# Patient Record
Sex: Male | Born: 1955 | Race: White | Hispanic: No | Marital: Married | Smoking: Former smoker
Health system: Southern US, Community
[De-identification: ages and names within clinical notes are randomized; demographics above are authoritative.]

## PROBLEM LIST (undated history)

## (undated) DIAGNOSIS — N201 Calculus of ureter: Secondary | ICD-10-CM

## (undated) DIAGNOSIS — I10 Essential (primary) hypertension: Secondary | ICD-10-CM

## (undated) DIAGNOSIS — D649 Anemia, unspecified: Secondary | ICD-10-CM

## (undated) DIAGNOSIS — E785 Hyperlipidemia, unspecified: Secondary | ICD-10-CM

## (undated) DIAGNOSIS — K429 Umbilical hernia without obstruction or gangrene: Secondary | ICD-10-CM

## (undated) DIAGNOSIS — K219 Gastro-esophageal reflux disease without esophagitis: Secondary | ICD-10-CM

## (undated) DIAGNOSIS — Z87442 Personal history of urinary calculi: Secondary | ICD-10-CM

## (undated) HISTORY — PX: COLONOSCOPY: SHX174

---

## 2007-09-22 ENCOUNTER — Emergency Department (HOSPITAL_COMMUNITY): Admission: EM | Admit: 2007-09-22 | Discharge: 2007-09-23 | Payer: Self-pay | Admitting: Emergency Medicine

## 2011-08-08 ENCOUNTER — Other Ambulatory Visit: Payer: Self-pay | Admitting: Urology

## 2011-08-10 ENCOUNTER — Encounter (HOSPITAL_COMMUNITY): Payer: Self-pay | Admitting: Pharmacy Technician

## 2011-08-15 ENCOUNTER — Encounter (HOSPITAL_COMMUNITY): Payer: Self-pay | Admitting: *Deleted

## 2011-08-15 NOTE — Progress Notes (Signed)
Spoke with patient's wife. Instructed no Aspirin products x 4 days prior and to take laxative 4/3/13pm

## 2011-08-15 NOTE — Progress Notes (Signed)
Spoke with patient's wife. She said he has been having diarrhea x 2 days. She will call Dr. Margarita Grizzle if he is still having diarrhea tomorrow re: should he take the laxative and use the enema 4/3/13pm

## 2011-08-18 ENCOUNTER — Ambulatory Visit (HOSPITAL_COMMUNITY): Payer: Managed Care, Other (non HMO)

## 2011-08-18 ENCOUNTER — Encounter (HOSPITAL_COMMUNITY): Payer: Self-pay | Admitting: *Deleted

## 2011-08-18 ENCOUNTER — Ambulatory Visit (HOSPITAL_COMMUNITY)
Admission: RE | Admit: 2011-08-18 | Discharge: 2011-08-18 | Disposition: A | Discharge status: Home / Self Care | Payer: Managed Care, Other (non HMO) | Source: Ambulatory Visit | Attending: Urology | Admitting: Urology

## 2011-08-18 ENCOUNTER — Encounter (HOSPITAL_COMMUNITY)
Admission: RE | Disposition: A | Discharge status: Home / Self Care | Payer: Self-pay | Source: Ambulatory Visit | Attending: Urology

## 2011-08-18 DIAGNOSIS — I10 Essential (primary) hypertension: Secondary | ICD-10-CM | POA: Insufficient documentation

## 2011-08-18 DIAGNOSIS — E119 Type 2 diabetes mellitus without complications: Secondary | ICD-10-CM | POA: Insufficient documentation

## 2011-08-18 DIAGNOSIS — N1330 Unspecified hydronephrosis: Secondary | ICD-10-CM | POA: Insufficient documentation

## 2011-08-18 DIAGNOSIS — N201 Calculus of ureter: Secondary | ICD-10-CM | POA: Insufficient documentation

## 2011-08-18 HISTORY — PX: EXTRACORPOREAL SHOCK WAVE LITHOTRIPSY: SHX1557

## 2011-08-18 HISTORY — DX: Essential (primary) hypertension: I10

## 2011-08-18 SURGERY — LITHOTRIPSY, ESWL
Anesthesia: LOCAL | Laterality: Left

## 2011-08-18 MED ORDER — OXYCODONE-ACETAMINOPHEN 5-325 MG PO TABS: 1.0000 | ORAL_TABLET | ORAL | Status: AC | PRN

## 2011-08-18 MED ORDER — DIAZEPAM 5 MG PO TABS
ORAL_TABLET | ORAL | Status: AC
  Administered 2011-08-18: 10 mg via ORAL
  Filled 2011-08-18: qty 2

## 2011-08-18 MED ORDER — DIPHENHYDRAMINE HCL 25 MG PO CAPS
25.0000 mg | ORAL_CAPSULE | ORAL | Status: AC
  Administered 2011-08-18: 25 mg via ORAL

## 2011-08-18 MED ORDER — CEFAZOLIN SODIUM-DEXTROSE 2-3 GM-% IV SOLR
INTRAVENOUS | Status: AC
  Administered 2011-08-18: 2 g via INTRAVENOUS
  Filled 2011-08-18: qty 50

## 2011-08-18 MED ORDER — DIPHENHYDRAMINE HCL 25 MG PO CAPS
ORAL_CAPSULE | ORAL | Status: AC
  Filled 2011-08-18: qty 1

## 2011-08-18 MED ORDER — DEXTROSE-NACL 5-0.45 % IV SOLN
INTRAVENOUS | Status: DC
  Administered 2011-08-18: 15:00:00 via INTRAVENOUS

## 2011-08-18 MED ORDER — CEFAZOLIN SODIUM-DEXTROSE 2-3 GM-% IV SOLR
2.0000 g | INTRAVENOUS | Status: AC
  Administered 2011-08-18: 2 g via INTRAVENOUS

## 2011-08-18 MED ORDER — DIAZEPAM 5 MG PO TABS
10.0000 mg | ORAL_TABLET | ORAL | Status: AC
  Administered 2011-08-18: 10 mg via ORAL

## 2011-08-18 NOTE — Discharge Instructions (Signed)
DISCHARGE INSTRUCTIONS FOR SHOCKWAVE LITHOTRIPSY ° °MEDICATIONS:  ° °1. DO NOT RESUME YOUR ASPIRIN, or any other medicines like ibuprofen, motrin, excedrin, advil, aleve, vitamin E, fish oil as these can all cause bleeding x 7 days. ° °2. Resume all your other meds from. ° °ACTIVITY °1. No strenuous activity x 1week °2. No driving while on narcotic pain medications °3. Drink plenty of water °4. Continue to walk at home - you can still get blood clots when you are at home, so keep active, but don't over do it. °5. May return to work in 1-3 days. ° °BATHING °You can shower or take a bath. ° ° °SIGNS/SYMPTOMS TO CALL: °1. Please call us if you have a fever greater than 101.5, uncontrolled  °nausea/vomiting, uncontrolled pain, dizziness, unable to urinate, chest pain, shortness of breath, leg swelling, leg pain, redness around wound, drainage from wound, or any other concerns or questions. ° °You can reach us at [phone]. ° ° ° °

## 2011-08-18 NOTE — H&P (Signed)
Urology History and Physical Exam  CC: Left proximal ureter stone.  HPI: 56 year old male found to have a left proximal ureter stone during a work up for microscopic hematuria. It measures 7.6 x 9.2 mm. The stone was also visible on a KUB.  He was not having flank pain, but he did have some dyuria and urinary frequency. We discussed management options and he presents today for left SWL of the proximal ureter stone. If this stone breaks up well, we may then target the left lower pole stone. We have discussed the risks, benefits, alternatives, and likelihood of achieving goals.  Urine culture from 08/05/11 was negative.    PMH: Past Medical History  Diagnosis Date  . Hypertension   . Diabetes mellitus   . Chronic kidney disease     left ureteral stone    PSH: History reviewed. No pertinent past surgical history.  Allergies: Allergies  Allergen Reactions  . Ciprofloxacin Hives    Medications: No prescriptions prior to admission     Social History: History   Social History  . Marital Status: Married    Spouse Name: N/A    Number of Children: N/A  . Years of Education: N/A   Occupational History  . Not on file.   Social History Main Topics  . Smoking status: Former Games developer  . Smokeless tobacco: Not on file  . Alcohol Use: Yes     rare  . Drug Use: No  . Sexually Active:    Other Topics Concern  . Not on file   Social History Narrative  . No narrative on file    Family History: History reviewed. No pertinent family history.  Review of Systems: Positive: Pain in left flank. Negative: Fever, SOB, chest pain.  A further 10 point review of systems was negative except what is listed in the HPI.  Physical Exam:  General: No acute distress.  Awake. Head:  Normocephalic.  Atraumatic. ENT:  EOMI.  Mucous membranes moist Neck:  Supple.  No lymphadenopathy. CV:  S1 present. S2 present. Regular rate. Pulmonary: Equal effort bilaterally.  Clear to auscultation  bilaterally. Abdomen: Soft.  Non- tender to palpation. Skin:  Normal turgor.  No visible rash. Extremity: No gross deformity of bilateral upper extremities.  No gross deformity of    bilateral lower extremities. Neurologic: Alert. Appropriate mood.    Studies:  No results found for this basename: HGB:2,WBC:2,PLT:2 in the last 72 hours  No results found for this basename: NA:2,K:2,CL:2,CO2:2,BUN:2,CREATININE:2,CALCIUM:2,MAGNESIUM:2,GFRNONAA:2,GFRAA:2 in the last 72 hours   No results found for this basename: PT:2,INR:2,APTT:2 in the last 72 hours   No components found with this basename: ABG:2    Assessment:  Left proximal ureter stone  Plan: Shockwave lithotripsy of the left proximal ureter stone.

## 2011-08-18 NOTE — Brief Op Note (Signed)
08/18/2011  5:15 PM  PATIENT:  Austin Gould  56 y.o. male  PRE-OPERATIVE DIAGNOSIS:  LEFT URETERAL STONE  POST-OPERATIVE DIAGNOSIS: Left ureter stone  PROCEDURE:  Procedure(s) (LRB): EXTRACORPOREAL SHOCK WAVE LITHOTRIPSY (ESWL) (Left)  SURGEON:  Surgeon(s) and Role:    * Milford Cage, MD - Primary  PHYSICIAN ASSISTANT:   ASSISTANTS: none   ANESTHESIA:   IV sedation  EBL:   None  BLOOD ADMINISTERED:none  DRAINS: none   LOCAL MEDICATIONS USED:  NONE  SPECIMEN:  No Specimen  DISPOSITION OF SPECIMEN:  N/A  COUNTS:  YES  TOURNIQUET:  * No tourniquets in log *  DICTATION: .Note written in paper chart  PLAN OF CARE: Discharge to home after PACU  PATIENT DISPOSITION:  PACU - hemodynamically stable.   Delay start of Pharmacological VTE agent (>24hrs) due to surgical blood loss or risk of bleeding: yes

## 2011-08-19 ENCOUNTER — Encounter (HOSPITAL_COMMUNITY): Payer: Self-pay

## 2011-09-09 ENCOUNTER — Other Ambulatory Visit: Payer: Self-pay | Admitting: Urology

## 2011-10-13 ENCOUNTER — Encounter (HOSPITAL_BASED_OUTPATIENT_CLINIC_OR_DEPARTMENT_OTHER): Payer: Self-pay | Admitting: *Deleted

## 2011-10-13 NOTE — Progress Notes (Signed)
NPO AFTER MN. ARRIVES AT 0715. NEEDS ISTAT AND EKG. WILL TAKE PEPCID AM OF SURG  W/ SIP OF WATER.

## 2011-10-19 ENCOUNTER — Encounter (HOSPITAL_BASED_OUTPATIENT_CLINIC_OR_DEPARTMENT_OTHER): Payer: Self-pay | Admitting: Anesthesiology

## 2011-10-19 ENCOUNTER — Encounter (HOSPITAL_BASED_OUTPATIENT_CLINIC_OR_DEPARTMENT_OTHER)
Admission: RE | Disposition: A | Discharge status: Home / Self Care | Payer: Self-pay | Source: Ambulatory Visit | Attending: Urology

## 2011-10-19 ENCOUNTER — Ambulatory Visit (HOSPITAL_BASED_OUTPATIENT_CLINIC_OR_DEPARTMENT_OTHER)
Admission: RE | Admit: 2011-10-19 | Discharge: 2011-10-19 | Disposition: A | Discharge status: Home / Self Care | Payer: Managed Care, Other (non HMO) | Source: Ambulatory Visit | Attending: Urology | Admitting: Urology

## 2011-10-19 ENCOUNTER — Ambulatory Visit (HOSPITAL_BASED_OUTPATIENT_CLINIC_OR_DEPARTMENT_OTHER): Payer: Managed Care, Other (non HMO) | Admitting: Anesthesiology

## 2011-10-19 ENCOUNTER — Encounter (HOSPITAL_BASED_OUTPATIENT_CLINIC_OR_DEPARTMENT_OTHER): Payer: Self-pay | Admitting: *Deleted

## 2011-10-19 DIAGNOSIS — N200 Calculus of kidney: Secondary | ICD-10-CM | POA: Insufficient documentation

## 2011-10-19 DIAGNOSIS — Z87891 Personal history of nicotine dependence: Secondary | ICD-10-CM | POA: Insufficient documentation

## 2011-10-19 DIAGNOSIS — N201 Calculus of ureter: Secondary | ICD-10-CM | POA: Insufficient documentation

## 2011-10-19 DIAGNOSIS — K219 Gastro-esophageal reflux disease without esophagitis: Secondary | ICD-10-CM | POA: Insufficient documentation

## 2011-10-19 DIAGNOSIS — E785 Hyperlipidemia, unspecified: Secondary | ICD-10-CM | POA: Insufficient documentation

## 2011-10-19 DIAGNOSIS — I10 Essential (primary) hypertension: Secondary | ICD-10-CM | POA: Insufficient documentation

## 2011-10-19 DIAGNOSIS — E119 Type 2 diabetes mellitus without complications: Secondary | ICD-10-CM | POA: Insufficient documentation

## 2011-10-19 HISTORY — PX: CYSTOSCOPY WITH URETEROSCOPY: SHX5123

## 2011-10-19 HISTORY — DX: Gastro-esophageal reflux disease without esophagitis: K21.9

## 2011-10-19 HISTORY — DX: Hyperlipidemia, unspecified: E78.5

## 2011-10-19 HISTORY — DX: Umbilical hernia without obstruction or gangrene: K42.9

## 2011-10-19 LAB — POCT I-STAT 4, (NA,K, GLUC, HGB,HCT): Sodium: 142 mEq/L (ref 135–145)

## 2011-10-19 SURGERY — CYSTOSCOPY WITH URETEROSCOPY
Anesthesia: General | Site: Ureter | Laterality: Left | Wound class: Clean Contaminated

## 2011-10-19 MED ORDER — LIDOCAINE HCL 2 % EX GEL
CUTANEOUS | Status: DC | PRN
  Administered 2011-10-19: 1

## 2011-10-19 MED ORDER — IOHEXOL 350 MG/ML SOLN
INTRAVENOUS | Status: DC | PRN
  Administered 2011-10-19: 1 mL

## 2011-10-19 MED ORDER — FENTANYL CITRATE 0.05 MG/ML IJ SOLN
25.0000 ug | INTRAMUSCULAR | Status: DC | PRN
  Administered 2011-10-19 (×2): 25 ug via INTRAVENOUS

## 2011-10-19 MED ORDER — KETOROLAC TROMETHAMINE 30 MG/ML IJ SOLN
15.0000 mg | Freq: Once | INTRAMUSCULAR | Status: AC | PRN
  Administered 2011-10-19: 30 mg via INTRAVENOUS

## 2011-10-19 MED ORDER — SENNOSIDES-DOCUSATE SODIUM 8.6-50 MG PO TABS: 1.0000 | ORAL_TABLET | Freq: Two times a day (BID) | ORAL | Status: AC

## 2011-10-19 MED ORDER — CEPHALEXIN 500 MG PO CAPS: 500.0000 mg | ORAL_CAPSULE | Freq: Three times a day (TID) | ORAL | Status: AC

## 2011-10-19 MED ORDER — PROMETHAZINE HCL 25 MG/ML IJ SOLN: 6.2500 mg | INTRAMUSCULAR | Status: DC | PRN

## 2011-10-19 MED ORDER — CEFAZOLIN SODIUM-DEXTROSE 2-3 GM-% IV SOLR
2.0000 g | INTRAVENOUS | Status: AC
  Administered 2011-10-19: 2 g via INTRAVENOUS

## 2011-10-19 MED ORDER — PHENAZOPYRIDINE HCL 100 MG PO TABS: 100.0000 mg | ORAL_TABLET | Freq: Three times a day (TID) | ORAL | Status: AC | PRN

## 2011-10-19 MED ORDER — OXYBUTYNIN CHLORIDE 5 MG PO TABS: 5.0000 mg | ORAL_TABLET | Freq: Four times a day (QID) | ORAL | Status: DC | PRN

## 2011-10-19 MED ORDER — LIDOCAINE HCL (CARDIAC) 20 MG/ML IV SOLN
INTRAVENOUS | Status: DC | PRN
  Administered 2011-10-19: 100 mg via INTRAVENOUS

## 2011-10-19 MED ORDER — SODIUM CHLORIDE 0.9 % IR SOLN
Status: DC | PRN
  Administered 2011-10-19: 6000 mL

## 2011-10-19 MED ORDER — DEXAMETHASONE SODIUM PHOSPHATE 4 MG/ML IJ SOLN
INTRAMUSCULAR | Status: DC | PRN
  Administered 2011-10-19: 10 mg via INTRAVENOUS

## 2011-10-19 MED ORDER — FENTANYL CITRATE 0.05 MG/ML IJ SOLN
INTRAMUSCULAR | Status: DC | PRN
  Administered 2011-10-19 (×2): 50 ug via INTRAVENOUS
  Administered 2011-10-19: 25 ug via INTRAVENOUS
  Administered 2011-10-19: 50 ug via INTRAVENOUS
  Administered 2011-10-19: 25 ug via INTRAVENOUS
  Administered 2011-10-19 (×2): 50 ug via INTRAVENOUS

## 2011-10-19 MED ORDER — OXYCODONE-ACETAMINOPHEN 5-325 MG PO TABS
1.0000 | ORAL_TABLET | Freq: Once | ORAL | Status: AC
  Administered 2011-10-19: 1 via ORAL

## 2011-10-19 MED ORDER — LACTATED RINGERS IV SOLN
INTRAVENOUS | Status: DC
  Administered 2011-10-19 (×2): via INTRAVENOUS

## 2011-10-19 MED ORDER — PROPOFOL 10 MG/ML IV EMUL
INTRAVENOUS | Status: DC | PRN
  Administered 2011-10-19: 200 mg via INTRAVENOUS

## 2011-10-19 MED ORDER — MIDAZOLAM HCL 5 MG/5ML IJ SOLN
INTRAMUSCULAR | Status: DC | PRN
  Administered 2011-10-19: 2 mg via INTRAVENOUS

## 2011-10-19 MED ORDER — ONDANSETRON HCL 4 MG/2ML IJ SOLN
INTRAMUSCULAR | Status: DC | PRN
  Administered 2011-10-19: 4 mg via INTRAVENOUS

## 2011-10-19 MED ORDER — OXYCODONE-ACETAMINOPHEN 5-325 MG PO TABS: 1.0000 | ORAL_TABLET | ORAL | Status: AC | PRN

## 2011-10-19 MED ORDER — CEFAZOLIN SODIUM 1-5 GM-% IV SOLN: 1.0000 g | INTRAVENOUS | Status: DC

## 2011-10-19 SURGICAL SUPPLY — 35 items
ADAPTER CATH URET PLST 4-6FR (CATHETERS) IMPLANT
BAG DRAIN URO-CYSTO SKYTR STRL (DRAIN) ×2 IMPLANT
BASKET LASER NITINOL 1.9FR (BASKET) IMPLANT
BASKET STNLS GEMINI 4WIRE 3FR (BASKET) IMPLANT
BASKET ZERO TIP NITINOL 2.4FR (BASKET) ×2 IMPLANT
BRUSH URET BIOPSY 3F (UROLOGICAL SUPPLIES) IMPLANT
CANISTER SUCT LVC 12 LTR MEDI- (MISCELLANEOUS) ×2 IMPLANT
CATH CLEAR GEL 3F BACKSTOP (CATHETERS) ×2 IMPLANT
CATH INTERMIT  6FR 70CM (CATHETERS) IMPLANT
CATH URET 5FR 28IN CONE TIP (BALLOONS)
CATH URET 5FR 28IN OPEN ENDED (CATHETERS) IMPLANT
CATH URET 5FR 70CM CONE TIP (BALLOONS) IMPLANT
CLOTH BEACON ORANGE TIMEOUT ST (SAFETY) ×2 IMPLANT
DRAPE CAMERA CLOSED 9X96 (DRAPES) ×2 IMPLANT
ELECT REM PT RETURN 9FT ADLT (ELECTROSURGICAL)
ELECTRODE REM PT RTRN 9FT ADLT (ELECTROSURGICAL) IMPLANT
GLOVE ECLIPSE 7.0 STRL STRAW (GLOVE) ×2 IMPLANT
GLOVE INDICATOR 7.0 STRL GRN (GLOVE) ×4 IMPLANT
GLOVE INDICATOR 7.5 STRL GRN (GLOVE) ×2 IMPLANT
GOWN PREVENTION PLUS LG XLONG (DISPOSABLE) ×2 IMPLANT
GUIDEWIRE 0.038 PTFE COATED (WIRE) IMPLANT
GUIDEWIRE ANG ZIPWIRE 038X150 (WIRE) IMPLANT
GUIDEWIRE STR DUAL SENSOR (WIRE) ×2 IMPLANT
IV NS IRRIG 3000ML ARTHROMATIC (IV SOLUTION) ×4 IMPLANT
KIT BALLIN UROMAX 15FX10 (LABEL) IMPLANT
KIT BALLN UROMAX 15FX4 (MISCELLANEOUS) IMPLANT
KIT BALLN UROMAX 26 75X4 (MISCELLANEOUS)
LASER FIBER DISP (UROLOGICAL SUPPLIES) ×2 IMPLANT
PACK CYSTOSCOPY (CUSTOM PROCEDURE TRAY) ×2 IMPLANT
SET HIGH PRES BAL DIL (LABEL)
SHEATH ACCESS URETERAL 38CM (SHEATH) ×2 IMPLANT
SHEATH URET ACCESS 12FR/35CM (UROLOGICAL SUPPLIES) IMPLANT
SHEATH URET ACCESS 12FR/55CM (UROLOGICAL SUPPLIES) IMPLANT
STENT URET 6FRX26 CONTOUR (STENTS) ×2 IMPLANT
SYRINGE IRR TOOMEY STRL 70CC (SYRINGE) IMPLANT

## 2011-10-19 NOTE — Discharge Instructions (Signed)
DISCHARGE INSTRUCTIONS FOR KIDNEY STONES OR URETERAL STENT  MIEDICATION:  You will have an antibiotic. Start this the day of surgery and continue it for 3 days with the last day being Friday, 10-21-11.  Restart the antibiotic on Wednesday, 10-26-11.  ACTIVITY 1. No strenuous activity x 1week 2. No driving while on narcotic pain medications 3. Drink plenty of water 4. Continue to walk at home - you can still get blood clots when you are at home, so keep active, but don't over do it. 5. May return to work in 3 days.  BATHING 1. You can shower.   SIGNS/SYMPTOMS TO CALL: 1. Please call us if you have a fever greater than 101.5, uncontrolled  nausea/vomiting, uncontrolled pain, dizziness, unable to urinate, chest pain, shortness of breath, leg swelling, leg pain, redness around wound, drainage from wound, or any other concerns or questions.  You can reach [place] at [phone].   Post Anesthesia Home Care Instructions  Activity: Get plenty of rest for the remainder of the day. A responsible adult should stay with you for 24 hours following the procedure.  For the next 24 hours, DO NOT: -Drive a car -Advertising copywriter -Drink alcoholic beverages -Take any medication unless instructed by your physician -Make any legal decisions or sign important papers.  Meals: Start with liquid foods such as gelatin or soup. Progress to regular foods as tolerated. Avoid greasy, spicy, heavy foods. If nausea and/or vomiting occur, drink only clear liquids until the nausea and/or vomiting subsides. Call your physician if vomiting continues.  Special Instructions/Symptoms: Your throat may feel dry or sore from the anesthesia or the breathing tube placed in your throat during surgery. If this causes discomfort, gargle with warm salt water. The discomfort should disappear within 24 hours.

## 2011-10-19 NOTE — Anesthesia Procedure Notes (Signed)
Procedure Name: LMA Insertion Date/Time: 10/19/2011 8:40 AM Performed by: Norva Pavlov Pre-anesthesia Checklist: Patient identified, Emergency Drugs available, Suction available and Patient being monitored Patient Re-evaluated:Patient Re-evaluated prior to inductionOxygen Delivery Method: Circle System Utilized Preoxygenation: Pre-oxygenation with 100% oxygen Intubation Type: IV induction Ventilation: Mask ventilation without difficulty LMA: LMA inserted LMA Size: 4.0 Number of attempts: 1 Airway Equipment and Method: bite block Placement Confirmation: positive ETCO2 Tube secured with: Tape Dental Injury: Teeth and Oropharynx as per pre-operative assessment

## 2011-10-19 NOTE — Anesthesia Postprocedure Evaluation (Signed)
  Anesthesia Post-op Note  Patient: Austin Gould  Procedure(s) Performed: Procedure(s) (LRB): CYSTOSCOPY WITH URETEROSCOPY (Left) HOLMIUM LASER APPLICATION (Left)  Patient Location: PACU  Anesthesia Type: General  Level of Consciousness: awake and alert   Airway and Oxygen Therapy: Patient Spontanous Breathing  Post-op Pain: mild  Post-op Assessment: Post-op Vital signs reviewed, Patient's Cardiovascular Status Stable, Respiratory Function Stable, Patent Airway and No signs of Nausea or vomiting  Post-op Vital Signs: stable  Complications: No apparent anesthesia complications

## 2011-10-19 NOTE — Transfer of Care (Signed)
Immediate Anesthesia Transfer of Care Note  Patient: Austin Gould  Procedure(s) Performed: Procedure(s) (LRB): CYSTOSCOPY WITH URETEROSCOPY (Left) HOLMIUM LASER APPLICATION (Left)  Patient Location: PACU  Anesthesia Type: General  Level of Consciousness: drowsy, arouses to name, follows commands  Airway & Oxygen Therapy: Patient Spontanous Breathing and Patient connected to face mask oxygen  Post-op Assessment: Report given to PACU RN and Post -op Vital signs reviewed and stable  Post vital signs: Reviewed and stable  Complications: No apparent anesthesia complications

## 2011-10-19 NOTE — Brief Op Note (Signed)
10/19/2011  10:02 AM  PATIENT:  Austin Gould  56 y.o. male  PRE-OPERATIVE DIAGNOSIS:  Left Ureter Stone  POST-OPERATIVE DIAGNOSIS:  Left Ureter Stone  PROCEDURE:  Procedure(s) (LRB): CYSTOSCOPY  Left URETEROSCOPY HOLMIUM LASER Lithotripsy Left ureter stent placement Fluoroscopy  SURGEON:  Surgeon(s) and Role:    * Milford Cage, MD - Primary  PHYSICIAN ASSISTANT:   ASSISTANTS: none   ANESTHESIA:   general  EBL: None Total I/O In: 1000 [I.V.:1000] Out: -   BLOOD ADMINISTERED:none  DRAINS: none   LOCAL MEDICATIONS USED:  Amount: 10 ml lidocaine urethral jelly.  SPECIMEN:  Source of Specimen:  left ureter and left renal stone.  DISPOSITION OF SPECIMEN:  Given to family.  COUNTS:  YES  TOURNIQUET:  * No tourniquets in log *  DICTATION: .Other Dictation: Dictation Number [id number]  PLAN OF CARE: Discharge to home after PACU  PATIENT DISPOSITION:  PACU - hemodynamically stable.   Delay start of Pharmacological VTE agent (>24hrs) due to surgical blood loss or risk of bleeding: no

## 2011-10-19 NOTE — H&P (Signed)
Urology History and Physical Exam  CC: Left proximal ureter stone.  HPI:    56 year old male presents for left ureteroscopy for left ureter stone. It was 1.5 cm in size and was located in the proximal ureter. He had SWL 08/18/11, which decreased the size of the stone, but it remained intact.  We discussed the risks, benefits, alternatives, and likelihood of achieving those with ureteroscopy. UA at last visit was negative for signs of infection.   PMH: Past Medical History  Diagnosis Date  . Hypertension   . Diabetes mellitus   . Left ureteral calculus   . Hyperlipidemia   . GERD (gastroesophageal reflux disease)   . Umbilical hernia   . Microhematuria     PSH: Past Surgical History  Procedure Date  . Extracorporeal shock wave lithotripsy 08-18-2011    LEFT    Allergies: Allergies  Allergen Reactions  . Ciprofloxacin Hives    Medications: No prescriptions prior to admission     Social History: History   Social History  . Marital Status: Married    Spouse Name: N/A    Number of Children: N/A  . Years of Education: N/A   Occupational History  . Not on file.   Social History Main Topics  . Smoking status: Former Smoker -- 25 years  . Smokeless tobacco: Never Used   Comment: occasional smoker as teen  . Alcohol Use: Yes     rare  . Drug Use: No  . Sexually Active:    Other Topics Concern  . Not on file   Social History Narrative  . No narrative on file    Family History: History reviewed. No pertinent family history.  Review of Systems: Positive: Diarrhea, nausea. Negative: Chest pain, SOB, fever.  A further 10 point review of systems was negative except what is listed in the HPI.  Physical Exam:  General: No acute distress.  Awake. Head:  Normocephalic.  Atraumatic. ENT:  EOMI.  Mucous membranes moist Neck:  Supple.  No lymphadenopathy. CV:  S1 present. S2 present. Regular rate. Pulmonary: Equal effort bilaterally.  Clear to auscultation  bilaterally. Abdomen: Soft.  Non- tender to palpation. Skin:  Normal turgor.  No visible rash. Extremity: No gross deformity of bilateral upper extremities.  No gross deformity of    bilateral lower extremities. Neurologic: Alert. Appropriate mood.  Studies:  No results found for this basename: HGB:2,WBC:2,PLT:2 in the last 72 hours  No results found for this basename: NA:2,K:2,CL:2,CO2:2,BUN:2,CREATININE:2,CALCIUM:2,MAGNESIUM:2,GFRNONAA:2,GFRAA:2 in the last 72 hours   No results found for this basename: PT:2,INR:2,APTT:2 in the last 72 hours   No components found with this basename: ABG:2    Assessment:  Left proximal ureter stone.  Plan: -To OR for cystoscopy, left ureteroscopy, laser lithotripsy, possible left ureter stent placement.

## 2011-10-19 NOTE — Anesthesia Preprocedure Evaluation (Addendum)
Anesthesia Evaluation  Patient identified by MRN, date of birth, ID band Patient awake    Reviewed: Allergy & Precautions, H&P , NPO status , Patient's Chart, lab work & pertinent test results  Airway Mallampati: II TM Distance: <3 FB Neck ROM: Full    Dental No notable dental hx.    Pulmonary neg pulmonary ROS,  breath sounds clear to auscultation  Pulmonary exam normal       Cardiovascular hypertension, Pt. on medications Rhythm:Regular Rate:Normal     Neuro/Psych negative neurological ROS  negative psych ROS   GI/Hepatic negative GI ROS, Neg liver ROS,   Endo/Other  Diabetes mellitus-, Type 2, Oral Hypoglycemic Agents  Renal/GU negative Renal ROS  negative genitourinary   Musculoskeletal negative musculoskeletal ROS (+)   Abdominal   Peds negative pediatric ROS (+)  Hematology negative hematology ROS (+)   Anesthesia Other Findings   Reproductive/Obstetrics negative OB ROS                          Anesthesia Physical Anesthesia Plan  ASA: II  Anesthesia Plan: General   Post-op Pain Management:    Induction: Intravenous  Airway Management Planned: LMA  Additional Equipment:   Intra-op Plan:   Post-operative Plan:   Informed Consent: I have reviewed the patients History and Physical, chart, labs and discussed the procedure including the risks, benefits and alternatives for the proposed anesthesia with the patient or authorized representative who has indicated his/her understanding and acceptance.   Dental advisory given  Plan Discussed with: CRNA  Anesthesia Plan Comments:         Anesthesia Quick Evaluation

## 2011-10-20 NOTE — Op Note (Signed)
NAME:  Austin Gould, Austin Gould NO.:  [id number]  MEDICAL RECORD NO.:  [id number]  LOCATION:                               FACILITY:  [facility]  PHYSICIAN:  Natalia Leatherwood, MD    DATE OF BIRTH:  [birth year]  DATE OF PROCEDURE:10/19/2011 DATE OF DISCHARGE:                              OPERATIVE REPORT   SURGEON:  Natalia Leatherwood, MD.  ASSISTANT:  None.  PREOPERATIVE DIAGNOSES:  Left ureteral stone and left nephrolithiasis.  POSTOPERATIVE DIAGNOSES:  Left ureteral stone and left nephrolithiasis.  PROCEDURE PERFORMED: 1. Cystoscopy. 2. Left ureteroscopy. 3. Laser lithotripsy. 4. Basket stone retrieval. 5. Left ureteral stent placement. 6. Fluoroscopy with interpretation less than 1 hour.  COMPLICATIONS:  None.  ESTIMATED BLOOD LOSS:  None.  FINDINGS:  Impacted left proximal ureteral stone and left lower pole renal stone.  HISTORY OF PRESENT ILLNESS:  This is a 56 year old gentleman who was found to have a left proximal ureteral stone.  He underwent shockwave lithotripsy; however, the stone remained in place and we discussed further treatment options.  He elected to have ureteroscopy and presents today for that elective procedure.  PROCEDURE:  Informed consent was obtained.  The patient was taken to the operating room.  He was placed in a supine position.  SCDs were placed on his legs and turned on.  IV antibiotics were infused and general anesthesia was induced.  He was then placed in a dorsal lithotomy position making sure to pad all pertinent neurovascular pressure points appropriately.  Following this, his genitals were prepped and draped in usual sterile fashion.  A time-out was performed, which the correct patient, surgical site, and procedure were all identified and agreed upon by the team.  Rigid cystoscope was then advanced through the urethra into the bladder.  The left ureteral orifice was immediately visible and it was cannulated with a sensor tip wire.   The wire was able to be placed past the stone, which was visible on fluoroscopy and with a good curl in the renal pelvis.  A second wire was more difficult to place past the stone, but eventually was able to be passed beyond the stone with a curl in the renal pelvis.  After this was done, the bladder was drained.  One wire was secured as a safety wire, the other was used as a working wire.  A 12-14-French ureteral access sheath was then placed under fluoroscopic guidance with ease up to the stone.  The obturator and wire were removed and then the digital ureteroscope was placed up the access sheath.  I had wanted to use BackStop Gel to place beyond the stone to prevent fragments from going into the kidney; however, the BackStop Gel tube would not fit around the stone because it was so tight.  Therefore, 200 micron holmium laser filament was placed through the ureteroscope and lithotripsy was carried out at 0.5 joules and 20 Hz.  As the stone broke up, I removed fragments with a ZeroTip Nitinol basket.  Further lithotripsy was performed in order to break up the entirety of the stone.  It was noted that the stone was impacted in the ureter with large amount of edema  around the impaction site.  As lithotripsy was carried out, the stone fragments were freed and I cleared the obstruction.  It was noted that the safety wire was submucosal in the ureter for a very short distance, approximately 1 cm. The wire was in the true lumen of the ureter beyond this 1 cm and into the kidney.  Next, I took the ureteroscope into the kidney and evaluated all of the calyces in a systematic fashion.  In the lower pole of the kidney, I found the remaining 7 mm stone and lithotripsy was carried out upon the stone.  The ZeroTip Nitinol basket was used to remove all fragments that were larger than the size of the sensor tip wire.  It remained a large amount of stone debris that were all smaller than the wire that  were unable to be grasped with the ZeroTip Nitinol basket. After this was done, a sensor tip wire was placed through the ureteroscope into the kidney and then the ureteroscope and ureteral access sheath were removed slowly visualizing the entirety of the ureter.  There was no injury to the mucosa of the ureter noted upon Removal other than the short submucosal tunnel from the wire in the proximal ureter.  After this, the original safety wires were removed leaving the wire in place that was placed through the ureteroscope.  This wire was backloaded onto the cystoscope and a 6 x 26 double-J ureteral stent without the strings in place was placed up over the wire under fluoroscopy and direct visualization with good curl noted in the left renal pelvis and a good curl deployed into the bladder.  The bladder was then drained and 10 cc of lidocaine jelly were placed into the urethra. A B&O suppository was placed into his rectum.  This completed the procedure.  He was placed back in supine position.  The stone fragments were removed and provided to his wife.  The patient will be taken to PACU in stable condition.  He will be discharged to home.          ______________________________ Natalia Leatherwood, MD     DW/MEDQ  D:  10/19/2011  T:  10/20/2011  Job:  [id number]

## 2011-10-24 ENCOUNTER — Encounter (HOSPITAL_BASED_OUTPATIENT_CLINIC_OR_DEPARTMENT_OTHER): Payer: Self-pay | Admitting: Urology

## 2011-10-25 ENCOUNTER — Encounter (HOSPITAL_BASED_OUTPATIENT_CLINIC_OR_DEPARTMENT_OTHER): Payer: Self-pay

## 2016-10-04 ENCOUNTER — Other Ambulatory Visit: Payer: Self-pay | Admitting: Urology

## 2016-10-04 ENCOUNTER — Encounter (HOSPITAL_COMMUNITY): Payer: Self-pay

## 2016-10-04 ENCOUNTER — Inpatient Hospital Stay (HOSPITAL_COMMUNITY): Payer: Managed Care, Other (non HMO) | Admitting: Anesthesiology

## 2016-10-04 ENCOUNTER — Encounter (HOSPITAL_COMMUNITY)
Admission: EM | Disposition: A | Discharge status: Home / Self Care | Payer: Self-pay | Source: Home / Self Care | Attending: Emergency Medicine

## 2016-10-04 ENCOUNTER — Inpatient Hospital Stay (HOSPITAL_COMMUNITY): Payer: Managed Care, Other (non HMO)

## 2016-10-04 ENCOUNTER — Observation Stay (HOSPITAL_COMMUNITY)
Admission: EM | Admit: 2016-10-04 | Discharge: 2016-10-05 | Disposition: A | Discharge status: Home / Self Care | Payer: Managed Care, Other (non HMO) | Attending: Internal Medicine | Admitting: Internal Medicine

## 2016-10-04 ENCOUNTER — Emergency Department (HOSPITAL_COMMUNITY): Payer: Managed Care, Other (non HMO)

## 2016-10-04 DIAGNOSIS — Z881 Allergy status to other antibiotic agents status: Secondary | ICD-10-CM | POA: Insufficient documentation

## 2016-10-04 DIAGNOSIS — N132 Hydronephrosis with renal and ureteral calculous obstruction: Principal | ICD-10-CM | POA: Insufficient documentation

## 2016-10-04 DIAGNOSIS — Z87442 Personal history of urinary calculi: Secondary | ICD-10-CM | POA: Insufficient documentation

## 2016-10-04 DIAGNOSIS — I10 Essential (primary) hypertension: Secondary | ICD-10-CM | POA: Diagnosis not present

## 2016-10-04 DIAGNOSIS — K219 Gastro-esophageal reflux disease without esophagitis: Secondary | ICD-10-CM | POA: Diagnosis not present

## 2016-10-04 DIAGNOSIS — E785 Hyperlipidemia, unspecified: Secondary | ICD-10-CM | POA: Diagnosis not present

## 2016-10-04 DIAGNOSIS — Z9889 Other specified postprocedural states: Secondary | ICD-10-CM | POA: Diagnosis not present

## 2016-10-04 DIAGNOSIS — K429 Umbilical hernia without obstruction or gangrene: Secondary | ICD-10-CM | POA: Diagnosis not present

## 2016-10-04 DIAGNOSIS — N201 Calculus of ureter: Secondary | ICD-10-CM

## 2016-10-04 DIAGNOSIS — Z87891 Personal history of nicotine dependence: Secondary | ICD-10-CM | POA: Insufficient documentation

## 2016-10-04 DIAGNOSIS — N179 Acute kidney failure, unspecified: Secondary | ICD-10-CM | POA: Diagnosis not present

## 2016-10-04 DIAGNOSIS — Z7984 Long term (current) use of oral hypoglycemic drugs: Secondary | ICD-10-CM | POA: Diagnosis not present

## 2016-10-04 DIAGNOSIS — Z8041 Family history of malignant neoplasm of ovary: Secondary | ICD-10-CM | POA: Diagnosis not present

## 2016-10-04 DIAGNOSIS — Z7982 Long term (current) use of aspirin: Secondary | ICD-10-CM | POA: Diagnosis not present

## 2016-10-04 DIAGNOSIS — K760 Fatty (change of) liver, not elsewhere classified: Secondary | ICD-10-CM | POA: Insufficient documentation

## 2016-10-04 DIAGNOSIS — E119 Type 2 diabetes mellitus without complications: Secondary | ICD-10-CM | POA: Insufficient documentation

## 2016-10-04 DIAGNOSIS — N400 Enlarged prostate without lower urinary tract symptoms: Secondary | ICD-10-CM | POA: Insufficient documentation

## 2016-10-04 DIAGNOSIS — Z882 Allergy status to sulfonamides status: Secondary | ICD-10-CM | POA: Insufficient documentation

## 2016-10-04 HISTORY — PX: CYSTOSCOPY W/ URETERAL STENT PLACEMENT: SHX1429

## 2016-10-04 HISTORY — DX: Calculus of ureter: N20.1

## 2016-10-04 HISTORY — DX: Personal history of urinary calculi: Z87.442

## 2016-10-04 LAB — I-STAT CHEM 8, ED
BUN: 22 mg/dL — ABNORMAL HIGH (ref 6–20)
Calcium, Ion: 1.23 mmol/L (ref 1.15–1.40)
Chloride: 101 mmol/L (ref 101–111)
Creatinine, Ser: 1.6 mg/dL — ABNORMAL HIGH (ref 0.61–1.24)
Glucose, Bld: 249 mg/dL — ABNORMAL HIGH (ref 65–99)
HCT: 38 % — ABNORMAL LOW (ref 39.0–52.0)
Hemoglobin: 12.9 g/dL — ABNORMAL LOW (ref 13.0–17.0)
Potassium: 4.6 mmol/L (ref 3.5–5.1)
Sodium: 137 mmol/L (ref 135–145)
TCO2: 23 mmol/L (ref 0–100)

## 2016-10-04 LAB — CBC
HCT: 40.3 % (ref 39.0–52.0)
Hemoglobin: 12.8 g/dL — ABNORMAL LOW (ref 13.0–17.0)
MCH: 26.6 pg (ref 26.0–34.0)
MCHC: 31.8 g/dL (ref 30.0–36.0)
MCV: 83.6 fL (ref 78.0–100.0)
PLATELETS: 287 10*3/uL (ref 150–400)
RBC: 4.82 MIL/uL (ref 4.22–5.81)
RDW: 13.7 % (ref 11.5–15.5)
WBC: 13.3 10*3/uL — AB (ref 4.0–10.5)

## 2016-10-04 LAB — URINALYSIS, ROUTINE W REFLEX MICROSCOPIC
BACTERIA UA: NONE SEEN
BILIRUBIN URINE: NEGATIVE
Glucose, UA: 50 mg/dL — AB
Ketones, ur: NEGATIVE mg/dL
NITRITE: NEGATIVE
Protein, ur: NEGATIVE mg/dL
Specific Gravity, Urine: 1.011 (ref 1.005–1.030)
pH: 5 (ref 5.0–8.0)

## 2016-10-04 LAB — GLUCOSE, CAPILLARY
GLUCOSE-CAPILLARY: 185 mg/dL — AB (ref 65–99)
GLUCOSE-CAPILLARY: 196 mg/dL — AB (ref 65–99)
GLUCOSE-CAPILLARY: 198 mg/dL — AB (ref 65–99)
Glucose-Capillary: 224 mg/dL — ABNORMAL HIGH (ref 65–99)

## 2016-10-04 LAB — COMPREHENSIVE METABOLIC PANEL
ALT: 18 U/L (ref 17–63)
AST: 19 U/L (ref 15–41)
Albumin: 3.9 g/dL (ref 3.5–5.0)
Alkaline Phosphatase: 35 U/L — ABNORMAL LOW (ref 38–126)
Anion gap: 11 (ref 5–15)
BUN: 21 mg/dL — ABNORMAL HIGH (ref 6–20)
CALCIUM: 9.5 mg/dL (ref 8.9–10.3)
CO2: 24 mmol/L (ref 22–32)
CREATININE: 1.89 mg/dL — AB (ref 0.61–1.24)
Chloride: 100 mmol/L — ABNORMAL LOW (ref 101–111)
GFR, EST AFRICAN AMERICAN: 43 mL/min — AB (ref >60)
GFR, EST NON AFRICAN AMERICAN: 37 mL/min — AB (ref >60)
Glucose, Bld: 253 mg/dL — ABNORMAL HIGH (ref 65–99)
Potassium: 4.2 mmol/L (ref 3.5–5.1)
Sodium: 135 mmol/L (ref 135–145)
Total Bilirubin: 0.7 mg/dL (ref 0.3–1.2)
Total Protein: 7 g/dL (ref 6.5–8.1)

## 2016-10-04 LAB — LIPASE, BLOOD: LIPASE: 27 U/L (ref 11–51)

## 2016-10-04 SURGERY — CYSTOSCOPY, WITH RETROGRADE PYELOGRAM AND URETERAL STENT INSERTION
Anesthesia: General | Laterality: Bilateral

## 2016-10-04 MED ORDER — FENTANYL CITRATE (PF) 100 MCG/2ML IJ SOLN
50.0000 ug | INTRAMUSCULAR | Status: DC | PRN
  Administered 2016-10-04: 50 ug via INTRAVENOUS
  Filled 2016-10-04: qty 2

## 2016-10-04 MED ORDER — PROPOFOL 10 MG/ML IV BOLUS
INTRAVENOUS | Status: AC
  Filled 2016-10-04: qty 20

## 2016-10-04 MED ORDER — HYDRALAZINE HCL 20 MG/ML IJ SOLN: 10.0000 mg | Freq: Four times a day (QID) | INTRAMUSCULAR | Status: DC | PRN

## 2016-10-04 MED ORDER — INSULIN ASPART 100 UNIT/ML ~~LOC~~ SOLN
0.0000 [IU] | Freq: Three times a day (TID) | SUBCUTANEOUS | Status: DC
  Administered 2016-10-04 – 2016-10-05 (×2): 2 [IU] via SUBCUTANEOUS

## 2016-10-04 MED ORDER — PANTOPRAZOLE SODIUM 40 MG IV SOLR
40.0000 mg | INTRAVENOUS | Status: DC
Start: 2016-10-04 — End: 2016-10-05
  Administered 2016-10-04: 40 mg via INTRAVENOUS
  Filled 2016-10-04: qty 40

## 2016-10-04 MED ORDER — 0.9 % SODIUM CHLORIDE (POUR BTL) OPTIME
TOPICAL | Status: DC | PRN
  Administered 2016-10-04: 1000 mL

## 2016-10-04 MED ORDER — FENTANYL CITRATE (PF) 100 MCG/2ML IJ SOLN
INTRAMUSCULAR | Status: DC | PRN
  Administered 2016-10-04 (×2): 50 ug via INTRAVENOUS

## 2016-10-04 MED ORDER — ACETAMINOPHEN 325 MG PO TABS: 650.0000 mg | ORAL_TABLET | Freq: Four times a day (QID) | ORAL | Status: DC | PRN

## 2016-10-04 MED ORDER — HYDROMORPHONE HCL 1 MG/ML IJ SOLN
0.5000 mg | Freq: Once | INTRAMUSCULAR | Status: AC
  Administered 2016-10-04: 0.5 mg via INTRAVENOUS
  Filled 2016-10-04: qty 0.5

## 2016-10-04 MED ORDER — SUGAMMADEX SODIUM 500 MG/5ML IV SOLN
INTRAVENOUS | Status: AC
  Filled 2016-10-04: qty 5

## 2016-10-04 MED ORDER — MIDAZOLAM HCL 5 MG/5ML IJ SOLN
INTRAMUSCULAR | Status: DC | PRN
  Administered 2016-10-04: 2 mg via INTRAVENOUS

## 2016-10-04 MED ORDER — HYDROMORPHONE HCL 1 MG/ML IJ SOLN: 0.5000 mg | INTRAMUSCULAR | Status: DC | PRN

## 2016-10-04 MED ORDER — LACTATED RINGERS IV SOLN: INTRAVENOUS | Status: DC | PRN

## 2016-10-04 MED ORDER — SENNOSIDES-DOCUSATE SODIUM 8.6-50 MG PO TABS: 1.0000 | ORAL_TABLET | Freq: Every evening | ORAL | Status: DC | PRN

## 2016-10-04 MED ORDER — SODIUM CHLORIDE 0.9 % IV SOLN
INTRAVENOUS | Status: DC | PRN
  Administered 2016-10-04: 10:00:00 via INTRAVENOUS

## 2016-10-04 MED ORDER — ONDANSETRON HCL 4 MG/2ML IJ SOLN: 4.0000 mg | Freq: Four times a day (QID) | INTRAMUSCULAR | Status: DC | PRN

## 2016-10-04 MED ORDER — PROPOFOL 10 MG/ML IV BOLUS
INTRAVENOUS | Status: DC | PRN
  Administered 2016-10-04: 200 mg via INTRAVENOUS

## 2016-10-04 MED ORDER — SODIUM CHLORIDE 0.9 % IV SOLN
INTRAVENOUS | Status: DC
  Administered 2016-10-04 – 2016-10-05 (×2): via INTRAVENOUS

## 2016-10-04 MED ORDER — OXYCODONE HCL 5 MG PO TABS
5.0000 mg | ORAL_TABLET | ORAL | Status: DC | PRN
  Administered 2016-10-04 – 2016-10-05 (×3): 5 mg via ORAL
  Filled 2016-10-04 (×3): qty 1

## 2016-10-04 MED ORDER — LIDOCAINE 2% (20 MG/ML) 5 ML SYRINGE
INTRAMUSCULAR | Status: DC | PRN
  Administered 2016-10-04: 75 mg via INTRAVENOUS

## 2016-10-04 MED ORDER — ONDANSETRON HCL 4 MG PO TABS: 4.0000 mg | ORAL_TABLET | Freq: Four times a day (QID) | ORAL | Status: DC | PRN

## 2016-10-04 MED ORDER — KETOROLAC TROMETHAMINE 30 MG/ML IJ SOLN: 30.0000 mg | Freq: Once | INTRAMUSCULAR | Status: DC | PRN

## 2016-10-04 MED ORDER — HYDROMORPHONE HCL 1 MG/ML IJ SOLN: 0.5000 mg | Freq: Once | INTRAMUSCULAR | Status: DC

## 2016-10-04 MED ORDER — HYDROMORPHONE HCL 1 MG/ML IJ SOLN
1.0000 mg | Freq: Once | INTRAMUSCULAR | Status: AC
  Administered 2016-10-04: 1 mg via INTRAVENOUS
  Filled 2016-10-04: qty 1

## 2016-10-04 MED ORDER — DEXTROSE 5 % IV SOLN
1.0000 g | INTRAVENOUS | Status: DC
  Administered 2016-10-05: 1 g via INTRAVENOUS
  Filled 2016-10-04: qty 10

## 2016-10-04 MED ORDER — FENTANYL CITRATE (PF) 100 MCG/2ML IJ SOLN: 25.0000 ug | INTRAMUSCULAR | Status: DC | PRN

## 2016-10-04 MED ORDER — PROMETHAZINE HCL 25 MG/ML IJ SOLN: 6.2500 mg | INTRAMUSCULAR | Status: DC | PRN

## 2016-10-04 MED ORDER — ONDANSETRON HCL 4 MG/2ML IJ SOLN
INTRAMUSCULAR | Status: DC | PRN
  Administered 2016-10-04: 4 mg via INTRAVENOUS

## 2016-10-04 MED ORDER — SODIUM CHLORIDE 0.9 % IV BOLUS (SEPSIS)
1000.0000 mL | Freq: Once | INTRAVENOUS | Status: AC
  Administered 2016-10-04: 1000 mL via INTRAVENOUS

## 2016-10-04 MED ORDER — DEXTROSE 5 % IV SOLN
1.0000 g | Freq: Once | INTRAVENOUS | Status: AC
  Administered 2016-10-04: 1 g via INTRAVENOUS
  Filled 2016-10-04: qty 10

## 2016-10-04 MED ORDER — ACETAMINOPHEN 650 MG RE SUPP: 650.0000 mg | Freq: Four times a day (QID) | RECTAL | Status: DC | PRN

## 2016-10-04 MED ORDER — ALBUTEROL SULFATE (2.5 MG/3ML) 0.083% IN NEBU: 2.5000 mg | INHALATION_SOLUTION | RESPIRATORY_TRACT | Status: DC | PRN

## 2016-10-04 MED ORDER — ONDANSETRON HCL 4 MG/2ML IJ SOLN
4.0000 mg | Freq: Once | INTRAMUSCULAR | Status: AC
  Administered 2016-10-04: 4 mg via INTRAVENOUS
  Filled 2016-10-04: qty 2

## 2016-10-04 MED ORDER — SODIUM CHLORIDE 0.9 % IV SOLN: INTRAVENOUS | Status: DC

## 2016-10-04 MED ORDER — FENTANYL CITRATE (PF) 100 MCG/2ML IJ SOLN
INTRAMUSCULAR | Status: AC
  Filled 2016-10-04: qty 2

## 2016-10-04 MED ORDER — MIDAZOLAM HCL 2 MG/2ML IJ SOLN
INTRAMUSCULAR | Status: AC
  Filled 2016-10-04: qty 2

## 2016-10-04 MED ORDER — IOHEXOL 300 MG/ML  SOLN
INTRAMUSCULAR | Status: DC | PRN
  Administered 2016-10-04: 50 mL via INTRAVENOUS

## 2016-10-04 MED ORDER — ONDANSETRON 4 MG PO TBDP: 4.0000 mg | ORAL_TABLET | Freq: Once | ORAL | Status: DC | PRN

## 2016-10-04 MED ORDER — SODIUM CHLORIDE 0.9 % IR SOLN
Status: DC | PRN
  Administered 2016-10-04: 4000 mL

## 2016-10-04 SURGICAL SUPPLY — 12 items
BAG URO CATCHER STRL LF (MISCELLANEOUS) ×2 IMPLANT
CATH INTERMIT  6FR 70CM (CATHETERS) ×2 IMPLANT
CLOTH BEACON ORANGE TIMEOUT ST (SAFETY) ×2 IMPLANT
COVER SURGICAL LIGHT HANDLE (MISCELLANEOUS) ×2 IMPLANT
GLOVE BIOGEL M 8.0 STRL (GLOVE) ×2 IMPLANT
GOWN STRL REUS W/ TWL XL LVL3 (GOWN DISPOSABLE) ×1 IMPLANT
GOWN STRL REUS W/TWL XL LVL3 (GOWN DISPOSABLE) ×1
GUIDEWIRE STR DUAL SENSOR (WIRE) ×2 IMPLANT
MANIFOLD NEPTUNE II (INSTRUMENTS) ×2 IMPLANT
PACK CYSTO (CUSTOM PROCEDURE TRAY) ×2 IMPLANT
STENT URET 6FRX24 CONTOUR (STENTS) ×4 IMPLANT
TUBING CONNECTING 10 (TUBING) ×2 IMPLANT

## 2016-10-04 NOTE — ED Provider Notes (Signed)
[facility]-EMERGENCY DEPT Provider Note   CSN: [id number] Arrival date & time: 10/04/16  0421     History   Chief Complaint Chief Complaint  Patient presents with  . Flank Pain    HPI Austin Gould is a 61 y.o. male with past medical history DM, GERD, HLD, HTN, with L ureteral calculus with chief complaint  acute onset left lower quadrant and right flank pain which began acutely 2.5 hours ago. Patient states that pain is severe, constant and sharp. He initially thought this pain was due to indigestion and took Nexium 40 mg which was not helpful. Nothing worsens or improves his pain. He also endorses nausea which has improved and 2 episodes of vomiting while en route to the hospital. His history of kidney stones for which she was hospitalized in 2013  And was followed by a urologist whom he most recent I saw one year ago.   Denies fevers, chills, CP, SOB, diahrrea, dysuria, hematuria, melena.  The history is provided by the patient and the spouse.    Past Medical History:  Diagnosis Date  . Diabetes mellitus   . GERD (gastroesophageal reflux disease)   . Hyperlipidemia   . Hypertension   . Left ureteral calculus   . Microhematuria   . Umbilical hernia     Patient Active Problem List   Diagnosis Date Noted  . Ureteral stone with hydronephrosis 10/04/2016  . HTN (hypertension) 10/04/2016  . DM (diabetes mellitus), type 2 (HCC) 10/04/2016  . GERD (gastroesophageal reflux disease) 10/04/2016  . Dyslipidemia 10/04/2016    Past Surgical History:  Procedure Laterality Date  . CYSTOSCOPY WITH URETEROSCOPY  10/19/2011   Procedure: CYSTOSCOPY WITH URETEROSCOPY;  Surgeon: Milford Cage, MD;  Location: [facility];  Service: Urology;  Laterality: Left;  cystoscopy, left ureteroscopy, holmium laser, lithotripsey left ureter stent placement   . EXTRACORPOREAL SHOCK WAVE LITHOTRIPSY  08-18-2011   LEFT       Home Medications    Prior to Admission medications     Medication Sig Start Date End Date Taking? Authorizing Provider  aspirin EC 81 MG tablet Take 81 mg by mouth daily.   Yes [provider]  atorvastatin (LIPITOR) 40 MG tablet Take 40 mg by mouth every morning.    Yes [provider]  esomeprazole (NEXIUM) 40 MG capsule Take 40 mg by mouth daily as needed (reflux).   Yes [provider]  famotidine (PEPCID) 20 MG tablet Take 20 mg by mouth 2 (two) times daily as needed for heartburn. Heart burn    Yes [provider]  GARLIC PO Take 1 capsule by mouth 2 (two) times daily.   Yes [provider]  metFORMIN (GLUCOPHAGE) 1000 MG tablet Take 1,000 mg by mouth 2 (two) times daily with a meal.   Yes [provider]  nystatin cream (MYCOSTATIN) Apply 1 application topically daily as needed for dry skin.    Yes [provider]  olmesartan (BENICAR) 20 MG tablet Take 20 mg by mouth daily.   Yes [provider]  Omega-3 Fatty Acids (FISH OIL) 1000 MG CPDR Take 1,000 mg by mouth 2 (two) times daily.   Yes [provider]    Family History History reviewed. No pertinent family history.  Social History Social History  Substance Use Topics  . Smoking status: Former Smoker    Years: 25.00  . Smokeless tobacco: Never Used     Comment: occasional smoker as teen  . Alcohol use  Yes     Comment: rare     Allergies   Ciprofloxacin and Sulfamethoxazole-trimethoprim   Review of Systems Review of Systems  Constitutional: Negative for chills and fever.  Respiratory: Negative for shortness of breath.   Cardiovascular: Negative for chest pain.  Gastrointestinal: Positive for abdominal pain, nausea and vomiting. Negative for blood in stool and diarrhea.  Genitourinary: Positive for flank pain. Negative for dysuria and hematuria.  Neurological: Negative for syncope and headaches.  All other systems reviewed and are negative.    Physical Exam Updated Vital Signs BP (!)  144/80 (BP Location: Right Arm)   Pulse 82   Temp 98.6 F (37 C) (Oral)   Resp 16   Ht 5\' 5"  (1.651 m)   Wt 86.2 kg (190 lb)   SpO2 98%   BMI 31.62 kg/m   Physical Exam  Constitutional: He appears well-developed and well-nourished. No distress.  HENT:  Head: Normocephalic and atraumatic.  Eyes: Right eye exhibits no discharge. Left eye exhibits no discharge.  Neck: No JVD present. No tracheal deviation present.  Cardiovascular: Normal rate, regular rhythm, normal heart sounds and intact distal pulses.   2+ radial pulses bl  Pulmonary/Chest: Effort normal and breath sounds normal. He exhibits no tenderness.  Abdominal: Soft. Bowel sounds are normal. He exhibits no distension. There is tenderness. A hernia is present.  Right sided abdominal pain, Murphy's absent, Rovsing's absent. Right CVA tenderness. 3cm umbilical hernia, no tenderness or signs of strangulation  Musculoskeletal: He exhibits no edema.  Neurological: He is alert.  Skin: Skin is warm and dry. He is not diaphoretic.  Psychiatric: He has a normal mood and affect. His behavior is normal.     ED Treatments / Results  Labs (all labs ordered are listed, but only abnormal results are displayed) Labs Reviewed  URINALYSIS, ROUTINE W REFLEX MICROSCOPIC - Abnormal; Notable for the following:       Result Value   APPearance HAZY (*)    Glucose, UA 50 (*)    Hgb urine dipstick LARGE (*)    Leukocytes, UA SMALL (*)    Squamous Epithelial / LPF 0-5 (*)    All other components within normal limits  COMPREHENSIVE METABOLIC PANEL - Abnormal; Notable for the following:    Chloride 100 (*)    Glucose, Bld 253 (*)    BUN 21 (*)    Creatinine, Ser 1.89 (*)    Alkaline Phosphatase 35 (*)    GFR calc non Af Amer 37 (*)    GFR calc Af Amer 43 (*)    All other components within normal limits  CBC - Abnormal; Notable for the following:    WBC 13.3 (*)    Hemoglobin 12.8 (*)    All other components within normal limits  I-STAT  CHEM 8, ED - Abnormal; Notable for the following:    BUN 22 (*)    Creatinine, Ser 1.60 (*)    Glucose, Bld 249 (*)    Hemoglobin 12.9 (*)    HCT 38.0 (*)    All other components within normal limits  URINE CULTURE  LIPASE, BLOOD    EKG  EKG Interpretation None       Radiology Ct Renal Stone Study  Result Date: 10/04/2016 CLINICAL DATA:  61 year old diabetic hypertensive male with right lower quadrant pain radiating to right flank. History of periumbilical hernia and kidney stones (post lithotripsy). Initial encounter. EXAM: CT ABDOMEN AND PELVIS WITHOUT CONTRAST TECHNIQUE: Multidetector CT imaging of the abdomen  and pelvis was performed following the standard protocol without IV contrast. COMPARISON:  08/05/2011. FINDINGS: Lower chest: Basilar scarring/ atelectasis. Heart size within normal limits. Hepatobiliary: Top-normal size fatty liver. Taking into account limitation by non contrast imaging, there is liver lesion. No calcified gallstones. Pancreas: Taking into account limitation by non contrast imaging, no mass or inflammation. Spleen: Taking into account limitation by non contrast imaging, no worrisome mass or enlargement. Adrenals/Urinary Tract: Proximal right ureteral 4 x 3 x 4 mm obstructing stone with moderate right hydronephrosis. Parapelvic cysts slightly limited evaluation of the degree of hydronephrosis. Obstructing stone is located 16.9 cm proximal to the right ureteral vesicle junction. 9 mm and 8 mm central nonobstructing stones mid aspect right collecting system. 8 x 7 mm proximal left ureteral obstructing stone spanning over 1.4 cm causes hydronephrosis. Degree of hydronephrosis is difficult to adequately assess secondary to presence of parapelvic cysts and lack of contrast. Obstructing stone is located 14 cm proximal to the ureteral vesical junction. Left lower pole 6 and 7 mm nonobstructing calculi. Taking into account limitation by non contrast imaging, no obvious renal  mass. No adrenal lesion. Under distended noncontrast filled views of the urinary bladder with minimal impression upon the bladder base by slightly lobulated prostate gland. Stomach/Bowel: Evaluation of portions of bowel limited by lack of distension and presence of stool. No extraluminal bowel inflammatory process, free fluid or free air. Specifically, no inflammation surrounds the appendix. Vascular/Lymphatic: Scattered calcified plaque without abdominal aortic aneurysm. No adenopathy. Reproductive: Minimally lobulated prostate gland with minimal impression upon the bladder base. Other: Peri umbilical fat and vessel containing hernia with defect measuring up to 2.3 cm and protruding fat/ vessels measuring up to 5.8 cm. Previously this measured up to 3 cm. Musculoskeletal: Tiny sclerotic foci L3, L4 and S1 without change. Tiny sclerotic focus right acetabulum. Findings may represent bone islands assuming patient does not have prostate cancer. Sclerotic appearance of the ilium adjacent to sacroiliac joint unchanged and may be related to degenerative changes. Degenerative changes lumbar spine IMPRESSION: Proximal right ureteral 4 x 3 x 4 mm obstructing stone with moderate right hydronephrosis. Parapelvic cysts slightly limited evaluation of the degree of hydronephrosis. Obstructing stone is located 16.9 cm proximal to the right ureteral vesicle junction. 9 mm and 8 mm central nonobstructing stones mid aspect right collecting system. 8 x 7 mm proximal left ureteral obstructing stone spanning over 1.4 cm causes hydronephrosis. Degree of hydronephrosis is difficult to adequately assess secondary to presence of parapelvic cysts and lack of contrast. Obstructing stone is located 14 cm proximal to the ureteral vesical junction. Left lower pole 6 and 7 mm nonobstructing calculi. No extraluminal bowel inflammatory process, free fluid or free air. Specifically, no inflammation surrounds the appendix. Top-normal size fatty  liver. Minimally lobulated prostate gland with minimal impression upon the bladder base. Tiny sclerotic foci L3, L4 and S1 without change. Tiny sclerotic focus right acetabulum. Findings may represent bone islands assuming patient does not have prostate cancer. Sclerotic appearance of the ilium adjacent to sacroiliac joint unchanged and may be related to degenerative changes. Electronically Signed   By: Lacy Duverney M.D.   On: 10/04/2016 07:24    Procedures Procedures (including critical care time)  Medications Ordered in ED Medications  ondansetron (ZOFRAN-ODT) disintegrating tablet 4 mg (not administered)  fentaNYL (SUBLIMAZE) injection 50 mcg (50 mcg Intravenous Given 10/04/16 0510)  ondansetron (ZOFRAN) injection 4 mg (4 mg Intravenous Given 10/04/16 0510)  HYDROmorphone (DILAUDID) injection 1 mg (1 mg Intravenous  Given 10/04/16 0622)  sodium chloride 0.9 % bolus 1,000 mL (0 mLs Intravenous Stopped 10/04/16 0858)  sodium chloride 0.9 % bolus 1,000 mL (0 mLs Intravenous Stopped 10/04/16 0818)  cefTRIAXone (ROCEPHIN) 1 g in dextrose 5 % 50 mL IVPB (0 g Intravenous Stopped 10/04/16 0855)  HYDROmorphone (DILAUDID) injection 0.5 mg (0.5 mg Intravenous Given 10/04/16 0834)     Initial Impression / Assessment and Plan / ED Course  I have reviewed the triage vital signs and the nursing notes.  Pertinent labs & imaging results that were available during my care of the patient were reviewed by me and considered in my medical decision making (see chart for details).     Patient with hx DM and HTN with bilateral obstructing ureteral stones with hydronephrosis. Afebrile, vital signs stable. Acute renal insufficiency with Cr 1.89 with improvement to 1.60 after 2L NS. Pain managed while in ED. Consulted urology who will take him to the OR for b/l stent placement. Consulted hospitalist service, who will assume care and bring patient into the hospital for further management.   Final Clinical Impressions(s)  / ED Diagnoses   Final diagnoses:  Ureteral stone with hydronephrosis    New Prescriptions New Prescriptions   No medications on file     Bennye AlmFawze, Chae Oommen A, PA-C 10/04/16 [id number]    Palumbo, April, MD 10/06/16 2304

## 2016-10-04 NOTE — Progress Notes (Signed)
Night of surgery note  Subjective: The patient is doing well.  No complaints. In fact he tells me he feels much better he would like to go home tonight.  Objective: Vital signs in last 24 hours: Temp:  [97.9 F (36.6 C)-98.7 F (37.1 C)] 98.5 F (36.9 C) (05/22 1212) Pulse Rate:  [73-93] 87 (05/22 1212) Resp:  [13-18] 16 (05/22 1212) BP: (121-145)/(66-80) 140/66 (05/22 1212) SpO2:  [96 %-100 %] 98 % (05/22 1212) Weight:  [190 lb (86.2 kg)] 190 lb (86.2 kg) (05/22 1019)  Intake/Output this shift: Total I/O In: 400 [I.V.:400] Out: 0   Physical Exam:  General: Alert and oriented. Abdomen: Soft, Nondistended. Flank: No CVAT.  Lab Results:  Recent Labs  10/04/16 0451 10/04/16 0746  HGB 12.8* 12.9*  HCT 40.3 38.0*    Assessment: 1. Bilateral obstructing ureteral stones - He has double-J stents in place and is tolerating these. Definitive therapy of his ureteral stones was discussed briefly including lithotripsy versus ureteroscopy. He has had each of these procedures previously. He is going to follow-up with me as an outpatient to discuss further treatment.  2. Possible UTI - He had no fever or other signs of sepsis. His urine did not appear to be particularly concerning however as a diabetic adhesion was cultured however if he remains afebrile overnight my feeling is that he could be discharged on a broad-spectrum oral antibiotic. I will then follow-up with his culture and make any adjustments if his culture proves positive.  Plan:  1. Continue to monitor. 2. Would continue Rocephin overnight. 3. Could potentially discharged tomorrow if afebrile.   Garl Speigner C. Vernie Ammonsttelin, MD  Garnett FarmTTELIN,Deanne Bedgood C 10/04/2016, 4:16 PM

## 2016-10-04 NOTE — Op Note (Signed)
PATIENT:  Austin Gould  Preoperative diagnosis:  1. Bilateral ureteral obstruction secondary to bilateral ureteral calculi  Postoperative diagnosis:  1. Same  Procedure:  1. Cystoscopy 2. Bilateral retrograde pyelograms with interpretation. 3. Bilateral double-J stent placement 4. Fluoroscopy time less than 1 hour  Surgeon: Loraine Leriche C. Vernie Ammons, M.D.  Anesthesia: General  Complications: None  EBL: Minimal  Specimens: None  Drains: 6 French, 24 cm double-J stents in right and left ureters (no string)  Indication: Austin Gould is a 61 year old diabetic patient with bilateral ureteral calculi. He has been having severe right flank pain and has had hematuria. His urine did have some red cells and a few white cells but no bacteria. He is brought to the operating room today on an urgent basis for bilateral double-J stent placement. I have discussed the procedure with him in detail including its risks and, occasion to and the alternatives and he has elected to proceed. Informed consent has been obtained.  Description of procedure:  The patient was taken to the operating room and general anesthesia was induced.  The patient was placed in the dorsal lithotomy position, prepped and draped in the usual sterile fashion, and preoperative antibiotics were administered. A preoperative time-out was performed.   Cystourethroscopy was performed with a 20 [place] rigid cystoscope and 30 lens.  The patient's urethra was examined and was normal/ demonstrated bilobar prostatic hypertrophy/ bilobar prostatic hypertrophy that was mild. The bladder was then systematically examined in its entirety. There was no evidence for any bladder tumors, stones, or other mucosal pathology.    I passed a 6 [place] open-ended ureteral catheter through the cystoscope and into the right ureteral orifice. I then injected full-strength Omnipaque contrast up the right ureter under direct fluoroscopy. This revealed no  definite filling defects within the ureter although there did appear to be at least 2 filling defects in the renal pelvis that were felt likely to be clots since no lesions or stones were noted in this location on his CT scan.  A 0.038 sensor guidewire was then advanced up the right ureter into the renal pelvis under fluoroscopic guidance.  The wire was then backloaded through the cystoscope and a ureteral stent was advance over the wire using Seldinger technique.  The stent was positioned appropriately under fluoroscopic and cystoscopic guidance.  The wire was then removed with an adequate stent curl noted in the renal pelvis as well as in the bladder.  I then turned my attention to the left ureteral orifice and proceeded with a left retrograde pyelogram in an identical fashion. This revealed some slight dilation of the ureter but there were no filling defects. The contrast passed up to the stone that was seen on his preoperative CT scan and with some pressure I was able to get the contrast bypassed the stone and could be seen in the renal pelvis. I did not want to place undue pressure and therefore left the open-ended catheter in place and proceeded to pass the 0.038 inch sensor guidewire through the open-ended catheter and up to the stone. I was able to get the guidewire past the stone although there was some tortuosity of the ureter. I found it difficult to get the guidewire all the way up into the upper pole or even into the renal pelvic region although it appeared to be close to the renal pelvis. I therefore removed the open-ended catheter and passed the double-J stent over the guidewire and was able to negotiate this past the  stone but it appeared, as I began to remove the guidewire, that the curl on the stent was located well past the stone but appeared to possibly be at the ureteropelvic junction as opposed to all the way up into the renal pelvis. This was felt to be adequate for drainage of the kidney.  I therefore completely remove the guidewire with good curl noted in the bladder as well.  The bladder was then emptied and the procedure ended.  The patient appeared to tolerate the procedure well and without complications.  The patient was able to be awakened and transferred to the recovery unit in satisfactory condition.   While I believe the stents are in good position based on my intraoperative findings it would appear that his left ureteral stone, which was associated with a moderate amount of hydronephrosis, may have been present for some time and have caused some degree of silent obstruction as it seemed to be impacted.

## 2016-10-04 NOTE — Anesthesia Preprocedure Evaluation (Addendum)
Anesthesia Evaluation  Patient identified by MRN, date of birth, ID band Patient awake    Reviewed: Allergy & Precautions, H&P , NPO status , Patient's Chart, lab work & pertinent test results  Airway Mallampati: II  TM Distance: <3 FB Neck ROM: Full    Dental no notable dental hx.    Pulmonary neg pulmonary ROS, former smoker,    Pulmonary exam normal breath sounds clear to auscultation       Cardiovascular hypertension, Pt. on medications Normal cardiovascular exam Rhythm:Regular Rate:Normal     Neuro/Psych negative neurological ROS  negative psych ROS   GI/Hepatic negative GI ROS, Neg liver ROS,   Endo/Other  diabetes, Type 2, Oral Hypoglycemic Agents  Renal/GU negative Renal ROS  negative genitourinary   Musculoskeletal negative musculoskeletal ROS (+)   Abdominal   Peds negative pediatric ROS (+)  Hematology negative hematology ROS (+)   Anesthesia Other Findings   Reproductive/Obstetrics negative OB ROS                             Anesthesia Physical  Anesthesia Plan  ASA: III  Anesthesia Plan: General   Post-op Pain Management:    Induction: Intravenous  Airway Management Planned: LMA  Additional Equipment:   Intra-op Plan:   Post-operative Plan: Extubation in OR  Informed Consent: I have reviewed the patients History and Physical, chart, labs and discussed the procedure including the risks, benefits and alternatives for the proposed anesthesia with the patient or authorized representative who has indicated his/her understanding and acceptance.   Dental advisory given  Plan Discussed with: CRNA  Anesthesia Plan Comments:        Anesthesia Quick Evaluation

## 2016-10-04 NOTE — ED Triage Notes (Signed)
Pt c/o 9/10 RLQ pain that radiates to his left flank pain and n/v. Pt denies dysuria.

## 2016-10-04 NOTE — Transfer of Care (Signed)
Immediate Anesthesia Transfer of Care Note  Patient: Austin Gould  Procedure(s) Performed: Procedure(s): CYSTOSCOPY WITH RETROGRADE PYELOGRAM/BILATERAL URETERAL STENT PLACEMENT (Bilateral)  Patient Location: PACU  Anesthesia Type:General  Level of Consciousness: awake, sedated and responds to stimulation  Airway & Oxygen Therapy: Patient Spontanous Breathing and Patient connected to face mask oxygen  Post-op Assessment: Report given to RN and Post -op Vital signs reviewed and stable  Post vital signs: Reviewed and stable  Last Vitals:  Vitals:   10/04/16 0830 10/04/16 0949  BP: (!) 144/80 138/69  Pulse: 82 92  Resp:  18  Temp: 37 C 37.1 C    Last Pain:  Vitals:   10/04/16 1019  TempSrc:   PainSc: 8       Patients Stated Pain Goal: 4 (10/04/16 1019)  Complications: No apparent anesthesia complications

## 2016-10-04 NOTE — Consult Note (Signed)
Urology Consult  CC: Referring physician: Maretta Bees, MD Reason for referral:  Bilateral ureteral obstruction secondary to bilateral ureteral stones.  Assessment: Bilateral ureteral calculi - He has bilateral ureteral stones.  The right ureteral stone is proximal just below the UPJ and measures 3 mm and the left ureteral stone is in the proximal ureter and measures 8 mm in width with Hounsfield units of approximately 800.  There is hydronephrosis present on the left with minimal dilatation on the right.  In addition he has had some increase in his creatinine which was initially 1.89 and decreased to 1.6 with hydration.  He has a history of calculus disease in the past.  His urine did not appear to be definitely infected to me however he did receive antibiotics in the emergency room. I have discussed with the patient the fact that with bilateral ureteral stones and an elevation of his creatinine bilateral ureteral stent placement was indicated.  We discussed this procedure as well as alternatives however initially it is felt that he needs to have his kidneys unobstructed and in a diabetic with any possibility of possible urinary tract infection on obstruction of the kidneys is necessary but removal of the stones should not be undertaken until infection is been ruled out and if present treated. I went over the procedure with him in detail including the risks and complications, the need for secondary procedures to address his ureteral calculi including either ureteroscopy or lithotripsy and the probability of success as well as the anticipated postoperative course.  He understands and is elected to proceed.  2.  Bilateral renal calculi - He has nonobstructing bilateral renal calculi.  These are asymptomatic at this time.  3.  Possible UTI - His urine had no nitrite but was leukocyte esterase positive with 6-30 WBCs.  No bacteria were seen.  His urine has been cultured and he has received  Rocephin  Plan:  1.  He will be taken urgently to the OR for cystoscopy, bilateral retrograde pyelography and bilateral double-J stent placement. 2.  Urine culture - this has been done and is currently pending. 3.  Broad-spectrum antibiotics - he has received Rocephin. 4.  He will need definitive treatment of his bilateral ureteral calculi at a later date.    Subjective: Austin Gould is a 61 y.o. male with medical history significant of nephrolithiasis, type 2 diabetes, hypertension and dyslipidemia who presented to the ED for evaluation of the above-noted complaints. He has seen Dr. Mena Goes in the past for calculus disease.  Per patient, he woke up this morning with bilateral flank pain. He rates the pain as 10/10, dull in nature, starting in his bilateral lower abdominal area sending to the bilateral flank area. He said the pain in his right flank radiating to the right groin region. There is no particular aggravating factor, pain is somewhat relieved by IV narcotics. He denies any fever. He does have some hematuria, but denies any dysuria or frequency of urination. He claims that the right flank area hurts more than the left. He has undergone ureteroscopy with laser lithotripsy and ESWL in the past for stones and has also passed stone spontaneously. His stones have been analyzed hand has been noted to be calcium oxalate. He has not undergone a formal stone risk evaluation.  In the emergency room-patient was found to have bilateral hydronephrosis due to ureteral stones.  Past Medical History:  Diagnosis Date  . Diabetes mellitus   . GERD (gastroesophageal reflux disease)   .  Hyperlipidemia   . Hypertension   . Left ureteral calculus   . Microhematuria   . Umbilical hernia    Past Surgical History:  Procedure Laterality Date  . CYSTOSCOPY WITH URETEROSCOPY  10/19/2011   Procedure: CYSTOSCOPY WITH URETEROSCOPY;  Surgeon: Milford Cage, MD;  Location: [facility];   Service: Urology;  Laterality: Left;  cystoscopy, left ureteroscopy, holmium laser, lithotripsey left ureter stent placement   . EXTRACORPOREAL SHOCK WAVE LITHOTRIPSY  08-18-2011   LEFT    Medications:  Prior to Admission:  Prescriptions Prior to Admission  Medication Sig Dispense Refill Last Dose  . aspirin EC 81 MG tablet Take 81 mg by mouth daily.   10/03/2016 at 2130  . atorvastatin (LIPITOR) 40 MG tablet Take 40 mg by mouth every morning.    10/03/2016 at Unknown time  . esomeprazole (NEXIUM) 40 MG capsule Take 40 mg by mouth daily as needed (reflux).   10/03/2016 at Unknown time  . famotidine (PEPCID) 20 MG tablet Take 20 mg by mouth 2 (two) times daily as needed for heartburn. Heart burn    unk  . GARLIC PO Take 1 capsule by mouth 2 (two) times daily.   10/03/2016 at Unknown time  . metFORMIN (GLUCOPHAGE) 1000 MG tablet Take 1,000 mg by mouth 2 (two) times daily with a meal.   10/03/2016 at Unknown time  . nystatin cream (MYCOSTATIN) Apply 1 application topically daily as needed for dry skin.    unk  . olmesartan (BENICAR) 20 MG tablet Take 20 mg by mouth daily.   10/03/2016 at Unknown time  . Omega-3 Fatty Acids (FISH OIL) 1000 MG CPDR Take 1,000 mg by mouth 2 (two) times daily.   10/03/2016 at Unknown time    Allergies:  Allergies  Allergen Reactions  . Ciprofloxacin Hives  . Sulfamethoxazole-Trimethoprim Hives    History reviewed. No pertinent family history.  Social History:  reports that he has quit smoking. He quit after 25.00 years of use. He has never used smokeless tobacco. He reports that he drinks alcohol. He reports that he does not use drugs.  Review of Systems (10 point): Pertinent items are noted in HPI. A comprehensive review of systems was negative except as noted above.  Physical Exam:  Vital signs in last 24 hours: Temp:  [97.9 F (36.6 C)-98.7 F (37.1 C)] 98.7 F (37.1 C) (05/22 0949) Pulse Rate:  [73-92] 92 (05/22 0949) Resp:  [16-18] 18 (05/22  0949) BP: (138-145)/(69-80) 138/69 (05/22 0949) SpO2:  [97 %-98 %] 98 % (05/22 0949) Weight:  [190 lb (86.2 kg)] 190 lb (86.2 kg) (05/22 0428) General appearance: alert and appears stated age Head: Normocephalic, without obvious abnormality, atraumatic Eyes: conjunctivae/corneas clear. EOM's intact.  Oropharynx: moist mucous membranes Neck: supple, symmetrical, trachea midline Resp: normal respiratory effort Cardio: regular rate and rhythm Back: symmetric, no curvature. ROM normal. No CVA tenderness. GI: soft, non-tender; bowel sounds normal; no masses,  no organomegaly Male genitalia: penis: normal male phallus with no lesions or discharge.Testes: bilaterally descended with no masses or tenderness. no hernias Extremities: extremities normal, atraumatic, no cyanosis or edema Skin: Skin color normal. No visible rashes or lesions Neurologic: Grossly normal  Laboratory Data:   Recent Labs  10/04/16 0451 10/04/16 0746  WBC 13.3*  --   HGB 12.8* 12.9*  HCT 40.3 38.0*   BMET  Recent Labs  10/04/16 0451 10/04/16 0746  NA 135 137  K 4.2 4.6  CL 100* 101  CO2 24  --  GLUCOSE 253* 249*  BUN 21* 22*  CREATININE 1.89* 1.60*  CALCIUM 9.5  --    No results for input(s): LABPT, INR in the last 72 hours. No results for input(s): LABURIN in the last 72 hours. No results found for this or any previous visit. Creatinine:  Recent Labs  10/04/16 0451 10/04/16 0746  CREATININE 1.89* 1.60*    Imaging: Ct Renal Stone Study  Result Date: 10/04/2016 CLINICAL DATA:  61 year old diabetic hypertensive male with right lower quadrant pain radiating to right flank. History of periumbilical hernia and kidney stones (post lithotripsy). Initial encounter. EXAM: CT ABDOMEN AND PELVIS WITHOUT CONTRAST TECHNIQUE: Multidetector CT imaging of the abdomen and pelvis was performed following the standard protocol without IV contrast. COMPARISON:  08/05/2011. FINDINGS: Lower chest: Basilar scarring/  atelectasis. Heart size within normal limits. Hepatobiliary: Top-normal size fatty liver. Taking into account limitation by non contrast imaging, there is liver lesion. No calcified gallstones. Pancreas: Taking into account limitation by non contrast imaging, no mass or inflammation. Spleen: Taking into account limitation by non contrast imaging, no worrisome mass or enlargement. Adrenals/Urinary Tract: Proximal right ureteral 4 x 3 x 4 mm obstructing stone with moderate right hydronephrosis. Parapelvic cysts slightly limited evaluation of the degree of hydronephrosis. Obstructing stone is located 16.9 cm proximal to the right ureteral vesicle junction. 9 mm and 8 mm central nonobstructing stones mid aspect right collecting system. 8 x 7 mm proximal left ureteral obstructing stone spanning over 1.4 cm causes hydronephrosis. Degree of hydronephrosis is difficult to adequately assess secondary to presence of parapelvic cysts and lack of contrast. Obstructing stone is located 14 cm proximal to the ureteral vesical junction. Left lower pole 6 and 7 mm nonobstructing calculi. Taking into account limitation by non contrast imaging, no obvious renal mass. No adrenal lesion. Under distended noncontrast filled views of the urinary bladder with minimal impression upon the bladder base by slightly lobulated prostate gland. Stomach/Bowel: Evaluation of portions of bowel limited by lack of distension and presence of stool. No extraluminal bowel inflammatory process, free fluid or free air. Specifically, no inflammation surrounds the appendix. Vascular/Lymphatic: Scattered calcified plaque without abdominal aortic aneurysm. No adenopathy. Reproductive: Minimally lobulated prostate gland with minimal impression upon the bladder base. Other: Peri umbilical fat and vessel containing hernia with defect measuring up to 2.3 cm and protruding fat/ vessels measuring up to 5.8 cm. Previously this measured up to 3 cm. Musculoskeletal: Tiny  sclerotic foci L3, L4 and S1 without change. Tiny sclerotic focus right acetabulum. Findings may represent bone islands assuming patient does not have prostate cancer. Sclerotic appearance of the ilium adjacent to sacroiliac joint unchanged and may be related to degenerative changes. Degenerative changes lumbar spine IMPRESSION: Proximal right ureteral 4 x 3 x 4 mm obstructing stone with moderate right hydronephrosis. Parapelvic cysts slightly limited evaluation of the degree of hydronephrosis. Obstructing stone is located 16.9 cm proximal to the right ureteral vesicle junction. 9 mm and 8 mm central nonobstructing stones mid aspect right collecting system. 8 x 7 mm proximal left ureteral obstructing stone spanning over 1.4 cm causes hydronephrosis. Degree of hydronephrosis is difficult to adequately assess secondary to presence of parapelvic cysts and lack of contrast. Obstructing stone is located 14 cm proximal to the ureteral vesical junction. Left lower pole 6 and 7 mm nonobstructing calculi. No extraluminal bowel inflammatory process, free fluid or free air. Specifically, no inflammation surrounds the appendix. Top-normal size fatty liver. Minimally lobulated prostate gland with minimal impression upon the bladder  base. Tiny sclerotic foci L3, L4 and S1 without change. Tiny sclerotic focus right acetabulum. Findings may represent bone islands assuming patient does not have prostate cancer. Sclerotic appearance of the ilium adjacent to sacroiliac joint unchanged and may be related to degenerative changes. Electronically Signed   By: Lacy Duverney M.D.   On: 10/04/2016 07:24       Austin Gould 10/04/2016, 9:58 AM

## 2016-10-04 NOTE — Care Management Note (Signed)
Case Management Note  Patient Details  Name: Austin Gould L Jaber MRN: [id number] Date of Birth: 11/10/1955  Subjective/Objective: 61 y/o m admitted w/Bilateral ureteral calculi, s/p cystoscopy,& double J stent. From home.                  Action/Plan:d/c plan home.   Expected Discharge Date:   (unknown)               Expected Discharge Plan:  Home/Self Care  In-House Referral:     Discharge planning Services  CM Consult  Post Acute Care Choice:    Choice offered to:     DME Arranged:    DME Agency:     HH Arranged:    HH Agency:     Status of Service:  In process, will continue to follow  If discussed at Long Length of Stay Meetings, dates discussed:    Additional Comments:  Lanier ClamMahabir, Marquasia Schmieder, RN 10/04/2016, 12:52 PM

## 2016-10-04 NOTE — Anesthesia Procedure Notes (Signed)
Procedure Name: LMA Insertion Date/Time: 10/04/2016 10:43 AM Performed by: Elyn PeersALLEN, Raizy Auzenne J Pre-anesthesia Checklist: Patient identified, Emergency Drugs available, Suction available, Patient being monitored and Timeout performed Patient Re-evaluated:Patient Re-evaluated prior to inductionOxygen Delivery Method: Circle system utilized Preoxygenation: Pre-oxygenation with 100% oxygen Intubation Type: IV induction Ventilation: Mask ventilation without difficulty LMA: LMA inserted LMA Size: 4.0 Number of attempts: 1 Placement Confirmation: positive ETCO2 and breath sounds checked- equal and bilateral Tube secured with: Tape Dental Injury: Teeth and Oropharynx as per pre-operative assessment

## 2016-10-04 NOTE — H&P (Signed)
HISTORY AND PHYSICAL       PATIENT DETAILS Name: Austin Gould Age: 61 y.o. Sex: male Date of Birth: Jul 15, 1955 Admit Date: 10/04/2016 ZOX:WRUEAV, Gloriajean Dell, MD   Patient coming from: Home   CHIEF COMPLAINT:  Bilateral flank pain-right>> left-since this morning  HPI: Austin Gould is a 61 y.o. male with medical history significant of nephrolithiasis, type 2 diabetes, hypertension and dyslipidemia who presented to the ED for evaluation of the above-noted complaints. Per patient, he woke up this morning with bilateral flank pain. He rates the pain as 10/10, dull in nature, starting in his bilateral lower abdominal area sending to the bilateral flank area. There is no radiation of the pain. There is no particular aggravating factor, pain is somewhat relieved by IV narcotics. He denies any fever. He does have some hematuria, but denies any dysuria or frequency of urination. He claims that the right flank area hurts more than the left.  There is no history of chest pain, shortness of breath, fever, nausea, vomiting or diarrhea.  ED Course:  In the emergency room-patient was found to have bilateral hydronephrosis due to ureteral stones-urology was consulted, because of patient's uncontrolled hypertension and elevated blood sugars-the hospitalist service was asked to admit this patient for further evaluation and treatment.  Note: Lives at: Home Mobility:  Independent Chronic Indwelling Foley:no   REVIEW OF SYSTEMS:  Constitutional:   No  weight loss, night sweats,  Fevers, chills, fatigue.  HEENT:    No headaches, Dysphagia,Tooth/dental problems,Sore throat  Cardio-vascular: No chest pain,Orthopnea, PND,lower extremity edema, anasarca, palpitations  GI:  No heartburn, indigestion,  vomiting, diarrhea, melena or hematochezia  Resp: No shortness of breath, cough, hemoptysis,plueritic chest pain.   Skin:  No rash or lesions.  GU:  No  dysuria  Musculoskeletal: No joint pain or swelling.  No decreased range of motion.  No back pain.  Endocrine: No heat intolerance, no cold intolerance, no polyuria, no polydipsia  Psych: No change in mood or affect. No depression or anxiety.  No memory loss.   ALLERGIES:   Allergies  Allergen Reactions  . Ciprofloxacin Hives  . Sulfamethoxazole-Trimethoprim Hives    PAST MEDICAL HISTORY: Past Medical History:  Diagnosis Date  . Diabetes mellitus   . GERD (gastroesophageal reflux disease)   . Hyperlipidemia   . Hypertension   . Left ureteral calculus   . Microhematuria   . Umbilical hernia     PAST SURGICAL HISTORY: Past Surgical History:  Procedure Laterality Date  . CYSTOSCOPY WITH URETEROSCOPY  10/19/2011   Procedure: CYSTOSCOPY WITH URETEROSCOPY;  Surgeon: Milford Cage, MD;  Location: [facility];  Service: Urology;  Laterality: Left;  cystoscopy, left ureteroscopy, holmium laser, lithotripsey left ureter stent placement   . EXTRACORPOREAL SHOCK WAVE LITHOTRIPSY  08-18-2011   LEFT    MEDICATIONS AT HOME: Prior to Admission medications   Medication Sig Start Date End Date Taking? Authorizing Provider  aspirin EC 81 MG tablet Take 81 mg by mouth daily.   Yes [provider]  atorvastatin (LIPITOR) 40 MG tablet Take 40 mg by mouth every morning.    Yes [provider]  esomeprazole (NEXIUM) 40 MG capsule Take 40 mg by mouth daily as needed (reflux).   Yes [provider]  famotidine (PEPCID) 20 MG tablet Take 20 mg by mouth 2 (two) times daily as needed for heartburn. Heart burn    Yes [provider]  GARLIC PO  Take 1 capsule by mouth 2 (two) times daily.   Yes [provider]  metFORMIN (GLUCOPHAGE) 1000 MG tablet Take 1,000 mg by mouth 2 (two) times daily with a meal.   Yes [provider]  nystatin cream (MYCOSTATIN) Apply 1 application topically daily as needed for dry skin.    Yes  [provider]  olmesartan (BENICAR) 20 MG tablet Take 20 mg by mouth daily.   Yes [provider]  Omega-3 Fatty Acids (FISH OIL) 1000 MG CPDR Take 1,000 mg by mouth 2 (two) times daily.   Yes [provider]    FAMILY HISTORY: Mother-ovarian cancer  SOCIAL HISTORY:  reports that he has quit smoking. He quit after 25.00 years of use. He has never used smokeless tobacco. He reports that he drinks alcohol. He reports that he does not use drugs.  PHYSICAL EXAM: Blood pressure (!) 144/80, pulse 82, temperature 98.6 F (37 C), temperature source Oral, resp. rate 16, height 5\' 5"  (1.651 m), weight 86.2 kg (190 lb), SpO2 98 %.  General appearance :Awake, alert, not in any distress. Speech Clear. Not toxic Looking Eyes:, pupils equally reactive to light and accomodation,no scleral icterus.Pink conjunctiva HEENT: Atraumatic and Normocephalic Neck: supple, no JVD. No cervical lymphadenopathy. No thyromegaly Resp:Good air entry bilaterally, no added sounds  CVS: S1 S2 regular, no murmurs.  GI: Bowel sounds present, Non tender and not distended with no gaurding, rigidity or rebound.No organomegaly. Right CVA angle is markedly tender Extremities: B/L Lower Ext shows no edema, both legs are warm to touch Neurology:  speech clear,Non focal, sensation is grossly intact. Psychiatric: Normal judgment and insight. Alert and oriented x 3. Normal mood. Musculoskeletal:No digital cyanosis Skin:No Rash, warm and dry Wounds:N/A  LABS ON ADMISSION:  I have personally reviewed following labs and imaging studies  CBC:  Recent Labs Lab 10/04/16 0451 10/04/16 0746  WBC 13.3*  --   HGB 12.8* 12.9*  HCT 40.3 38.0*  MCV 83.6  --   PLT 287  --     Basic Metabolic Panel:  Recent Labs Lab 10/04/16 0451 10/04/16 0746  NA 135 137  K 4.2 4.6  CL 100* 101  CO2 24  --   GLUCOSE 253* 249*  BUN 21* 22*  CREATININE 1.89* 1.60*  CALCIUM 9.5  --     GFR: Estimated  Creatinine Clearance: 50.2 mL/min (A) (by C-G formula based on SCr of 1.6 mg/dL (H)).  Liver Function Tests:  Recent Labs Lab 10/04/16 0451  AST 19  ALT 18  ALKPHOS 35*  BILITOT 0.7  PROT 7.0  ALBUMIN 3.9    Recent Labs Lab 10/04/16 0451  LIPASE 27   No results for input(s): AMMONIA in the last 168 hours.  Coagulation Profile: No results for input(s): INR, PROTIME in the last 168 hours.  Cardiac Enzymes: No results for input(s): CKTOTAL, CKMB, CKMBINDEX, TROPONINI in the last 168 hours.  BNP (last 3 results) No results for input(s): PROBNP in the last 8760 hours.  HbA1C: No results for input(s): HGBA1C in the last 72 hours.  CBG: No results for input(s): GLUCAP in the last 168 hours.  Lipid Profile: No results for input(s): CHOL, HDL, LDLCALC, TRIG, CHOLHDL, LDLDIRECT in the last 72 hours.  Thyroid Function Tests: No results for input(s): TSH, T4TOTAL, FREET4, T3FREE, THYROIDAB in the last 72 hours.  Anemia Panel: No results for input(s): VITAMINB12, FOLATE, FERRITIN, TIBC, IRON, RETICCTPCT in the last 72 hours.  Urine analysis:    Component  Value Date/Time   COLORURINE YELLOW 10/04/2016 0550   APPEARANCEUR HAZY (A) 10/04/2016 0550   LABSPEC 1.011 10/04/2016 0550   PHURINE 5.0 10/04/2016 0550   GLUCOSEU 50 (A) 10/04/2016 0550   HGBUR LARGE (A) 10/04/2016 0550   BILIRUBINUR NEGATIVE 10/04/2016 0550   KETONESUR NEGATIVE 10/04/2016 0550   PROTEINUR NEGATIVE 10/04/2016 0550   NITRITE NEGATIVE 10/04/2016 0550   LEUKOCYTESUR SMALL (A) 10/04/2016 0550    Sepsis Labs: Lactic Acid, Venous No results found for: LATICACIDVEN   Microbiology: No results found for this or any previous visit (from the past 240 hour(s)).    RADIOLOGIC STUDIES ON ADMISSION: Ct Renal Stone Study  Result Date: 10/04/2016 CLINICAL DATA:  61 year old diabetic hypertensive male with right lower quadrant pain radiating to right flank. History of periumbilical hernia and kidney  stones (post lithotripsy). Initial encounter. EXAM: CT ABDOMEN AND PELVIS WITHOUT CONTRAST TECHNIQUE: Multidetector CT imaging of the abdomen and pelvis was performed following the standard protocol without IV contrast. COMPARISON:  08/05/2011. FINDINGS: Lower chest: Basilar scarring/ atelectasis. Heart size within normal limits. Hepatobiliary: Top-normal size fatty liver. Taking into account limitation by non contrast imaging, there is liver lesion. No calcified gallstones. Pancreas: Taking into account limitation by non contrast imaging, no mass or inflammation. Spleen: Taking into account limitation by non contrast imaging, no worrisome mass or enlargement. Adrenals/Urinary Tract: Proximal right ureteral 4 x 3 x 4 mm obstructing stone with moderate right hydronephrosis. Parapelvic cysts slightly limited evaluation of the degree of hydronephrosis. Obstructing stone is located 16.9 cm proximal to the right ureteral vesicle junction. 9 mm and 8 mm central nonobstructing stones mid aspect right collecting system. 8 x 7 mm proximal left ureteral obstructing stone spanning over 1.4 cm causes hydronephrosis. Degree of hydronephrosis is difficult to adequately assess secondary to presence of parapelvic cysts and lack of contrast. Obstructing stone is located 14 cm proximal to the ureteral vesical junction. Left lower pole 6 and 7 mm nonobstructing calculi. Taking into account limitation by non contrast imaging, no obvious renal mass. No adrenal lesion. Under distended noncontrast filled views of the urinary bladder with minimal impression upon the bladder base by slightly lobulated prostate gland. Stomach/Bowel: Evaluation of portions of bowel limited by lack of distension and presence of stool. No extraluminal bowel inflammatory process, free fluid or free air. Specifically, no inflammation surrounds the appendix. Vascular/Lymphatic: Scattered calcified plaque without abdominal aortic aneurysm. No adenopathy.  Reproductive: Minimally lobulated prostate gland with minimal impression upon the bladder base. Other: Peri umbilical fat and vessel containing hernia with defect measuring up to 2.3 cm and protruding fat/ vessels measuring up to 5.8 cm. Previously this measured up to 3 cm. Musculoskeletal: Tiny sclerotic foci L3, L4 and S1 without change. Tiny sclerotic focus right acetabulum. Findings may represent bone islands assuming patient does not have prostate cancer. Sclerotic appearance of the ilium adjacent to sacroiliac joint unchanged and may be related to degenerative changes. Degenerative changes lumbar spine IMPRESSION: Proximal right ureteral 4 x 3 x 4 mm obstructing stone with moderate right hydronephrosis. Parapelvic cysts slightly limited evaluation of the degree of hydronephrosis. Obstructing stone is located 16.9 cm proximal to the right ureteral vesicle junction. 9 mm and 8 mm central nonobstructing stones mid aspect right collecting system. 8 x 7 mm proximal left ureteral obstructing stone spanning over 1.4 cm causes hydronephrosis. Degree of hydronephrosis is difficult to adequately assess secondary to presence of parapelvic cysts and lack of contrast. Obstructing stone is located 14 cm proximal to  the ureteral vesical junction. Left lower pole 6 and 7 mm nonobstructing calculi. No extraluminal bowel inflammatory process, free fluid or free air. Specifically, no inflammation surrounds the appendix. Top-normal size fatty liver. Minimally lobulated prostate gland with minimal impression upon the bladder base. Tiny sclerotic foci L3, L4 and S1 without change. Tiny sclerotic focus right acetabulum. Findings may represent bone islands assuming patient does not have prostate cancer. Sclerotic appearance of the ilium adjacent to sacroiliac joint unchanged and may be related to degenerative changes. Electronically Signed   By: Lacy DuverneySteven  Olson M.D.   On: 10/04/2016 07:24    I have personally reviewed images of CT  of the abdomen   ASSESSMENT AND PLAN: Bilateral hydronephrosis due to ureteral stone: Keep nothing by mouth-supportive care with IV fluids and as needed narcotics. Will empiric recovery with intravenous Rocephin for now. Await urology evaluation-per ED M.D.-plan is to take the patient to the operating room later this morning.  Acute kidney injury: Probably obstructive uropathy, should improve once obstruction has been removed-repeat electrolytes tomorrow. Hydrate for now.  Hypertension: Hold antihypertensives as patient is nothing by mouth, utilize IV hydralazine as needed-once diet is resumed, suspect we could resume usual antihypertensives  DM-2: Hold oral hypoglycemic agents-once diet is resumed postoperatively, suspect we could resume oral hypoglycemic agents-cover with SSI for now.  Dyslipidemia: Hold her Lipitor as nothing by mouth-resume postoperatively when able.  GERD: Change Protonix to IV-follow  Further plan will depend as patient's clinical course evolves and further radiologic and laboratory data become available. Patient will be monitored closely.  Above noted plan was discussed with patient/family face to face at bedside, they were in agreement.   CONSULTS: None  DVT Prophylaxis: SCD's  Code Status: Full Code  Disposition Plan:  Discharge back home  possibly in 2-3 days, depending on clinical course  Admission status: Inpatient going to medical floor  Total time spent  55 minutes.Greater than 50% of this time was spent in counseling, explanation of diagnosis, planning of further management, and coordination of care.  Jeoffrey MassedShanker Ghimire [facility] Pager [phone]  If 7PM-7AM, please contact night-coverage [email or web address] Password [email or web address] 10/04/2016, 8:44 AM

## 2016-10-04 NOTE — ED Notes (Signed)
Pt gave 3 rings and necklace to wife. Clothes and wallet given to wife. Wife at bedside.

## 2016-10-05 ENCOUNTER — Encounter (HOSPITAL_COMMUNITY): Payer: Self-pay | Admitting: Urology

## 2016-10-05 DIAGNOSIS — N132 Hydronephrosis with renal and ureteral calculous obstruction: Secondary | ICD-10-CM

## 2016-10-05 DIAGNOSIS — I10 Essential (primary) hypertension: Secondary | ICD-10-CM | POA: Diagnosis not present

## 2016-10-05 DIAGNOSIS — K219 Gastro-esophageal reflux disease without esophagitis: Secondary | ICD-10-CM | POA: Diagnosis not present

## 2016-10-05 DIAGNOSIS — E785 Hyperlipidemia, unspecified: Secondary | ICD-10-CM | POA: Diagnosis not present

## 2016-10-05 DIAGNOSIS — E119 Type 2 diabetes mellitus without complications: Secondary | ICD-10-CM

## 2016-10-05 LAB — BASIC METABOLIC PANEL
ANION GAP: 8 (ref 5–15)
BUN: 15 mg/dL (ref 6–20)
CALCIUM: 8.6 mg/dL — AB (ref 8.9–10.3)
CHLORIDE: 106 mmol/L (ref 101–111)
CO2: 23 mmol/L (ref 22–32)
Creatinine, Ser: 1.34 mg/dL — ABNORMAL HIGH (ref 0.61–1.24)
GFR calc non Af Amer: 56 mL/min — ABNORMAL LOW (ref >60)
Glucose, Bld: 178 mg/dL — ABNORMAL HIGH (ref 65–99)
Potassium: 3.9 mmol/L (ref 3.5–5.1)
SODIUM: 137 mmol/L (ref 135–145)

## 2016-10-05 LAB — CBC
HCT: 36.7 % — ABNORMAL LOW (ref 39.0–52.0)
HEMOGLOBIN: 11.8 g/dL — AB (ref 13.0–17.0)
MCH: 26.8 pg (ref 26.0–34.0)
MCHC: 32.2 g/dL (ref 30.0–36.0)
MCV: 83.4 fL (ref 78.0–100.0)
Platelets: 254 10*3/uL (ref 150–400)
RBC: 4.4 MIL/uL (ref 4.22–5.81)
RDW: 13.9 % (ref 11.5–15.5)
WBC: 10.1 10*3/uL (ref 4.0–10.5)

## 2016-10-05 LAB — GLUCOSE, CAPILLARY
GLUCOSE-CAPILLARY: 186 mg/dL — AB (ref 65–99)
Glucose-Capillary: 254 mg/dL — ABNORMAL HIGH (ref 65–99)
Glucose-Capillary: 210 mg/dL — ABNORMAL HIGH (ref 65–99)

## 2016-10-05 LAB — URINE CULTURE: Culture: NO GROWTH

## 2016-10-05 LAB — HIV ANTIBODY (ROUTINE TESTING W REFLEX): HIV Screen 4th Generation wRfx: NONREACTIVE

## 2016-10-05 MED ORDER — SENNOSIDES-DOCUSATE SODIUM 8.6-50 MG PO TABS: 1.0000 | ORAL_TABLET | Freq: Every evening | ORAL | 0 refills | Status: DC | PRN

## 2016-10-05 MED ORDER — CEFUROXIME AXETIL 250 MG PO TABS: 250.0000 mg | ORAL_TABLET | Freq: Two times a day (BID) | ORAL | 0 refills | Status: AC

## 2016-10-05 MED ORDER — OXYCODONE HCL 5 MG PO TABS: 5.0000 mg | ORAL_TABLET | ORAL | 0 refills | Status: DC | PRN

## 2016-10-05 NOTE — Anesthesia Postprocedure Evaluation (Signed)
Anesthesia Post Note  Patient: Austin Gould L Register  Procedure(s) Performed: Procedure(s) (LRB): CYSTOSCOPY WITH RETROGRADE PYELOGRAM/BILATERAL URETERAL STENT PLACEMENT (Bilateral)  Patient location during evaluation: PACU Anesthesia Type: General Level of consciousness: sedated and patient cooperative Pain management: pain level controlled Vital Signs Assessment: post-procedure vital signs reviewed and stable Respiratory status: spontaneous breathing Cardiovascular status: stable Anesthetic complications: no       Last Vitals:  Vitals:   10/04/16 2045 10/05/16 0600  BP: 130/70 133/76  Pulse: 84 76  Resp: 18 18  Temp: 37.1 C 37.1 C    Last Pain:  Vitals:   10/05/16 0600  TempSrc: Oral  PainSc:                  Lewie LoronJohn Elanore Talcott

## 2016-10-05 NOTE — Progress Notes (Signed)
Pt and family given discharge teaching and medication teaching. Understanding verbalized by Pt and family. Discharge packet with prescriptions and discharge instructions with pt at time of discharge. VSS No acute changes noted with Pt's assessment at time of discharge

## 2016-10-05 NOTE — Progress Notes (Signed)
Assessment: Bilateral ureteral calculi: He has bilateral double-J stents in. His white blood cell count is normal, he has not experienced any fever throughout his hospitalization and his creatinine is currently pending. He is tolerating his stents fairly well but is having some bladder irritation. I have not recommended the use of an anticholinergic for the symptoms yet but if they persist I told him he could contact me and I would send in a prescription for this medication. Otherwise he would appear to be ready for discharge today. The use of a broad-spectrum antibiotic such as Bactrim upon discharge will be recommended until his culture results return. I will follow-up on his culture results myself and contact him regarding these.  Plan: 1. Ready for discharge from a urologic standpoint. 2. I will follow up on culture results as an outpatient. 3. I have placed follow-up information on the chart and will see him as an outpatient to schedule definitive treatment of his bilateral ureteral calculi.   Subjective: Patient reports experiencing a sensation of needing to urinate more frequently. Otherwise he is without complaint. He is not having any pain.  Objective: Vital signs in last 24 hours: Temp:  [98.2 F (36.8 C)-98.8 F (37.1 C)] 98.8 F (37.1 C) (05/22 2045) Pulse Rate:  [82-93] 84 (05/22 2045) Resp:  [13-18] 18 (05/22 2045) BP: (121-144)/(66-80) 130/70 (05/22 2045) SpO2:  [94 %-100 %] 94 % (05/22 2045) Weight:  [190 lb (86.2 kg)] 190 lb (86.2 kg) (05/22 1019)A  Intake/Output from previous day: 05/22 0701 - 05/23 0700 In: 2445 [P.O.:590; I.V.:1855] Out: 500 [Urine:500] Intake/Output this shift: Total I/O In: 1560 [P.O.:360; I.V.:1200] Out: 500 [Urine:500]  Past Medical History:  Diagnosis Date  . Diabetes mellitus   . GERD (gastroesophageal reflux disease)   . Hyperlipidemia   . Hypertension   . Left ureteral calculus   . Microhematuria   . Umbilical hernia      Physical Exam:  Lungs - Normal respiratory effort, chest expands symmetrically.  Abdomen - Soft, non-tender & non-distended.  Lab Results:  Recent Labs  10/04/16 0451 10/04/16 0746 10/05/16 0612  WBC 13.3*  --  10.1  HGB 12.8* 12.9* 11.8*  HCT 40.3 38.0* 36.7*   BMET  Recent Labs  10/04/16 0451 10/04/16 0746  NA 135 137  K 4.2 4.6  CL 100* 101  CO2 24  --   GLUCOSE 253* 249*  BUN 21* 22*  CREATININE 1.89* 1.60*  CALCIUM 9.5  --    No results for input(s): LABURIN in the last 72 hours. No results found for this or any previous visit.  Studies/Results: Dg C-arm 1-60 Min-no Report  Result Date: 10/04/2016 Fluoroscopy was utilized by the requesting physician.  No radiographic interpretation.   Ct Renal Stone Study  Result Date: 10/04/2016 CLINICAL DATA:  61 year old diabetic hypertensive male with right lower quadrant pain radiating to right flank. History of periumbilical hernia and kidney stones (post lithotripsy). Initial encounter. EXAM: CT ABDOMEN AND PELVIS WITHOUT CONTRAST TECHNIQUE: Multidetector CT imaging of the abdomen and pelvis was performed following the standard protocol without IV contrast. COMPARISON:  08/05/2011. FINDINGS: Lower chest: Basilar scarring/ atelectasis. Heart size within normal limits. Hepatobiliary: Top-normal size fatty liver. Taking into account limitation by non contrast imaging, there is liver lesion. No calcified gallstones. Pancreas: Taking into account limitation by non contrast imaging, no mass or inflammation. Spleen: Taking into account limitation by non contrast imaging, no worrisome mass or enlargement. Adrenals/Urinary Tract: Proximal right ureteral 4 x 3 x 4  mm obstructing stone with moderate right hydronephrosis. Parapelvic cysts slightly limited evaluation of the degree of hydronephrosis. Obstructing stone is located 16.9 cm proximal to the right ureteral vesicle junction. 9 mm and 8 mm central nonobstructing stones mid aspect  right collecting system. 8 x 7 mm proximal left ureteral obstructing stone spanning over 1.4 cm causes hydronephrosis. Degree of hydronephrosis is difficult to adequately assess secondary to presence of parapelvic cysts and lack of contrast. Obstructing stone is located 14 cm proximal to the ureteral vesical junction. Left lower pole 6 and 7 mm nonobstructing calculi. Taking into account limitation by non contrast imaging, no obvious renal mass. No adrenal lesion. Under distended noncontrast filled views of the urinary bladder with minimal impression upon the bladder base by slightly lobulated prostate gland. Stomach/Bowel: Evaluation of portions of bowel limited by lack of distension and presence of stool. No extraluminal bowel inflammatory process, free fluid or free air. Specifically, no inflammation surrounds the appendix. Vascular/Lymphatic: Scattered calcified plaque without abdominal aortic aneurysm. No adenopathy. Reproductive: Minimally lobulated prostate gland with minimal impression upon the bladder base. Other: Peri umbilical fat and vessel containing hernia with defect measuring up to 2.3 cm and protruding fat/ vessels measuring up to 5.8 cm. Previously this measured up to 3 cm. Musculoskeletal: Tiny sclerotic foci L3, L4 and S1 without change. Tiny sclerotic focus right acetabulum. Findings may represent bone islands assuming patient does not have prostate cancer. Sclerotic appearance of the ilium adjacent to sacroiliac joint unchanged and may be related to degenerative changes. Degenerative changes lumbar spine IMPRESSION: Proximal right ureteral 4 x 3 x 4 mm obstructing stone with moderate right hydronephrosis. Parapelvic cysts slightly limited evaluation of the degree of hydronephrosis. Obstructing stone is located 16.9 cm proximal to the right ureteral vesicle junction. 9 mm and 8 mm central nonobstructing stones mid aspect right collecting system. 8 x 7 mm proximal left ureteral obstructing stone  spanning over 1.4 cm causes hydronephrosis. Degree of hydronephrosis is difficult to adequately assess secondary to presence of parapelvic cysts and lack of contrast. Obstructing stone is located 14 cm proximal to the ureteral vesical junction. Left lower pole 6 and 7 mm nonobstructing calculi. No extraluminal bowel inflammatory process, free fluid or free air. Specifically, no inflammation surrounds the appendix. Top-normal size fatty liver. Minimally lobulated prostate gland with minimal impression upon the bladder base. Tiny sclerotic foci L3, L4 and S1 without change. Tiny sclerotic focus right acetabulum. Findings may represent bone islands assuming patient does not have prostate cancer. Sclerotic appearance of the ilium adjacent to sacroiliac joint unchanged and may be related to degenerative changes. Electronically Signed   By: Lacy DuverneySteven  Olson M.D.   On: 10/04/2016 07:24      Garnett FarmOTTELIN,Halaina Vanduzer C 10/05/2016, 6:35 AM

## 2016-10-05 NOTE — Discharge Summary (Signed)
Physician Discharge Summary  Austin Gould ZOX:[id number] DOB: 09/05/1955 DOA: 10/04/2016  PCP: Barbie BannerWilson, Fred H, MD  Admit date: 10/04/2016 Discharge date: 10/05/2016  Admitted From: Home Disposition: Home  Recommendations for Outpatient Follow-up:  1. Follow up with PCP in 1-2 weeks 2. Follow up with Urology Dr. Vernie Ammonsttelin within 1 week 3. Please obtain CMP/CBC within one week 4. Hold Metformin and Benazapril until re-evaluated by PCP and had labwork done 5. Please follow up on the following pending results: Urine Cx  Home Health: No Equipment/Devices: None  Discharge Condition: Stable CODE STATUS: FULL CODE Diet recommendation: Heart Healthy / Carb Modified   Brief/Interim Summary: Austin Gould is a 61 y.o. male with medical history significant of nephrolithiasis, type 2 diabetes, hypertension and dyslipidemia who presented to the ED for evaluation with a chief complaint of Bilateral flank pain with right worse than left that started yesterday morning. Per patient, he woke up morning with bilateral flank pain. He rates the pain as 10/10, dull in nature, starting in his bilateral lower abdominal area sending to the bilateral flank area. There is no radiation of the pain. There is no particular aggravating factor, pain is somewhat relieved by IV narcotics. He denies any fever. He did have some hematuria, but denies any dysuria or frequency of urination. He claims that the right flank area hurts more than the left. He was found to have Bilateral Hydronephrosis due to ureteral stones and Urology was consulted. Patient underwent cystoscopy and bilateral retrograde pyelograms wand bilateral Double-J Stent placement. He significantly improved and AKI was improving. Urology cleared the patient and will follow up with him as an outpatient to schedule definitive treatment of his bilateral ureteral calculi. Patient deemed medically stable and will follow up with PCP at D/C.  Discharge Diagnoses:   Active Problems:   Ureteral stone with hydronephrosis   HTN (hypertension)   DM (diabetes mellitus), type 2 (HCC)   GERD (gastroesophageal reflux disease)   Dyslipidemia  Bilateral hydronephrosis due to ureteral stone s/p Bilateral Double J Stenting:  -Was Kept nothing by mouth-until procedure and supportive care with IV fluids and as needed narcotics.  -Changed IV Abx to Po Ceftin as patient as Bactrim Allergy -Urology Did stenting on 5/22 and patient did well -Cleared from Urology Standpoint and will need to follow up within 1 week.   Acute kidney injury:  -Probably obstructive uropathy and improving -Hold Benazapril and Metformin until re-evaluated by PCP  Hypertension: -Resume Home Antihypertensives execept Benazpril -Follow up with PCP  DM-2:  -Held oral Metformin -Had SSI while Hospitalized -Follow up with PCP   Dyslipidemia:  -Resume Home Statin  GERD:  -C/w Home Nexium  Discharge Instructions  Discharge Instructions    Call MD for:  difficulty breathing, headache or visual disturbances    Complete by:  As directed    Call MD for:  extreme fatigue    Complete by:  As directed    Call MD for:  persistant dizziness or light-headedness    Complete by:  As directed    Call MD for:  persistant nausea and vomiting    Complete by:  As directed    Call MD for:  severe uncontrolled pain    Complete by:  As directed    Call MD for:  temperature >100.4    Complete by:  As directed    Diet - low sodium heart healthy    Complete by:  As directed    Discharge instructions  Complete by:  As directed    Follow up with PCP and Urology as an outpatient. Take all medications as prescribed. If symptoms change or worsen please return to the ED for evaluation.   Increase activity slowly    Complete by:  As directed      Allergies as of 10/05/2016      Reactions   Ciprofloxacin Hives   Sulfamethoxazole-trimethoprim Hives      Medication List    STOP taking  these medications   metFORMIN 1000 MG tablet Commonly known as:  GLUCOPHAGE   olmesartan 20 MG tablet Commonly known as:  BENICAR     TAKE these medications   aspirin EC 81 MG tablet Take 81 mg by mouth daily.   atorvastatin 40 MG tablet Commonly known as:  LIPITOR Take 40 mg by mouth every morning.   cefUROXime 250 MG tablet Commonly known as:  CEFTIN Take 1 tablet (250 mg total) by mouth 2 (two) times daily with a meal.   esomeprazole 40 MG capsule Commonly known as:  NEXIUM Take 40 mg by mouth daily as needed (reflux).   famotidine 20 MG tablet Commonly known as:  PEPCID Take 20 mg by mouth 2 (two) times daily as needed for heartburn. Heart burn   Fish Oil 1000 MG Cpdr Take 1,000 mg by mouth 2 (two) times daily.   GARLIC PO Take 1 capsule by mouth 2 (two) times daily.   nystatin cream Commonly known as:  MYCOSTATIN Apply 1 application topically daily as needed for dry skin.   oxyCODONE 5 MG immediate release tablet Commonly known as:  Oxy IR/ROXICODONE Take 1 tablet (5 mg total) by mouth every 4 (four) hours as needed for moderate pain.   senna-docusate 8.6-50 MG tablet Commonly known as:  Senokot-S Take 1 tablet by mouth at bedtime as needed for mild constipation.      Follow-up Information    Call Ihor Gully, MD.   Specialty:  Urology Why:  For an appointment in ~1 week when you get home. Contact information: [address] [place] [address] [phone]        Barbie Banner, MD. Call in 1 week(s).   Specialty:  Family Medicine Contact information: [address] [place] [address] [phone]          Allergies  Allergen Reactions  . Ciprofloxacin Hives  . Sulfamethoxazole-Trimethoprim Hives    Consultations:  Urology  Procedures/Studies: Dg C-arm 1-60 Min-no Report  Result Date: 10/04/2016 Fluoroscopy was utilized by the requesting physician.  No radiographic interpretation.   Ct Renal Stone Study  Result  Date: 10/04/2016 CLINICAL DATA:  61 year old diabetic hypertensive male with right lower quadrant pain radiating to right flank. History of periumbilical hernia and kidney stones (post lithotripsy). Initial encounter. EXAM: CT ABDOMEN AND PELVIS WITHOUT CONTRAST TECHNIQUE: Multidetector CT imaging of the abdomen and pelvis was performed following the standard protocol without IV contrast. COMPARISON:  08/05/2011. FINDINGS: Lower chest: Basilar scarring/ atelectasis. Heart size within normal limits. Hepatobiliary: Top-normal size fatty liver. Taking into account limitation by non contrast imaging, there is liver lesion. No calcified gallstones. Pancreas: Taking into account limitation by non contrast imaging, no mass or inflammation. Spleen: Taking into account limitation by non contrast imaging, no worrisome mass or enlargement. Adrenals/Urinary Tract: Proximal right ureteral 4 x 3 x 4 mm obstructing stone with moderate right hydronephrosis. Parapelvic cysts slightly limited evaluation of the degree of hydronephrosis. Obstructing stone is located 16.9 cm proximal to  the right ureteral vesicle junction. 9 mm and 8 mm central nonobstructing stones mid aspect right collecting system. 8 x 7 mm proximal left ureteral obstructing stone spanning over 1.4 cm causes hydronephrosis. Degree of hydronephrosis is difficult to adequately assess secondary to presence of parapelvic cysts and lack of contrast. Obstructing stone is located 14 cm proximal to the ureteral vesical junction. Left lower pole 6 and 7 mm nonobstructing calculi. Taking into account limitation by non contrast imaging, no obvious renal mass. No adrenal lesion. Under distended noncontrast filled views of the urinary bladder with minimal impression upon the bladder base by slightly lobulated prostate gland. Stomach/Bowel: Evaluation of portions of bowel limited by lack of distension and presence of stool. No extraluminal bowel inflammatory process, free fluid or  free air. Specifically, no inflammation surrounds the appendix. Vascular/Lymphatic: Scattered calcified plaque without abdominal aortic aneurysm. No adenopathy. Reproductive: Minimally lobulated prostate gland with minimal impression upon the bladder base. Other: Peri umbilical fat and vessel containing hernia with defect measuring up to 2.3 cm and protruding fat/ vessels measuring up to 5.8 cm. Previously this measured up to 3 cm. Musculoskeletal: Tiny sclerotic foci L3, L4 and S1 without change. Tiny sclerotic focus right acetabulum. Findings may represent bone islands assuming patient does not have prostate cancer. Sclerotic appearance of the ilium adjacent to sacroiliac joint unchanged and may be related to degenerative changes. Degenerative changes lumbar spine IMPRESSION: Proximal right ureteral 4 x 3 x 4 mm obstructing stone with moderate right hydronephrosis. Parapelvic cysts slightly limited evaluation of the degree of hydronephrosis. Obstructing stone is located 16.9 cm proximal to the right ureteral vesicle junction. 9 mm and 8 mm central nonobstructing stones mid aspect right collecting system. 8 x 7 mm proximal left ureteral obstructing stone spanning over 1.4 cm causes hydronephrosis. Degree of hydronephrosis is difficult to adequately assess secondary to presence of parapelvic cysts and lack of contrast. Obstructing stone is located 14 cm proximal to the ureteral vesical junction. Left lower pole 6 and 7 mm nonobstructing calculi. No extraluminal bowel inflammatory process, free fluid or free air. Specifically, no inflammation surrounds the appendix. Top-normal size fatty liver. Minimally lobulated prostate gland with minimal impression upon the bladder base. Tiny sclerotic foci L3, L4 and S1 without change. Tiny sclerotic focus right acetabulum. Findings may represent bone islands assuming patient does not have prostate cancer. Sclerotic appearance of the ilium adjacent to sacroiliac joint unchanged  and may be related to degenerative changes. Electronically Signed   By: Lacy Duverney M.D.   On: 10/04/2016 07:24    Subjective: Seen and examined and was doing well. Had no complaints and had improved. Ready to go home.   Discharge Exam: Vitals:   10/04/16 2045 10/05/16 0600  BP: 130/70 133/76  Pulse: 84 76  Resp: 18 18  Temp: 98.8 F (37.1 C) 98.7 F (37.1 C)   Vitals:   10/04/16 1200 10/04/16 1212 10/04/16 2045 10/05/16 0600  BP: 130/72 140/66 130/70 133/76  Pulse: 89 87 84 76  Resp: 15 16 18 18   Temp: 98.2 F (36.8 C) 98.5 F (36.9 C) 98.8 F (37.1 C) 98.7 F (37.1 C)  TempSrc:   Oral Oral  SpO2: 97% 98% 94% 97%  Weight:      Height:       General: Pt is alert, awake, not in acute distress Cardiovascular: RRR, S1/S2 +, no rubs, no gallops Respiratory: CTA bilaterally, no wheezing, no rhonchi Abdominal: Soft, NT, ND, bowel sounds + Extremities: no edema, no  cyanosis  The results of significant diagnostics from this hospitalization (including imaging, microbiology, ancillary and laboratory) are listed below for reference.    Microbiology: Recent Results (from the past 240 hour(s))  Urine culture     Status: None   Collection Time: 10/04/16  5:50 AM  Result Value Ref Range Status   Specimen Description URINE, CLEAN CATCH  Final   Special Requests NONE  Final   Culture   Final    NO GROWTH Performed at [facility], [address] [place], [place] [address]    Report Status 10/05/2016 FINAL  Final    Labs: BNP (last 3 results) No results for input(s): BNP in the last 8760 hours. Basic Metabolic Panel:  Recent Labs Lab 10/04/16 0451 10/04/16 0746 10/05/16 0612  NA 135 137 137  K 4.2 4.6 3.9  CL 100* 101 106  CO2 24  --  23  GLUCOSE 253* 249* 178*  BUN 21* 22* 15  CREATININE 1.89* 1.60* 1.34*  CALCIUM 9.5  --  8.6*   Liver Function Tests:  Recent Labs Lab 10/04/16 0451  AST 19  ALT 18  ALKPHOS 35*  BILITOT 0.7  PROT 7.0  ALBUMIN 3.9     Recent Labs Lab 10/04/16 0451  LIPASE 27   No results for input(s): AMMONIA in the last 168 hours. CBC:  Recent Labs Lab 10/04/16 0451 10/04/16 0746 10/05/16 0612  WBC 13.3*  --  10.1  HGB 12.8* 12.9* 11.8*  HCT 40.3 38.0* 36.7*  MCV 83.6  --  83.4  PLT 287  --  254   Cardiac Enzymes: No results for input(s): CKTOTAL, CKMB, CKMBINDEX, TROPONINI in the last 168 hours. BNP: Invalid input(s): POCBNP CBG:  Recent Labs Lab 10/04/16 1325 10/04/16 1853 10/04/16 2125 10/05/16 0727 10/05/16 1155  GLUCAP 185* 196* 198* 186* 210*   D-Dimer No results for input(s): DDIMER in the last 72 hours. Hgb A1c No results for input(s): HGBA1C in the last 72 hours. Lipid Profile No results for input(s): CHOL, HDL, LDLCALC, TRIG, CHOLHDL, LDLDIRECT in the last 72 hours. Thyroid function studies No results for input(s): TSH, T4TOTAL, T3FREE, THYROIDAB in the last 72 hours.  Invalid input(s): FREET3 Anemia work up No results for input(s): VITAMINB12, FOLATE, FERRITIN, TIBC, IRON, RETICCTPCT in the last 72 hours. Urinalysis    Component Value Date/Time   COLORURINE YELLOW 10/04/2016 0550   APPEARANCEUR HAZY (A) 10/04/2016 0550   LABSPEC 1.011 10/04/2016 0550   PHURINE 5.0 10/04/2016 0550   GLUCOSEU 50 (A) 10/04/2016 0550   HGBUR LARGE (A) 10/04/2016 0550   BILIRUBINUR NEGATIVE 10/04/2016 0550   KETONESUR NEGATIVE 10/04/2016 0550   PROTEINUR NEGATIVE 10/04/2016 0550   NITRITE NEGATIVE 10/04/2016 0550   LEUKOCYTESUR SMALL (A) 10/04/2016 0550   Sepsis Labs Invalid input(s): PROCALCITONIN,  WBC,  LACTICIDVEN Microbiology Recent Results (from the past 240 hour(s))  Urine culture     Status: None   Collection Time: 10/04/16  5:50 AM  Result Value Ref Range Status   Specimen Description URINE, CLEAN CATCH  Final   Special Requests NONE  Final   Culture   Final    NO GROWTH Performed at [facility], [address], [place], [place] [address]    Report Status  10/05/2016 FINAL  Final   Time coordinating discharge: Over 35 minutes  SIGNED:  Merlene Laughter, DO [facility] 10/05/2016, 8:43 PM Pager [phone]  If 7PM-7AM, please contact night-coverage [email or web address] Password [email or web address]

## 2016-10-11 ENCOUNTER — Other Ambulatory Visit: Payer: Self-pay | Admitting: Urology

## 2016-10-12 ENCOUNTER — Encounter (HOSPITAL_COMMUNITY): Payer: Self-pay | Admitting: *Deleted

## 2016-10-12 ENCOUNTER — Other Ambulatory Visit: Payer: Self-pay | Admitting: Urology

## 2016-10-17 ENCOUNTER — Ambulatory Visit (HOSPITAL_COMMUNITY): Payer: Managed Care, Other (non HMO)

## 2016-10-17 ENCOUNTER — Ambulatory Visit (HOSPITAL_COMMUNITY)
Admission: RE | Admit: 2016-10-17 | Discharge: 2016-10-17 | Disposition: A | Discharge status: Home / Self Care | Payer: Managed Care, Other (non HMO) | Source: Ambulatory Visit | Attending: Urology | Admitting: Urology

## 2016-10-17 ENCOUNTER — Encounter (HOSPITAL_COMMUNITY)
Admission: RE | Disposition: A | Discharge status: Home / Self Care | Payer: Self-pay | Source: Ambulatory Visit | Attending: Urology

## 2016-10-17 ENCOUNTER — Encounter (HOSPITAL_COMMUNITY): Payer: Self-pay | Admitting: *Deleted

## 2016-10-17 DIAGNOSIS — Z87442 Personal history of urinary calculi: Secondary | ICD-10-CM | POA: Diagnosis not present

## 2016-10-17 DIAGNOSIS — Z87891 Personal history of nicotine dependence: Secondary | ICD-10-CM | POA: Diagnosis not present

## 2016-10-17 DIAGNOSIS — Z79899 Other long term (current) drug therapy: Secondary | ICD-10-CM | POA: Diagnosis not present

## 2016-10-17 DIAGNOSIS — N202 Calculus of kidney with calculus of ureter: Secondary | ICD-10-CM | POA: Diagnosis present

## 2016-10-17 DIAGNOSIS — E119 Type 2 diabetes mellitus without complications: Secondary | ICD-10-CM | POA: Insufficient documentation

## 2016-10-17 DIAGNOSIS — Z881 Allergy status to other antibiotic agents status: Secondary | ICD-10-CM | POA: Diagnosis not present

## 2016-10-17 DIAGNOSIS — Z7982 Long term (current) use of aspirin: Secondary | ICD-10-CM | POA: Insufficient documentation

## 2016-10-17 DIAGNOSIS — K219 Gastro-esophageal reflux disease without esophagitis: Secondary | ICD-10-CM | POA: Insufficient documentation

## 2016-10-17 DIAGNOSIS — Z882 Allergy status to sulfonamides status: Secondary | ICD-10-CM | POA: Diagnosis not present

## 2016-10-17 DIAGNOSIS — I10 Essential (primary) hypertension: Secondary | ICD-10-CM | POA: Diagnosis not present

## 2016-10-17 DIAGNOSIS — E785 Hyperlipidemia, unspecified: Secondary | ICD-10-CM | POA: Insufficient documentation

## 2016-10-17 DIAGNOSIS — N201 Calculus of ureter: Secondary | ICD-10-CM | POA: Insufficient documentation

## 2016-10-17 HISTORY — PX: EXTRACORPOREAL SHOCK WAVE LITHOTRIPSY: SHX1557

## 2016-10-17 HISTORY — DX: Personal history of urinary calculi: Z87.442

## 2016-10-17 LAB — GLUCOSE, CAPILLARY: Glucose-Capillary: 178 mg/dL — ABNORMAL HIGH (ref 65–99)

## 2016-10-17 SURGERY — LITHOTRIPSY, ESWL
Anesthesia: LOCAL | Laterality: Left

## 2016-10-17 MED ORDER — CEFAZOLIN SODIUM-DEXTROSE 2-4 GM/100ML-% IV SOLN
2.0000 g | Freq: Once | INTRAVENOUS | Status: AC
  Administered 2016-10-17: 2 g via INTRAVENOUS
  Filled 2016-10-17: qty 100

## 2016-10-17 MED ORDER — TAMSULOSIN HCL 0.4 MG PO CAPS: 0.4000 mg | ORAL_CAPSULE | Freq: Every day | ORAL | 1 refills | Status: DC

## 2016-10-17 MED ORDER — SODIUM CHLORIDE 0.9 % IV SOLN
INTRAVENOUS | Status: DC
  Administered 2016-10-17: 09:00:00 via INTRAVENOUS

## 2016-10-17 MED ORDER — DIPHENHYDRAMINE HCL 25 MG PO CAPS
25.0000 mg | ORAL_CAPSULE | ORAL | Status: AC
  Administered 2016-10-17: 25 mg via ORAL
  Filled 2016-10-17: qty 1

## 2016-10-17 MED ORDER — DIAZEPAM 5 MG PO TABS
10.0000 mg | ORAL_TABLET | ORAL | Status: AC
  Administered 2016-10-17: 10 mg via ORAL
  Filled 2016-10-17: qty 2

## 2016-10-17 MED ORDER — OXYCODONE-ACETAMINOPHEN 5-325 MG PO TABS: 1.0000 | ORAL_TABLET | Freq: Four times a day (QID) | ORAL | 0 refills | Status: DC | PRN

## 2016-10-17 MED ORDER — PROMETHAZINE HCL 25 MG PO TABS: 25.0000 mg | ORAL_TABLET | Freq: Four times a day (QID) | ORAL | 0 refills | Status: DC | PRN

## 2016-10-17 NOTE — H&P (Signed)
Austin Gould is an 61 y.o. male.    Chief Complaint: Pre-op LEFT shockwave Lithotripsy  HPI:   Bilateral Ureteral Stones - s/p bilateral JJ stenting 09/2016 for small right and large left ureteral stones. He has elected to undergo left SWL in discussion with Dr. Vernie Ammonsttelin for maangment of left stone. Stents remian in place. NO interval fevers.   Past Medical History:  Diagnosis Date  . Diabetes mellitus   . GERD (gastroesophageal reflux disease)   . History of kidney stones   . Hyperlipidemia   . Hypertension   . Left ureteral calculus   . Microhematuria   . Umbilical hernia     Past Surgical History:  Procedure Laterality Date  . CYSTOSCOPY W/ URETERAL STENT PLACEMENT Bilateral 10/04/2016   Procedure: CYSTOSCOPY WITH RETROGRADE PYELOGRAM/BILATERAL URETERAL STENT PLACEMENT;  Surgeon: Ihor Gullyttelin, Mark, MD;  Location: [facility];  Service: Urology;  Laterality: Bilateral;  . CYSTOSCOPY WITH URETEROSCOPY  10/19/2011   Procedure: CYSTOSCOPY WITH URETEROSCOPY;  Surgeon: Milford Cageaniel Young Woodruff, MD;  [facility] [place];  Service: Urology;  Laterality: Left;  cystoscopy, left ureteroscopy, holmium laser, lithotripsey left ureter stent placement   . EXTRACORPOREAL SHOCK WAVE LITHOTRIPSY  08-18-2011   LEFT    No family history on file. Social History:  reports that he has quit smoking. He quit after 25.00 years of use. He has never used smokeless tobacco. He reports that he drinks alcohol. He reports that he does not use drugs.  Allergies:  Allergies  Allergen Reactions  . Ciprofloxacin Hives  . Sulfamethoxazole-Trimethoprim Hives    No prescriptions prior to admission.    No results found for this or any previous visit (from the past 48 hour(s)). No results found.  Review of Systems  Constitutional: Negative.  Negative for chills and fever.  HENT: Negative.   Eyes: Negative.   Respiratory: Negative.   Cardiovascular: Negative.   Gastrointestinal: Negative.    Genitourinary: Negative.   Musculoskeletal: Negative.   Skin: Negative.   Neurological: Negative.   Endo/Heme/Allergies: Negative.   Psychiatric/Behavioral: Negative.     There were no vitals taken for this visit. Physical Exam  Constitutional: He appears well-developed.  HENT:  Head: Normocephalic.  Eyes: Pupils are equal, round, and reactive to light.  Neck: Normal range of motion.  Cardiovascular: Normal rate.   Respiratory: Effort normal.  GI: Soft.  Genitourinary: Penis normal.  Musculoskeletal: Normal range of motion.  Neurological: He is alert.  Skin: Skin is warm.  Psychiatric: He has a normal mood and affect.     Assessment/Plan  Proceed as planned with LEFT SWL today. He understands may require staged approach. Risks, benefits, alternatives, expected peri-op course discussed previously and reiterated today.   Sebastian AcheMANNY, Annisa Mazzarella, MD 10/17/2016, 6:34 AM

## 2016-10-17 NOTE — Brief Op Note (Signed)
10/17/2016  11:31 AM  PATIENT:  Austin Gould  61 y.o. male  PRE-OPERATIVE DIAGNOSIS:  LEFT URETERAL STONE  POST-OPERATIVE DIAGNOSIS:  * No post-op diagnosis entered *  PROCEDURE:  Procedure(s): LEFT EXTRACORPOREAL SHOCK WAVE LITHOTRIPSY ([facility]) (Left)  SURGEON:  Surgeon(s) and Role:    Alexis Frock, MD - Primary  PHYSICIAN ASSISTANT:   ASSISTANTS: none   ANESTHESIA:   MAC  EBL:  No intake/output data recorded.  BLOOD ADMINISTERED:none  DRAINS: none   LOCAL MEDICATIONS USED:  NONE  SPECIMEN:  No Specimen  DISPOSITION OF SPECIMEN:  N/A  COUNTS:  YES  TOURNIQUET:  * No tourniquets in log *  DICTATION: .Note written in paper chart  PLAN OF CARE: Discharge to home after PACU  PATIENT DISPOSITION:  PACU - hemodynamically stable.   Delay start of Pharmacological VTE agent (>24hrs) due to surgical blood loss or risk of bleeding: not applicable

## 2016-10-17 NOTE — Discharge Instructions (Addendum)
Lithotripsy, Care After °This sheet gives you information about how to care for yourself after your procedure. Your health care provider may also give you more specific instructions. If you have problems or questions, contact your health care provider. °What can I expect after the procedure? °After the procedure, it is common to have: °· Some blood in your urine. This should only last for a few days. °· Soreness in your back, sides, or upper abdomen for a few days. °· Blotches or bruises on your back where the pressure wave entered the skin. °· Pain, discomfort, or nausea when pieces (fragments) of the kidney stone move through the tube that carries urine from the kidney to the bladder (ureter). Stone fragments may pass soon after the procedure, but they may continue to pass for up to 4-8 weeks. °? If you have severe pain or nausea, contact your health care provider. This may be caused by a large stone that was not broken up, and this may mean that you need more treatment. °· Some pain or discomfort during urination. °· Some pain or discomfort in the lower abdomen or (in men) at the base of the penis. ° °Follow these instructions at home: °Medicines °· Take over-the-counter and prescription medicines only as told by your health care provider. °· If you were prescribed an antibiotic medicine, take it as told by your health care provider. Do not stop taking the antibiotic even if you start to feel better. °· Do not drive for 24 hours if you were given a medicine to help you relax (sedative). °· Do not drive or use heavy machinery while taking prescription pain medicine. °Eating and drinking °· Drink enough water and fluids to keep your urine clear or pale yellow. This helps any remaining pieces of the stone to pass. It can also help prevent new stones from forming. °· Eat plenty of fresh fruits and vegetables. °· Follow instructions from your health care provider about eating and drinking restrictions. You may be  instructed: °? To reduce how much salt (sodium) you eat or drink. Check ingredients and nutrition facts on packaged foods and beverages. °? To reduce how much meat you eat. °· Eat the recommended amount of calcium for your age and gender. Ask your health care provider how much calcium you should have. °General instructions °· Get plenty of rest. °· Most people can resume normal activities 1-2 days after the procedure. Ask your health care provider what activities are safe for you. °· If directed, strain all urine through the strainer that was provided by your health care provider. °? Keep all fragments for your health care provider to see. Any stones that are found may be sent to a medical lab for examination. The stone may be as small as a grain of salt. °· Keep all follow-up visits as told by your health care provider. This is important. °Contact a health care provider if: °· You have pain that is severe or does not get better with medicine. °· You have nausea that is severe or does not go away. °· You have blood in your urine longer than your health care provider told you to expect. °· You have more blood in your urine. °· You have pain during urination that does not go away. °· You urinate more frequently than usual and this does not go away. °· You develop a rash or any other possible signs of an allergic reaction. °Get help right away if: °· You have severe pain in   your back, sides, or upper abdomen.  You have severe pain while urinating.  Your urine is very dark red.  You have blood in your stool (feces).  You cannot pass any urine at all.  You feel a strong urge to urinate after emptying your bladder.  You have a fever or chills.  You develop shortness of breath, difficulty breathing, or chest pain.  You have severe nausea that leads to persistent vomiting.  You faint. Summary  After this procedure, it is common to have some pain, discomfort, or nausea when pieces (fragments) of the  kidney stone move through the tube that carries urine from the kidney to the bladder (ureter). If this pain or nausea is severe, however, you should contact your health care provider.  Most people can resume normal activities 1-2 days after the procedure. Ask your health care provider what activities are safe for you.  Drink enough water and fluids to keep your urine clear or pale yellow. This helps any remaining pieces of the stone to pass, and it can help prevent new stones from forming.  If directed, strain your urine and keep all fragments for your health care provider to see. Fragments or stones may be as small as a grain of salt.  Get help right away if you have severe pain in your back, sides, or upper abdomen or have severe pain while urinating. This information is not intended to replace advice given to you by your health care provider. Make sure you discuss any questions you have with your health care provider. Document Released: 05/22/2007 Document Revised: 03/23/2016 Document Reviewed: 03/23/2016 Elsevier Interactive Patient Education  2017 [employer]. Moderate Conscious Sedation, Adult, Care After These instructions provide you with information about caring for yourself after your procedure. Your health care provider may also give you more specific instructions. Your treatment has been planned according to current medical practices, but problems sometimes occur. Call your health care provider if you have any problems or questions after your procedure. What can I expect after the procedure? After your procedure, it is common:  To feel sleepy for several hours.  To feel clumsy and have poor balance for several hours.  To have poor judgment for several hours.  To vomit if you eat too soon.  Follow these instructions at home: For at least 24 hours after the procedure:   Do not: ? Participate in activities where you could fall or become injured. ? Drive. ? Use heavy  machinery. ? Drink alcohol. ? Take sleeping pills or medicines that cause drowsiness. ? Make important decisions or sign legal documents. ? Take care of children on your own.  Rest. Eating and drinking  Follow the diet recommended by your health care provider.  If you vomit: ? Drink water, juice, or soup when you can drink without vomiting. ? Make sure you have little or no nausea before eating solid foods. General instructions  Have a responsible adult stay with you until you are awake and alert.  Take over-the-counter and prescription medicines only as told by your health care provider.  If you smoke, do not smoke without supervision.  Keep all follow-up visits as told by your health care provider. This is important. Contact a health care provider if:  You keep feeling nauseous or you keep vomiting.  You feel light-headed.  You develop a rash.  You have a fever. Get help right away if:  You have trouble breathing. This information is not intended to replace advice  given to you by your health care provider. Make sure you discuss any questions you have with your health care provider. Document Released: 02/20/2013 Document Revised: 10/05/2015 Document Reviewed: 08/22/2015 Elsevier Interactive Patient Education  2018 [employer]   1 - You may have urinary urgency (bladder spasms), pass small stone fragments,  and bloody urine on / off x few days. This is normal.  2 - Call MD or go to ER for fever >102, severe pain / nausea / vomiting not relieved by medications, or acute change in medical status.

## 2016-10-18 ENCOUNTER — Encounter (HOSPITAL_COMMUNITY): Payer: Self-pay | Admitting: Urology

## 2016-11-17 ENCOUNTER — Encounter (HOSPITAL_BASED_OUTPATIENT_CLINIC_OR_DEPARTMENT_OTHER): Payer: Self-pay | Admitting: *Deleted

## 2016-11-17 ENCOUNTER — Other Ambulatory Visit: Payer: Self-pay | Admitting: Urology

## 2016-11-18 ENCOUNTER — Encounter (HOSPITAL_BASED_OUTPATIENT_CLINIC_OR_DEPARTMENT_OTHER): Payer: Self-pay | Admitting: *Deleted

## 2016-11-18 NOTE — Progress Notes (Addendum)
[facility],istat,Ekg on arrival-Npo after Mn-may take lipitor,pyridium with small water in am.

## 2016-11-25 ENCOUNTER — Ambulatory Visit (HOSPITAL_COMMUNITY): Payer: Managed Care, Other (non HMO)

## 2016-11-25 ENCOUNTER — Ambulatory Visit (HOSPITAL_BASED_OUTPATIENT_CLINIC_OR_DEPARTMENT_OTHER)
Admission: RE | Admit: 2016-11-25 | Discharge: 2016-11-25 | Disposition: A | Discharge status: Home / Self Care | Payer: Managed Care, Other (non HMO) | Source: Ambulatory Visit | Attending: Urology | Admitting: Urology

## 2016-11-25 ENCOUNTER — Other Ambulatory Visit: Payer: Self-pay

## 2016-11-25 ENCOUNTER — Encounter (HOSPITAL_BASED_OUTPATIENT_CLINIC_OR_DEPARTMENT_OTHER): Payer: Self-pay | Admitting: *Deleted

## 2016-11-25 ENCOUNTER — Ambulatory Visit (HOSPITAL_BASED_OUTPATIENT_CLINIC_OR_DEPARTMENT_OTHER): Payer: Managed Care, Other (non HMO) | Admitting: Anesthesiology

## 2016-11-25 ENCOUNTER — Encounter (HOSPITAL_BASED_OUTPATIENT_CLINIC_OR_DEPARTMENT_OTHER)
Admission: RE | Disposition: A | Discharge status: Home / Self Care | Payer: Self-pay | Source: Ambulatory Visit | Attending: Urology

## 2016-11-25 DIAGNOSIS — Z881 Allergy status to other antibiotic agents status: Secondary | ICD-10-CM | POA: Insufficient documentation

## 2016-11-25 DIAGNOSIS — R972 Elevated prostate specific antigen [PSA]: Secondary | ICD-10-CM | POA: Insufficient documentation

## 2016-11-25 DIAGNOSIS — Z7982 Long term (current) use of aspirin: Secondary | ICD-10-CM | POA: Diagnosis not present

## 2016-11-25 DIAGNOSIS — N132 Hydronephrosis with renal and ureteral calculous obstruction: Secondary | ICD-10-CM

## 2016-11-25 DIAGNOSIS — Z7984 Long term (current) use of oral hypoglycemic drugs: Secondary | ICD-10-CM | POA: Diagnosis not present

## 2016-11-25 DIAGNOSIS — N201 Calculus of ureter: Secondary | ICD-10-CM | POA: Diagnosis present

## 2016-11-25 DIAGNOSIS — Z882 Allergy status to sulfonamides status: Secondary | ICD-10-CM | POA: Diagnosis not present

## 2016-11-25 DIAGNOSIS — Z79899 Other long term (current) drug therapy: Secondary | ICD-10-CM | POA: Diagnosis not present

## 2016-11-25 DIAGNOSIS — N401 Enlarged prostate with lower urinary tract symptoms: Secondary | ICD-10-CM | POA: Insufficient documentation

## 2016-11-25 DIAGNOSIS — N23 Unspecified renal colic: Secondary | ICD-10-CM

## 2016-11-25 DIAGNOSIS — I10 Essential (primary) hypertension: Secondary | ICD-10-CM | POA: Diagnosis not present

## 2016-11-25 DIAGNOSIS — Z87442 Personal history of urinary calculi: Secondary | ICD-10-CM | POA: Diagnosis not present

## 2016-11-25 HISTORY — DX: Calculus of ureter: N20.1

## 2016-11-25 HISTORY — PX: CYSTOSCOPY WITH RETROGRADE PYELOGRAM, URETEROSCOPY AND STENT PLACEMENT: SHX5789

## 2016-11-25 HISTORY — PX: HOLMIUM LASER APPLICATION: SHX5852

## 2016-11-25 LAB — POCT I-STAT 4, (NA,K, GLUC, HGB,HCT)
Glucose, Bld: 168 mg/dL — ABNORMAL HIGH (ref 65–99)
HCT: 36 % — ABNORMAL LOW (ref 39.0–52.0)
Hemoglobin: 12.2 g/dL — ABNORMAL LOW (ref 13.0–17.0)
Potassium: 4.1 mmol/L (ref 3.5–5.1)
Sodium: 140 mmol/L (ref 135–145)

## 2016-11-25 SURGERY — CYSTOURETEROSCOPY, WITH RETROGRADE PYELOGRAM AND STENT INSERTION
Anesthesia: General | Laterality: Bilateral

## 2016-11-25 MED ORDER — CEFAZOLIN SODIUM-DEXTROSE 2-4 GM/100ML-% IV SOLN
2.0000 g | INTRAVENOUS | Status: AC
  Administered 2016-11-25: 2 g via INTRAVENOUS
  Filled 2016-11-25: qty 100

## 2016-11-25 MED ORDER — DEXAMETHASONE SODIUM PHOSPHATE 10 MG/ML IJ SOLN
INTRAMUSCULAR | Status: AC
  Filled 2016-11-25: qty 1

## 2016-11-25 MED ORDER — LIDOCAINE 2% (20 MG/ML) 5 ML SYRINGE
INTRAMUSCULAR | Status: AC
  Filled 2016-11-25: qty 5

## 2016-11-25 MED ORDER — LIDOCAINE HCL 2 % EX GEL
CUTANEOUS | Status: AC
  Filled 2016-11-25: qty 5

## 2016-11-25 MED ORDER — HYDROCODONE-ACETAMINOPHEN 7.5-325 MG PO TABS
1.0000 | ORAL_TABLET | Freq: Once | ORAL | Status: DC | PRN
  Filled 2016-11-25: qty 1

## 2016-11-25 MED ORDER — ONDANSETRON HCL 4 MG/2ML IJ SOLN
INTRAMUSCULAR | Status: AC
  Filled 2016-11-25: qty 2

## 2016-11-25 MED ORDER — PROPOFOL 10 MG/ML IV BOLUS
INTRAVENOUS | Status: AC
  Filled 2016-11-25: qty 20

## 2016-11-25 MED ORDER — KETOROLAC TROMETHAMINE 30 MG/ML IJ SOLN
INTRAMUSCULAR | Status: DC | PRN
  Administered 2016-11-25: 30 mg via INTRAVENOUS

## 2016-11-25 MED ORDER — MEPERIDINE HCL 25 MG/ML IJ SOLN
6.2500 mg | INTRAMUSCULAR | Status: DC | PRN
  Filled 2016-11-25: qty 1

## 2016-11-25 MED ORDER — MIDAZOLAM HCL 2 MG/2ML IJ SOLN
INTRAMUSCULAR | Status: AC
  Filled 2016-11-25: qty 2

## 2016-11-25 MED ORDER — OXYCODONE HCL 10 MG PO TABS: 10.0000 mg | ORAL_TABLET | ORAL | 0 refills | Status: DC | PRN

## 2016-11-25 MED ORDER — PHENAZOPYRIDINE HCL 200 MG PO TABS
200.0000 mg | ORAL_TABLET | Freq: Once | ORAL | Status: AC
  Administered 2016-11-25: 200 mg via ORAL
  Filled 2016-11-25: qty 1

## 2016-11-25 MED ORDER — KETOROLAC TROMETHAMINE 30 MG/ML IJ SOLN
INTRAMUSCULAR | Status: AC
  Filled 2016-11-25: qty 1

## 2016-11-25 MED ORDER — PROPOFOL 10 MG/ML IV BOLUS
INTRAVENOUS | Status: DC | PRN
  Administered 2016-11-25: 50 mg via INTRAVENOUS
  Administered 2016-11-25: 150 mg via INTRAVENOUS

## 2016-11-25 MED ORDER — PHENAZOPYRIDINE HCL 200 MG PO TABS: 200.0000 mg | ORAL_TABLET | Freq: Three times a day (TID) | ORAL | 0 refills | Status: DC | PRN

## 2016-11-25 MED ORDER — ONDANSETRON HCL 4 MG/2ML IJ SOLN
INTRAMUSCULAR | Status: DC | PRN
  Administered 2016-11-25: 4 mg via INTRAVENOUS

## 2016-11-25 MED ORDER — LIDOCAINE 2% (20 MG/ML) 5 ML SYRINGE
INTRAMUSCULAR | Status: DC | PRN
  Administered 2016-11-25: 60 mg via INTRAVENOUS

## 2016-11-25 MED ORDER — DEXAMETHASONE SODIUM PHOSPHATE 4 MG/ML IJ SOLN
INTRAMUSCULAR | Status: DC | PRN
  Administered 2016-11-25: 10 mg via INTRAVENOUS

## 2016-11-25 MED ORDER — PROMETHAZINE HCL 25 MG/ML IJ SOLN
6.2500 mg | INTRAMUSCULAR | Status: DC | PRN
  Filled 2016-11-25: qty 1

## 2016-11-25 MED ORDER — FENTANYL CITRATE (PF) 100 MCG/2ML IJ SOLN
INTRAMUSCULAR | Status: DC | PRN
  Administered 2016-11-25: 50 ug via INTRAVENOUS
  Administered 2016-11-25: 25 ug via INTRAVENOUS
  Administered 2016-11-25: 50 ug via INTRAVENOUS
  Administered 2016-11-25: 25 ug via INTRAVENOUS
  Administered 2016-11-25: 50 ug via INTRAVENOUS

## 2016-11-25 MED ORDER — CEFAZOLIN SODIUM-DEXTROSE 2-4 GM/100ML-% IV SOLN
INTRAVENOUS | Status: AC
  Filled 2016-11-25: qty 100

## 2016-11-25 MED ORDER — BELLADONNA ALKALOIDS-OPIUM 16.2-60 MG RE SUPP
RECTAL | Status: AC
  Filled 2016-11-25: qty 1

## 2016-11-25 MED ORDER — FENTANYL CITRATE (PF) 100 MCG/2ML IJ SOLN
INTRAMUSCULAR | Status: AC
  Filled 2016-11-25: qty 2

## 2016-11-25 MED ORDER — LACTATED RINGERS IV SOLN
INTRAVENOUS | Status: DC
  Administered 2016-11-25 (×2): via INTRAVENOUS
  Filled 2016-11-25: qty 1000

## 2016-11-25 MED ORDER — HYDROMORPHONE HCL 1 MG/ML IJ SOLN
0.2500 mg | INTRAMUSCULAR | Status: DC | PRN
  Filled 2016-11-25: qty 0.5

## 2016-11-25 MED ORDER — PHENAZOPYRIDINE HCL 100 MG PO TABS
ORAL_TABLET | ORAL | Status: AC
  Filled 2016-11-25: qty 2

## 2016-11-25 MED ORDER — MIDAZOLAM HCL 5 MG/5ML IJ SOLN
INTRAMUSCULAR | Status: DC | PRN
  Administered 2016-11-25: 2 mg via INTRAVENOUS

## 2016-11-25 SURGICAL SUPPLY — 35 items
BAG DRAIN URO-CYSTO SKYTR STRL (DRAIN) ×2 IMPLANT
BASKET DAKOTA 1.9FR 11X120 (BASKET) ×4 IMPLANT
BASKET LASER NITINOL 1.9FR (BASKET) IMPLANT
BASKET STNLS GEMINI 4WIRE 3FR (BASKET) IMPLANT
BASKET ZERO TIP NITINOL 2.4FR (BASKET) ×2 IMPLANT
CATH INTERMIT  6FR 70CM (CATHETERS) IMPLANT
CATH URET 5FR 28IN CONE TIP (BALLOONS)
CATH URET 5FR 70CM CONE TIP (BALLOONS) IMPLANT
CLOTH BEACON ORANGE TIMEOUT ST (SAFETY) ×2 IMPLANT
ELECT REM PT RETURN 9FT ADLT (ELECTROSURGICAL)
ELECTRODE REM PT RTRN 9FT ADLT (ELECTROSURGICAL) IMPLANT
FIBER LASER FLEXIVA 200 (UROLOGICAL SUPPLIES) ×2 IMPLANT
FIBER LASER FLEXIVA 365 (UROLOGICAL SUPPLIES) IMPLANT
FIBER LASER FLEXIVA 550 (UROLOGICAL SUPPLIES) IMPLANT
FIBER LASER TRAC TIP (UROLOGICAL SUPPLIES) IMPLANT
GLOVE BIO SURGEON STRL SZ8 (GLOVE) ×2 IMPLANT
GOWN STRL REUS W/ TWL LRG LVL3 (GOWN DISPOSABLE) ×1 IMPLANT
GOWN STRL REUS W/ TWL XL LVL3 (GOWN DISPOSABLE) ×1 IMPLANT
GOWN STRL REUS W/TWL LRG LVL3 (GOWN DISPOSABLE) ×1
GOWN STRL REUS W/TWL XL LVL3 (GOWN DISPOSABLE) ×1
GUIDEWIRE 0.038 PTFE COATED (WIRE) IMPLANT
GUIDEWIRE ANG ZIPWIRE 038X150 (WIRE) IMPLANT
GUIDEWIRE STR DUAL SENSOR (WIRE) ×4 IMPLANT
IV NS IRRIG 3000ML ARTHROMATIC (IV SOLUTION) ×4 IMPLANT
KIT BALLIN UROMAX 15FX10 (LABEL) IMPLANT
KIT BALLN UROMAX 15FX4 (MISCELLANEOUS) IMPLANT
KIT BALLN UROMAX 26 75X4 (MISCELLANEOUS)
KIT RM TURNOVER CYSTO AR (KITS) ×2 IMPLANT
MANIFOLD NEPTUNE II (INSTRUMENTS) IMPLANT
PACK CYSTO (CUSTOM PROCEDURE TRAY) ×2 IMPLANT
SET HIGH PRES BAL DIL (LABEL)
SHEATH ACCESS URETERAL 38CM (SHEATH) ×2 IMPLANT
STENT URET 6FRX24 CONTOUR (STENTS) ×2 IMPLANT
TUBE CONNECTING 12X1/4 (SUCTIONS) IMPLANT
WATER STERILE IRR 3000ML UROMA (IV SOLUTION) IMPLANT

## 2016-11-25 NOTE — Anesthesia Procedure Notes (Signed)
Procedure Name: LMA Insertion Date/Time: 11/25/2016 9:26 AM Performed by: Tyrone NineSAUVE, Birdella Sippel F Pre-anesthesia Checklist: Patient identified, Timeout performed, Emergency Drugs available, Suction available and Patient being monitored Patient Re-evaluated:Patient Re-evaluated prior to induction Oxygen Delivery Method: Circle system utilized Preoxygenation: Pre-oxygenation with 100% oxygen Induction Type: IV induction Ventilation: Mask ventilation without difficulty LMA: LMA inserted LMA Size: 4.0 Number of attempts: 1 Placement Confirmation: positive ETCO2 and breath sounds checked- equal and bilateral Tube secured with: Tape Dental Injury: Teeth and Oropharynx as per pre-operative assessment

## 2016-11-25 NOTE — Anesthesia Preprocedure Evaluation (Addendum)
Anesthesia Evaluation  Patient identified by MRN, date of birth, ID band Patient awake    Reviewed: Allergy & Precautions, NPO status , Patient's Chart, lab work & pertinent test results  Airway Mallampati: II  TM Distance: >3 FB Neck ROM: Full    Dental no notable dental hx. (+) Teeth Intact, Dental Advisory Given, Chipped,    Pulmonary former smoker,    Pulmonary exam normal breath sounds clear to auscultation       Cardiovascular hypertension, Pt. on medications Normal cardiovascular exam Rhythm:Regular Rate:Normal     Neuro/Psych negative neurological ROS  negative psych ROS   GI/Hepatic Neg liver ROS, GERD  Medicated and Controlled,  Endo/Other  diabetes, Well Controlled, Type 2, Oral Hypoglycemic AgentsObesity Hyperlipidemia  Renal/GU Renal diseaseBilateral ureteral calculi with hydronephrosis  negative genitourinary   Musculoskeletal negative musculoskeletal ROS (+)   Abdominal (+) + obese,   Peds  Hematology negative hematology ROS (+)   Anesthesia Other Findings   Reproductive/Obstetrics                            Anesthesia Physical Anesthesia Plan  ASA: II  Anesthesia Plan: General   Post-op Pain Management:    Induction: Intravenous  PONV Risk Score and Plan: 4 or greater and Ondansetron, Dexamethasone, Propofol, Midazolam, Promethazine and Treatment may vary due to age or medical condition  Airway Management Planned: LMA  Additional Equipment:   Intra-op Plan:   Post-operative Plan: Extubation in OR  Informed Consent: I have reviewed the patients History and Physical, chart, labs and discussed the procedure including the risks, benefits and alternatives for the proposed anesthesia with the patient or authorized representative who has indicated his/her understanding and acceptance.   Dental advisory given  Plan Discussed with: CRNA, Anesthesiologist and  Surgeon  Anesthesia Plan Comments:         Anesthesia Quick Evaluation

## 2016-11-25 NOTE — H&P (Signed)
HPI: Austin Gould is a 61 year-old male with bilateral ureteral calculi.    The problem is on both sides. He has had stent and eswlfor treatment of his ureteral calculi. Patient denies ureteroscopy and percutaneous lithotomy. This procedure was done 10/17/2016. He did pass stone fragments. This is not his first kidney stone. He does have a stent in place.    He is not currently having flank pain, back pain, groin pain, nausea, vomiting, fever or chills.   He does have dysuria. He does have urgency. He does have frequency.   10/11/16: He returns having had double-J stents placed on the right and left sides. The right sided stone was very small. At the time of his RPG I noted some filling defects in the renal pelvis that appeared to most likely be clot.  On the left-hand side he had an impacted stone and I was able to get the stent past the stone. It had Hounsfield units of 800.  He said the stents have caused some discomfort in the flanks when he urinates and also at the termination of urination. He has also noting some increased urinary frequency. No fever or chills.   10/17/16: He underwent shockwave lithotripsy for the large left proximal ureteral calculi on 6/4. He has not had significant renal colic or fever or even other signs and symptoms of systemic infection. His pain is quite manageable on current medical therapy he has on hand. His biggest complaint continues to be bladder spasms, urgency, dysuria and incontinence. He has passed some small stone fragments. Denies gross hematuria. Denies fever.   Interval history 11/15/16: He follows up after having lithotripsy of his left ureteral stone but also follows up for his right ureteral stone as well. He has bilateral double-J stents in. He has indicated to me in the past that the stents are causing quite a bit of discomfort and that he can't work while they are in place. 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     CC: Elevated PSA - Established Patient  HPI: His last PSA was performed 10/13/2016. The last PSA value was 4.03.   12/12/16: On 10/13/16 he had a PSA obtained that was elevated at 4.03. This will need to be followed up as he had undergone urethral manipulation with stent placement prior to this being obtained.    ALLERGIES: Cipro TABS Sulfa    MEDICATIONS: Myrbetriq 50 mg tablet, extended release 24 hr 1 tablet PO Daily  Phenazopyridine Hcl 200 mg tablet 1 tablet PO Q 6 H  Trospium Chloride Er 60 mg capsule, ext release 24 hr 1 capsule PO Daily  Advil TABS Oral  Advil TABS Oral  Aspirin Ec 81 mg tablet, delayed release Oral  Atorvastatin Calcium 40 MG Oral Tablet Oral  Benicar 20 MG Oral Tablet Oral  Fish Oil CAPS Oral  Garlic TABS Oral  MetFORMIN HCl - 1000 MG Oral Tablet Oral  Niacin Flush Free 500 MG Oral Capsule Oral  Oxycodone-Acetaminophen 5 mg-325 mg tablet Oral  Tamsulosin Hcl 0.4 mg capsule, ext release 24 hr 1 Oral Daily     GU PSH: Cysto Uretero Lithotripsy - 2013 Cystoscopy Insert Stent, Bilateral - 10/05/2016, 2013 ESWL - 10/17/2016, 2013      PSH Notes: Cystoscopy With Ureteroscopy With Lithotripsy, Cystoscopy With Insertion Of Ureteral Stent Left, Lithotripsy,  No Surgical Problems   NON-GU PSH: None   GU PMH: Ureteral calculus (Stable), Bilateral, We discussed the management of urinary stones. These options include observation, ureteroscopy, shockwave lithotripsy, and PCNL. We discussed which options are relevant to these particular stones. We discussed the natural history of stones as well as the complications of untreated stones and the impact on quality of life without treatment as well as with each of the above listed treatments. We also discussed the efficacy of each treatment in its ability to clear the stone burden. With any of these management options I discussed the  signs and symptoms of infection and the need for emergent treatment should these be experienced. For each option we discussed the ability of each procedure to clear the patient of their stone burden. For observation I described the risks which include but are not limited to silent renal damage, life-threatening infection, need for emergent surgery, failure to pass stone, and pain. For ureteroscopy I described the risks which include heart attack, stroke, pulmonary embolus, death, bleeding, infection, damage to contiguous structures, positioning injury, ureteral stricture, ureteral avulsion, ureteral injury, need for ureteral stent, inability to perform ureteroscopy, need for an interval procedure, inability to clear stone burden, stent discomfort and pain. For shockwave lithotripsy I described the risks which include arrhythmia, kidney contusion, kidney hemorrhage, need for transfusion, long-term risk of diabetes or hypertension, back discomfort, flank ecchymosis, flank abrasion, inability to break up stone, inability to pass stone fragments, Steinstrasse, infection associated with obstructing stones, need for different surgical procedure and possible need for repeat shockwave lithotripsy. - 10/11/2016, Calculus of ureter, - 2012-09-27 Ureteral obstruction secondary to calculous, Left, He has a moderate amount of hydronephrosis on the left and there may be some thinning of the parenchyma indicating that this stone has likely been present for some time. In addition I had some difficulty getting the stent past the stone suggesting it may be somewhat impacted. - 10/11/2016 Dorsalgia, Unspec, Back pain, acute - 09/28/2014 Renal calculus, Nephrolithiasis - 28-Sep-2014 Urinary Tract Inf, Unspec site, Pyuria - 09/28/2014, Pyuria, - 2012-09-27 BPH w/o LUTS, Benign localized prostatic hyperplasia without lower urinary tract symptoms (LUTS) - Sep 27, 2013 Encounter for Prostate Cancer screening, Prostate cancer screening - 09/27/2013 History of urolithiasis,  History of nephrolithiasis - 2013/09/27 Elevated PSA, Elevated prostate specific antigen (PSA) - Sep 27, 2012 Other microscopic hematuria, Microscopic hematuria - 2014    NON-GU PMH: Encounter for general adult medical examination without abnormal findings, Encounter for preventive health examination - 09/27/2013 Personal history of other diseases of the circulatory system, History of hypertension - 09-27-2012 Personal history of other endocrine, nutritional and metabolic disease, History of hypercholesterolemia - 09/27/2012, History of diabetes mellitus, - Sep 27, 2012    FAMILY HISTORY: Death In The Family Father - Runs In Family Diabetes - Mother Heart Disease - Mother Hypertension - Mother nephrolithiasis - Father ovarian cancer - Mother Thyroid Disorder - Mother   SOCIAL HISTORY: Marital Status: Married Current Smoking Status: Patient has never smoked.   Tobacco Use Assessment Completed: Used Tobacco in last 30 days? Does not drink anymore.  Does not drink caffeine.     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  Gastrointestinal (Upper):   Patient denies nausea, vomiting, and indigestion/ heartburn.  Gastrointestinal (Lower):   Patient denies diarrhea and constipation.  Constitutional:   Patient denies fever, night sweats, weight loss, and fatigue.  Skin:   Patient denies skin rash/ lesion and itching.  Eyes:   Patient denies blurred vision and double vision.  Ears/ Nose/ Throat:   Patient denies sore throat and sinus problems.  Hematologic/Lymphatic:   Patient denies swollen glands and easy bruising.  Cardiovascular:   Patient denies leg swelling and  chest pains.  Respiratory:   Patient denies cough and shortness of breath.  Endocrine:   Patient denies excessive thirst.  Musculoskeletal:   Patient denies back pain and joint pain.  Neurological:   Patient denies headaches and dizziness.  Psychologic:   Patient denies depression and anxiety.   VITAL SIGNS:     Weight 190 lb / 86.18 kg  Height 65 in / 165.1 cm  BP 129/78 mmHg  Pulse 65 /min  BMI 31.6 kg/m   Physical Exam  Constitutional: Well nourished and well developed.   ENT:. The ears and nose are normal in appearance. The oropharynx is normal.   Neck: The appearance of the neck is normal and no neck mass is present.   Pulmonary: No respiratory distress and normal respiratory rhythm and effort.   Cardiovascular: Heart rate and rhythm are normal . No peripheral edema.   Abdomen: The abdomen is soft and nontender. The abdomen is no rebound. No CVA tenderness.   Lymphatics: The posterior cervical and supraclavicular nodes are not enlarged or tender.   Skin: Normal skin turgor and no visible rash.   Neuro/Psych:. Mood and affect are appropriate. No focal sensory deficits.    PAST DATA REVIEWED:  Source Of History:  Patient  Records Review:   Previous Patient Records, POC Tool  X-Ray Review: KUB: Reviewed Films. Previous KUB images were reviewed and compared with today's study.    10/13/16 09/27/12 03/22/12  PSA  Total PSA 4.03 ng/dl 1.61  0.96   Free PSA  0.44  0.45   % Free PSA  16  17     PROCEDURES:         KUB - 74018  A single view of the abdomen is obtained.        Urinalysis w/Scope Dipstick Dipstick Cont'd Micro  Color: Orange Bilirubin: Neg WBC/hpf: 6 - 10/hpf  Appearance: Cloudy Ketones: Neg RBC/hpf: 20 - 40/hpf  Specific Gravity: 1.025 Blood: 3+ Bacteria: Few (10-25/hpf)  pH: 5.5 Protein: 2+ Cystals: NS (Not Seen)  Glucose: Neg Urobilinogen: 1.0 Casts: NS (Not Seen)    Nitrites: Positive Trichomonas: Not Present    Leukocyte Esterase: 2+ Mucous:  Not Present      Epithelial Cells: 0 - 5/hpf      Yeast: NS (Not Seen)      Sperm: Not Present    ASSESSMENT/PLAN     He has bilateral ureteral calculi. We therefore discussed proceeding with ureteroscopy in order to clear him of all stones. I went over the procedure of bilateral stent removal, retrograde pyelogram, ureteroscopy with laser lithotripsy and probable stent replacement. We went over the procedure in detail. I discussed its risks and complications, the probability of success, the outpatient nature of the procedure as well as the anticipated postoperative course. He understands and is elected to proceed.  2   Elevated PSA - R97.20 Stable - He has an elevated PSA that does need follow-up but  right now with stents in place and possibly infected urine I need to hold off on that.

## 2016-11-25 NOTE — Transfer of Care (Signed)
Immediate Anesthesia Transfer of Care Note  Patient: Austin Gould  Procedure(s) Performed: Procedure(s): CYSTOSCOPY BILATERAL STENT REMOVAL  BILATERAL, URETEROSCOPY STONE REMOVAL  AND left  STENT REPLACEMENT with holmium laser (Bilateral) HOLMIUM LASER APPLICATION (Bilateral)  Patient Location: PACU  Anesthesia Type:General  Level of Consciousness: awake, alert , oriented and patient cooperative  Airway & Oxygen Therapy: Patient Spontanous Breathing and Patient connected to nasal cannula oxygen  Post-op Assessment: Report given to RN and Post -op Vital signs reviewed and stable  Post vital signs: Reviewed and stable  Last Vitals:  Vitals:   11/25/16 0727  BP: (!) 142/81  Pulse: 81  Resp: 16  Temp: 37.2 C    Last Pain:  Vitals:   11/25/16 0825  TempSrc:   PainSc: 2       Patients Stated Pain Goal: 8 (11/25/16 0825)  Complications: No apparent anesthesia complications

## 2016-11-25 NOTE — Discharge Instructions (Signed)
°Post Anesthesia Home Care Instructions ° °Activity: °Get plenty of rest for the remainder of the day. A responsible individual must stay with you for 24 hours following the procedure.  °For the next 24 hours, DO NOT: °-Drive a car °-Operate machinery °-Drink alcoholic beverages °-Take any medication unless instructed by your physician °-Make any legal decisions or sign important papers. ° °Meals: °Start with liquid foods such as gelatin or soup. Progress to regular foods as tolerated. Avoid greasy, spicy, heavy foods. If nausea and/or vomiting occur, drink only clear liquids until the nausea and/or vomiting subsides. Call your physician if vomiting continues. ° °Special Instructions/Symptoms: °Your throat may feel dry or sore from the anesthesia or the breathing tube placed in your throat during surgery. If this causes discomfort, gargle with warm salt water. The discomfort should disappear within 24 hours. ° °If you had a scopolamine patch placed behind your ear for the management of post- operative nausea and/or vomiting: ° °1. The medication in the patch is effective for 72 hours, after which it should be removed.  Wrap patch in a tissue and discard in the trash. Wash hands thoroughly with soap and water. °2. You may remove the patch earlier than 72 hours if you experience unpleasant side effects which may include dry mouth, dizziness or visual disturbances. °3. Avoid touching the patch. Wash your hands with soap and water after contact with the patch. °  °Post stone removal/stent placement surgery instructions ° ° °Definitions: ° °Ureter: The duct that transports urine from the kidney to the bladder. °Stent: A plastic hollow tube that is placed into the ureter, from the kidney to the bladder to prevent the ureter from swelling shut. ° °General instructions: ° °Despite the fact that no skin incisions were used, the area around the ureter and bladder is raw and irritated. The stent is a foreign body which will  further irritate the bladder wall. This irritation is manifested by increased frequency of urination, both day and night, and by an increase in the urge to urinate. In some, the urge to urinate is present almost always. Sometimes the urge is strong enough that you may not be able to stop your self from urinating. The only real cure is to remove the stent and then give time for the bladder wall to heal which can't be done until the danger of the ureter swelling shut has passed. (This varies from 2-21 days). ° °You may see some blood in your urine while the stent is in place and a few days afterward. Do not be alarmed, even if the urine is clear for a while. Get off your feet and drink lots of fluids until clearing occurs. If you start to pass clots or don't improve, call us. ° °If you have a string coming from your urethra:  The stent string is attached to your ureteral stent.  Do not pull on thisIf you have a string coming from your urethra:  The stent string is attached to your ureteral stent.  Do not pull on this. ° °Diet: ° °You may return to your normal diet immediately. Because of the raw surface of your bladder, alcohol, spicy foods, foods high in acid and drinks with caffeine may cause irritation or frequency and should be used in moderation. To keep your urine flowing freely and avoid constipation, drink plenty of fluids during the day (8-10 glasses). Tip: Avoid cranberry juice because it is very acidic. ° °Activity: ° °Your physical activity doesn't need to be   restricted. However, if you are very active, you may see some blood in the urine. We suggest that you reduce your activity under the circumstances until the bleeding has stopped. ° °Bowels: ° °It is important to keep your bowels regular during the postoperative period. Straining with bowel movements can cause bleeding. A bowel movement every other day is reasonable. Use a mild laxative if needed, such as milk of magnesia 2-3 tablespoons, or 2 Dulcolax  tablets. Call if you continue to have problems. If you had been taking narcotics for pain, before, during or after your surgery, you may be constipated. Take a laxative if necessary. ° ° ° ° °Medication: ° °You should resume your pre-surgery medications unless told not to. DO NOT RESUME YOUR ASPIRIN, or any other medicines like ibuprofen, motrin, excedrin, advil, aleve, vitamin E, fish oil as these can all cause bleeding x 7 days. In addition you may be given an antibiotic to prevent or treat infection. Antibiotics are not always necessary. All medication should be taken as prescribed until the bottles are finished unless you are having an unusual reaction to one of the drugs. ° °Problems you should report to us: ° °a. Fever greater than 101°F. °b. Heavy bleeding, or clots (see notes above about blood in urine). °c. Inability to urinate. °d. Drug reactions (hives, rash, nausea, vomiting, diarrhea). °e. Severe burning or pain with urination that is not improving. ° °Followup: ° °You will need a followup appointment to monitor your progress in most cases. Please call the office for this appointment when you get home if your appointment has not already been scheduled. Usually the first appointment will be about 5-14 days after your surgery and if you have a stent in place it will likely be removed at that time. ° ° °

## 2016-11-25 NOTE — Op Note (Signed)
PATIENT:  Austin Gould  PRE-OPERATIVE DIAGNOSIS:  1. Right ureteral calculus 2. Left ureteral calculus 3. Left renal calculi  POST-OPERATIVE DIAGNOSIS: Same  PROCEDURE:  1. Cystoscopy with removal of right double-J stent 2.  Right ureteroscopy, laser lithotripsy and stone extraction. 3. Left double-J stent removal 4. Left ureteroscopy and laser lithotripsy with stone extraction. 5. Left renal stone lithotripsy and extraction. 6. Left double-J stent stent placement.  SURGEON: Garnett Farm, MD  INDICATION: Mr. South is a 61 year old male with a history of bilateral ureteral calculi. He had bilateral double-J stents placed and underwent lithotripsy of a left proximal ureteral stone with only minimal breakup and passage of small fragments but there remained a large portion of stone present within the left proximal ureter. In addition he has a right proximal ureteral stone and left renal calculi. We have discussed surgical treatment and he presents to the operating room today for ureteroscopic management of his stones.  ANESTHESIA:  General  EBL:  Minimal  DRAINS: 6 French, 24 cm double-J stent in the left ureter (no string)  SPECIMEN: Stones given to patient  DISCRIPTION OF PROCEDURE: The patient was taken to the major OR and placed on the table. General anesthesia was administered and then the patient was moved to the dorsal lithotomy position. The genitalia was sterilely prepped and draped. An official timeout was performed.  Initially the 23 French cystoscope with 30 lens was passed under direct vision into the bladder. The bladder was then fully inspected. It was noted be free of any tumors, stones or inflammatory lesions. Stents were noted to be exiting both right and left ureteral orifices. I used the alligator forceps to grasp the stent exiting the right ureter and pulled it out through the urethral meatus. A 0.038 inch floppy tip sensor guidewire was then passed through the  stent and into the area the renal pelvis under fluoroscopy. This was left in place and I then placed a ureteral access sheath over the guidewire. Once in place I removed the inner portion of the access sheath and the guidewire and proceeded with ureteroscopy.  The 6 French flexible, digital ureteroscope was passed through the ureteral access sheath and up the ureter where the stone had migrated into the kidney. I was able to visualize the stone and grasped it with a [place] basket and placed in the upper pole where I then used a 200  holmium laser fiber to fragment the stone. I then used the [place] basket to extract all of the fragments and noted no further fragments remaining I therefore felt that because he had a stent indwelling for quite some time and there was no difficulty advancing the access sheath I elected not to place a stent on the right-hand side.  The cystoscope was reinserted into the bladder and I used alligator forceps to grasp the left ureteral stent and passed a guidewire up through that and into the area the renal pelvis removing the stent under fluoroscopy. I then passed the access sheath over the guidewire and again removed the inner portion and the guidewire. I then passed the flexible ureteroscope through the access sheath and noted that the stone in the proximal left ureter was associated with a great deal of edema and inflammation. It was partially fragmented but appeared to be partially impacted in the wall of the ureter. I therefore used the 200  holmium laser fiber to fragment the stone further up and was able to use the [place] basket intermittently to  extract fragments. This process took quite some time but I was able to get all the fragments from the proximal ureteral stone extracted. I then advanced my scope on into the kidney where I noted several other stones. Some were small enough that I was able to grasp with the [place] basket and extract without difficulty but there were  some larger stones that I fragmented into smaller portions and extracted those with the basket as well. I inspected all portions of the kidney after I felt I had fragmented and remove gallstones and found no further significant stone fragments present within the left kidney. I therefore passed a guidewire through the ureteroscope and left this in place and removed the ureteroscope and access sheath. I backloaded the cystoscope over the guidewire and passed the double-J stent over the guidewire and into the area the renal pelvis on the left-hand side. As a remove the guidewire good curl was noted in the renal pelvis and in the bladder. The bladder was then drained, the cystoscope removed and the patient was awakened and taken to the recovery room in stable and satisfactory condition. He tolerated the procedure well with no intraoperative complications.    PLAN OF CARE: Discharge to home after PACU  PATIENT DISPOSITION:  PACU - hemodynamically stable.

## 2016-11-25 NOTE — Anesthesia Postprocedure Evaluation (Signed)
Anesthesia Post Note  Patient: Halina Maidenserry L Wilkinson  Procedure(s) Performed: Procedure(s) (LRB): CYSTOSCOPY BILATERAL STENT REMOVAL  BILATERAL, URETEROSCOPY STONE REMOVAL  AND left  STENT REPLACEMENT with holmium laser (Bilateral) HOLMIUM LASER APPLICATION (Bilateral)     Patient location during evaluation: PACU Anesthesia Type: General Level of consciousness: awake and alert and oriented Pain management: pain level controlled Vital Signs Assessment: post-procedure vital signs reviewed and stable Respiratory status: spontaneous breathing, nonlabored ventilation and respiratory function stable Cardiovascular status: blood pressure returned to baseline and stable Postop Assessment: no signs of nausea or vomiting Anesthetic complications: no    Last Vitals:  Vitals:   11/25/16 1215 11/25/16 1230  BP: 140/80 (!) 150/82  Pulse: 90 84  Resp: 12 16  Temp:      Last Pain:  Vitals:   11/25/16 1215  TempSrc:   PainSc: 0-No pain                 Sharla Tankard A.

## 2016-11-29 ENCOUNTER — Encounter (HOSPITAL_BASED_OUTPATIENT_CLINIC_OR_DEPARTMENT_OTHER): Payer: Self-pay | Admitting: Urology

## 2016-12-23 ENCOUNTER — Inpatient Hospital Stay (HOSPITAL_COMMUNITY)
Admission: EM | Admit: 2016-12-23 | Discharge: 2016-12-28 | DRG: 871 | Disposition: A | Discharge status: Home Health | Payer: Managed Care, Other (non HMO) | Attending: Urology | Admitting: Urology

## 2016-12-23 ENCOUNTER — Emergency Department (HOSPITAL_COMMUNITY): Payer: Managed Care, Other (non HMO)

## 2016-12-23 ENCOUNTER — Inpatient Hospital Stay (HOSPITAL_COMMUNITY): Payer: Managed Care, Other (non HMO)

## 2016-12-23 ENCOUNTER — Inpatient Hospital Stay (HOSPITAL_COMMUNITY): Payer: Managed Care, Other (non HMO) | Admitting: Certified Registered Nurse Anesthetist

## 2016-12-23 ENCOUNTER — Encounter (HOSPITAL_COMMUNITY): Payer: Self-pay | Admitting: Emergency Medicine

## 2016-12-23 ENCOUNTER — Encounter (HOSPITAL_COMMUNITY)
Admission: EM | Disposition: A | Discharge status: Home Health | Payer: Self-pay | Source: Home / Self Care | Attending: Urology

## 2016-12-23 DIAGNOSIS — K219 Gastro-esophageal reflux disease without esophagitis: Secondary | ICD-10-CM | POA: Diagnosis present

## 2016-12-23 DIAGNOSIS — D649 Anemia, unspecified: Secondary | ICD-10-CM | POA: Diagnosis present

## 2016-12-23 DIAGNOSIS — Z9889 Other specified postprocedural states: Secondary | ICD-10-CM

## 2016-12-23 DIAGNOSIS — J9811 Atelectasis: Secondary | ICD-10-CM

## 2016-12-23 DIAGNOSIS — Z7984 Long term (current) use of oral hypoglycemic drugs: Secondary | ICD-10-CM | POA: Diagnosis not present

## 2016-12-23 DIAGNOSIS — N132 Hydronephrosis with renal and ureteral calculous obstruction: Secondary | ICD-10-CM

## 2016-12-23 DIAGNOSIS — B9620 Unspecified Escherichia coli [E. coli] as the cause of diseases classified elsewhere: Secondary | ICD-10-CM | POA: Diagnosis present

## 2016-12-23 DIAGNOSIS — R6521 Severe sepsis with septic shock: Secondary | ICD-10-CM | POA: Diagnosis present

## 2016-12-23 DIAGNOSIS — E785 Hyperlipidemia, unspecified: Secondary | ICD-10-CM | POA: Diagnosis present

## 2016-12-23 DIAGNOSIS — K429 Umbilical hernia without obstruction or gangrene: Secondary | ICD-10-CM | POA: Diagnosis present

## 2016-12-23 DIAGNOSIS — Z791 Long term (current) use of non-steroidal anti-inflammatories (NSAID): Secondary | ICD-10-CM

## 2016-12-23 DIAGNOSIS — E669 Obesity, unspecified: Secondary | ICD-10-CM | POA: Diagnosis present

## 2016-12-23 DIAGNOSIS — E2740 Unspecified adrenocortical insufficiency: Secondary | ICD-10-CM | POA: Diagnosis present

## 2016-12-23 DIAGNOSIS — Z79891 Long term (current) use of opiate analgesic: Secondary | ICD-10-CM

## 2016-12-23 DIAGNOSIS — R7881 Bacteremia: Secondary | ICD-10-CM | POA: Diagnosis not present

## 2016-12-23 DIAGNOSIS — Z7982 Long term (current) use of aspirin: Secondary | ICD-10-CM

## 2016-12-23 DIAGNOSIS — E1165 Type 2 diabetes mellitus with hyperglycemia: Secondary | ICD-10-CM | POA: Diagnosis present

## 2016-12-23 DIAGNOSIS — R000 Tachycardia, unspecified: Secondary | ICD-10-CM | POA: Diagnosis not present

## 2016-12-23 DIAGNOSIS — N179 Acute kidney failure, unspecified: Secondary | ICD-10-CM | POA: Diagnosis present

## 2016-12-23 DIAGNOSIS — I959 Hypotension, unspecified: Secondary | ICD-10-CM | POA: Diagnosis present

## 2016-12-23 DIAGNOSIS — A419 Sepsis, unspecified organism: Secondary | ICD-10-CM | POA: Diagnosis present

## 2016-12-23 DIAGNOSIS — Z881 Allergy status to other antibiotic agents status: Secondary | ICD-10-CM

## 2016-12-23 DIAGNOSIS — N400 Enlarged prostate without lower urinary tract symptoms: Secondary | ICD-10-CM | POA: Diagnosis present

## 2016-12-23 DIAGNOSIS — N3001 Acute cystitis with hematuria: Secondary | ICD-10-CM

## 2016-12-23 DIAGNOSIS — A4151 Sepsis due to Escherichia coli [E. coli]: Secondary | ICD-10-CM

## 2016-12-23 DIAGNOSIS — N136 Pyonephrosis: Secondary | ICD-10-CM | POA: Diagnosis present

## 2016-12-23 DIAGNOSIS — Z87891 Personal history of nicotine dependence: Secondary | ICD-10-CM

## 2016-12-23 DIAGNOSIS — Z87442 Personal history of urinary calculi: Secondary | ICD-10-CM

## 2016-12-23 DIAGNOSIS — Z6830 Body mass index (BMI) 30.0-30.9, adult: Secondary | ICD-10-CM

## 2016-12-23 DIAGNOSIS — J811 Chronic pulmonary edema: Secondary | ICD-10-CM | POA: Diagnosis present

## 2016-12-23 DIAGNOSIS — N200 Calculus of kidney: Secondary | ICD-10-CM

## 2016-12-23 DIAGNOSIS — Z79899 Other long term (current) drug therapy: Secondary | ICD-10-CM | POA: Diagnosis not present

## 2016-12-23 DIAGNOSIS — Z882 Allergy status to sulfonamides status: Secondary | ICD-10-CM | POA: Diagnosis not present

## 2016-12-23 DIAGNOSIS — I10 Essential (primary) hypertension: Secondary | ICD-10-CM | POA: Diagnosis present

## 2016-12-23 DIAGNOSIS — E119 Type 2 diabetes mellitus without complications: Secondary | ICD-10-CM

## 2016-12-23 HISTORY — PX: CYSTOSCOPY W/ URETERAL STENT PLACEMENT: SHX1429

## 2016-12-23 HISTORY — DX: Anemia, unspecified: D64.9

## 2016-12-23 LAB — BLOOD CULTURE ID PANEL (REFLEXED)
ACINETOBACTER BAUMANNII: NOT DETECTED
CANDIDA ALBICANS: NOT DETECTED
CANDIDA GLABRATA: NOT DETECTED
CANDIDA KRUSEI: NOT DETECTED
Candida parapsilosis: NOT DETECTED
Candida tropicalis: NOT DETECTED
Carbapenem resistance: NOT DETECTED
ENTEROBACTER CLOACAE COMPLEX: NOT DETECTED
ENTEROBACTERIACEAE SPECIES: DETECTED — AB
ESCHERICHIA COLI: DETECTED — AB
Enterococcus species: NOT DETECTED
Haemophilus influenzae: NOT DETECTED
KLEBSIELLA OXYTOCA: NOT DETECTED
Klebsiella pneumoniae: NOT DETECTED
Listeria monocytogenes: NOT DETECTED
Methicillin resistance: NOT DETECTED
NEISSERIA MENINGITIDIS: NOT DETECTED
PSEUDOMONAS AERUGINOSA: NOT DETECTED
Proteus species: NOT DETECTED
STAPHYLOCOCCUS SPECIES: NOT DETECTED
STREPTOCOCCUS AGALACTIAE: NOT DETECTED
STREPTOCOCCUS SPECIES: NOT DETECTED
Serratia marcescens: NOT DETECTED
Staphylococcus aureus (BCID): NOT DETECTED
Streptococcus pneumoniae: NOT DETECTED
Streptococcus pyogenes: NOT DETECTED
Vancomycin resistance: NOT DETECTED

## 2016-12-23 LAB — CBC
HCT: 33.5 % — ABNORMAL LOW (ref 39.0–52.0)
HEMATOCRIT: 26.5 % — AB (ref 39.0–52.0)
HEMOGLOBIN: 10.8 g/dL — AB (ref 13.0–17.0)
HEMOGLOBIN: 8.9 g/dL — AB (ref 13.0–17.0)
MCH: 26.5 pg (ref 26.0–34.0)
MCH: 27.8 pg (ref 26.0–34.0)
MCHC: 32.2 g/dL (ref 30.0–36.0)
MCHC: 33.6 g/dL (ref 30.0–36.0)
MCV: 82.3 fL (ref 78.0–100.0)
MCV: 82.8 fL (ref 78.0–100.0)
PLATELETS: 291 10*3/uL (ref 150–400)
Platelets: 216 10*3/uL (ref 150–400)
RBC: 4.07 MIL/uL — AB (ref 4.22–5.81)
RBC: 3.2 MIL/uL — AB (ref 4.22–5.81)
RDW: 16.2 % — ABNORMAL HIGH (ref 11.5–15.5)
RDW: 16.4 % — ABNORMAL HIGH (ref 11.5–15.5)
WBC: 13.9 10*3/uL — AB (ref 4.0–10.5)
WBC: 9.9 10*3/uL (ref 4.0–10.5)

## 2016-12-23 LAB — HEPATIC FUNCTION PANEL
ALT: 17 U/L (ref 17–63)
AST: 18 U/L (ref 15–41)
Albumin: 4.3 g/dL (ref 3.5–5.0)
Alkaline Phosphatase: 43 U/L (ref 38–126)
BILIRUBIN TOTAL: 0.5 mg/dL (ref 0.3–1.2)
Bilirubin, Direct: <0.1 mg/dL — ABNORMAL LOW (ref 0.1–0.5)
Total Protein: 7.2 g/dL (ref 6.5–8.1)

## 2016-12-23 LAB — URINALYSIS, ROUTINE W REFLEX MICROSCOPIC
BILIRUBIN URINE: NEGATIVE
Glucose, UA: NEGATIVE mg/dL
KETONES UR: 5 mg/dL — AB
Nitrite: POSITIVE — AB
PH: 5 (ref 5.0–8.0)
Protein, ur: 30 mg/dL — AB
Specific Gravity, Urine: 1.015 (ref 1.005–1.030)

## 2016-12-23 LAB — BASIC METABOLIC PANEL
ANION GAP: 11 (ref 5–15)
Anion gap: 9 (ref 5–15)
BUN: 28 mg/dL — ABNORMAL HIGH (ref 6–20)
BUN: 26 mg/dL — AB (ref 6–20)
CALCIUM: 9.1 mg/dL (ref 8.9–10.3)
CHLORIDE: 111 mmol/L (ref 101–111)
CO2: 22 mmol/L (ref 22–32)
CO2: 18 mmol/L — AB (ref 22–32)
CREATININE: 2.78 mg/dL — AB (ref 0.61–1.24)
Calcium: 7.1 mg/dL — ABNORMAL LOW (ref 8.9–10.3)
Chloride: 102 mmol/L (ref 101–111)
Creatinine, Ser: 2.43 mg/dL — ABNORMAL HIGH (ref 0.61–1.24)
GFR calc Af Amer: 32 mL/min — ABNORMAL LOW (ref >60)
GFR calc non Af Amer: 27 mL/min — ABNORMAL LOW (ref >60)
GFR, EST AFRICAN AMERICAN: 27 mL/min — AB (ref >60)
GFR, EST NON AFRICAN AMERICAN: 23 mL/min — AB (ref >60)
Glucose, Bld: 186 mg/dL — ABNORMAL HIGH (ref 65–99)
Glucose, Bld: 195 mg/dL — ABNORMAL HIGH (ref 65–99)
POTASSIUM: 4.4 mmol/L (ref 3.5–5.1)
Potassium: 3.9 mmol/L (ref 3.5–5.1)
SODIUM: 135 mmol/L (ref 135–145)
SODIUM: 138 mmol/L (ref 135–145)

## 2016-12-23 LAB — LACTIC ACID, PLASMA: Lactic Acid, Venous: 2.4 mmol/L (ref 0.5–1.9)

## 2016-12-23 LAB — DIFFERENTIAL
BASOS ABS: 0 10*3/uL (ref 0.0–0.1)
Basophils Relative: 0 %
EOS PCT: 0 %
Eosinophils Absolute: 0 10*3/uL (ref 0.0–0.7)
LYMPHS PCT: 4 %
Lymphs Abs: 0.5 10*3/uL — ABNORMAL LOW (ref 0.7–4.0)
Monocytes Absolute: 1.5 10*3/uL — ABNORMAL HIGH (ref 0.1–1.0)
Monocytes Relative: 12 %
NEUTROS ABS: 11.1 10*3/uL — AB (ref 1.7–7.7)
Neutrophils Relative %: 84 %

## 2016-12-23 LAB — I-STAT CG4 LACTIC ACID, ED: LACTIC ACID, VENOUS: 1.2 mmol/L (ref 0.5–1.9)

## 2016-12-23 LAB — GLUCOSE, CAPILLARY
Glucose-Capillary: 136 mg/dL — ABNORMAL HIGH (ref 65–99)
Glucose-Capillary: 178 mg/dL — ABNORMAL HIGH (ref 65–99)
Glucose-Capillary: 210 mg/dL — ABNORMAL HIGH (ref 65–99)
Glucose-Capillary: 279 mg/dL — ABNORMAL HIGH (ref 65–99)
Glucose-Capillary: 341 mg/dL — ABNORMAL HIGH (ref 65–99)

## 2016-12-23 LAB — TROPONIN I

## 2016-12-23 LAB — CORTISOL: Cortisol, Plasma: 8.6 ug/dL

## 2016-12-23 LAB — MRSA PCR SCREENING: MRSA by PCR: NEGATIVE

## 2016-12-23 SURGERY — CYSTOSCOPY, WITH RETROGRADE PYELOGRAM AND URETERAL STENT INSERTION
Anesthesia: General | Laterality: Bilateral

## 2016-12-23 MED ORDER — PROPOFOL 10 MG/ML IV BOLUS
INTRAVENOUS | Status: AC
  Filled 2016-12-23: qty 20

## 2016-12-23 MED ORDER — INSULIN ASPART 100 UNIT/ML ~~LOC~~ SOLN
0.0000 [IU] | Freq: Every day | SUBCUTANEOUS | Status: DC
  Administered 2016-12-23: 4 [IU] via SUBCUTANEOUS
  Administered 2016-12-27: 2 [IU] via SUBCUTANEOUS

## 2016-12-23 MED ORDER — ACETAMINOPHEN 325 MG PO TABS
650.0000 mg | ORAL_TABLET | Freq: Once | ORAL | Status: AC | PRN
  Administered 2016-12-23: 650 mg via ORAL

## 2016-12-23 MED ORDER — FENTANYL CITRATE (PF) 100 MCG/2ML IJ SOLN
INTRAMUSCULAR | Status: AC
  Filled 2016-12-23: qty 2

## 2016-12-23 MED ORDER — SODIUM CHLORIDE 0.9 % IV SOLN
INTRAVENOUS | Status: DC | PRN
  Administered 2016-12-23: 09:00:00 via INTRAVENOUS

## 2016-12-23 MED ORDER — PANTOPRAZOLE SODIUM 40 MG PO TBEC
40.0000 mg | DELAYED_RELEASE_TABLET | Freq: Every day | ORAL | Status: DC
  Administered 2016-12-23 – 2016-12-28 (×6): 40 mg via ORAL
  Filled 2016-12-23 (×6): qty 1

## 2016-12-23 MED ORDER — INSULIN ASPART 100 UNIT/ML ~~LOC~~ SOLN
0.0000 [IU] | Freq: Three times a day (TID) | SUBCUTANEOUS | Status: DC
  Administered 2016-12-23: 8 [IU] via SUBCUTANEOUS
  Administered 2016-12-24 (×2): 5 [IU] via SUBCUTANEOUS
  Administered 2016-12-24: 2 [IU] via SUBCUTANEOUS
  Administered 2016-12-25: 3 [IU] via SUBCUTANEOUS
  Administered 2016-12-25: 2 [IU] via SUBCUTANEOUS
  Administered 2016-12-25 – 2016-12-26 (×2): 3 [IU] via SUBCUTANEOUS
  Administered 2016-12-26: 2 [IU] via SUBCUTANEOUS
  Administered 2016-12-26 – 2016-12-27 (×3): 3 [IU] via SUBCUTANEOUS
  Administered 2016-12-27 – 2016-12-28 (×2): 5 [IU] via SUBCUTANEOUS

## 2016-12-23 MED ORDER — PIPERACILLIN-TAZOBACTAM 3.375 G IVPB: 3.3750 g | Freq: Three times a day (TID) | INTRAVENOUS | Status: DC

## 2016-12-23 MED ORDER — SODIUM CHLORIDE 0.9 % IV BOLUS (SEPSIS)
1000.0000 mL | Freq: Once | INTRAVENOUS | Status: AC
  Administered 2016-12-23: 1000 mL via INTRAVENOUS

## 2016-12-23 MED ORDER — ONDANSETRON 4 MG PO TBDP
4.0000 mg | ORAL_TABLET | Freq: Once | ORAL | Status: AC
  Administered 2016-12-23: 4 mg via ORAL

## 2016-12-23 MED ORDER — LIDOCAINE HCL (CARDIAC) 20 MG/ML IV SOLN
INTRAVENOUS | Status: DC | PRN
  Administered 2016-12-23: 50 mg via INTRAVENOUS

## 2016-12-23 MED ORDER — FENTANYL CITRATE (PF) 100 MCG/2ML IJ SOLN
INTRAMUSCULAR | Status: DC | PRN
  Administered 2016-12-23: 25 ug via INTRAVENOUS

## 2016-12-23 MED ORDER — INSULIN ASPART 100 UNIT/ML ~~LOC~~ SOLN
SUBCUTANEOUS | Status: AC
  Filled 2016-12-23: qty 1

## 2016-12-23 MED ORDER — ATORVASTATIN CALCIUM 40 MG PO TABS
40.0000 mg | ORAL_TABLET | Freq: Every day | ORAL | Status: DC
  Administered 2016-12-23 – 2016-12-27 (×5): 40 mg via ORAL
  Filled 2016-12-23 (×5): qty 1

## 2016-12-23 MED ORDER — BELLADONNA-OPIUM 16.2-30 MG RE SUPP
1.0000 | Freq: Four times a day (QID) | RECTAL | Status: DC | PRN
  Administered 2016-12-23: 1 via RECTAL

## 2016-12-23 MED ORDER — SODIUM CHLORIDE 0.9 % IV SOLN
INTRAVENOUS | Status: DC | PRN
  Administered 2016-12-23: 1000 mg via INTRAVENOUS

## 2016-12-23 MED ORDER — PHENYLEPHRINE HCL 10 MG/ML IJ SOLN
INTRAVENOUS | Status: DC | PRN
  Administered 2016-12-23: 100 ug/min via INTRAVENOUS

## 2016-12-23 MED ORDER — DEXTROSE 5 % IV SOLN
2.0000 g | INTRAVENOUS | Status: DC
  Administered 2016-12-24: 2 g via INTRAVENOUS
  Filled 2016-12-23: qty 2

## 2016-12-23 MED ORDER — IOHEXOL 300 MG/ML  SOLN
INTRAMUSCULAR | Status: DC | PRN
  Administered 2016-12-23: 20 mL via URETHRAL

## 2016-12-23 MED ORDER — VANCOMYCIN HCL IN DEXTROSE 1-5 GM/200ML-% IV SOLN
INTRAVENOUS | Status: AC
  Filled 2016-12-23: qty 200

## 2016-12-23 MED ORDER — PROMETHAZINE HCL 25 MG/ML IJ SOLN: 6.2500 mg | INTRAMUSCULAR | Status: DC | PRN

## 2016-12-23 MED ORDER — ZOLPIDEM TARTRATE 5 MG PO TABS
5.0000 mg | ORAL_TABLET | Freq: Every evening | ORAL | Status: DC | PRN
  Administered 2016-12-25: 5 mg via ORAL
  Filled 2016-12-23: qty 1

## 2016-12-23 MED ORDER — MIDAZOLAM HCL 2 MG/2ML IJ SOLN
INTRAMUSCULAR | Status: AC
  Filled 2016-12-23: qty 2

## 2016-12-23 MED ORDER — OXYCODONE HCL 5 MG PO TABS
ORAL_TABLET | ORAL | Status: AC
  Administered 2016-12-23: 5 mg via ORAL
  Filled 2016-12-23: qty 1

## 2016-12-23 MED ORDER — BELLADONNA-OPIUM 16.2-30 MG RE SUPP
RECTAL | Status: AC
  Filled 2016-12-23: qty 1

## 2016-12-23 MED ORDER — PIPERACILLIN-TAZOBACTAM 3.375 G IVPB 30 MIN
3.3750 g | Freq: Once | INTRAVENOUS | Status: AC
  Administered 2016-12-23: 3.375 g via INTRAVENOUS

## 2016-12-23 MED ORDER — ONDANSETRON HCL 4 MG/2ML IJ SOLN
INTRAMUSCULAR | Status: AC
  Filled 2016-12-23: qty 2

## 2016-12-23 MED ORDER — ONDANSETRON HCL 4 MG/2ML IJ SOLN: 4.0000 mg | INTRAMUSCULAR | Status: DC | PRN

## 2016-12-23 MED ORDER — ONDANSETRON HCL 4 MG/2ML IJ SOLN
INTRAMUSCULAR | Status: DC | PRN
  Administered 2016-12-23: 4 mg via INTRAVENOUS

## 2016-12-23 MED ORDER — PHENYLEPHRINE HCL-NACL 10-0.9 MG/250ML-% IV SOLN
0.0000 ug/min | INTRAVENOUS | Status: DC
  Administered 2016-12-23: 25 ug/min via INTRAVENOUS
  Administered 2016-12-23: 33.3 ug/min via INTRAVENOUS

## 2016-12-23 MED ORDER — ACETAMINOPHEN 10 MG/ML IV SOLN
INTRAVENOUS | Status: AC
  Administered 2016-12-23: 1000 mg via INTRAVENOUS
  Filled 2016-12-23: qty 100

## 2016-12-23 MED ORDER — PIPERACILLIN-TAZOBACTAM 3.375 G IVPB
INTRAVENOUS | Status: AC
  Filled 2016-12-23: qty 50

## 2016-12-23 MED ORDER — TAMSULOSIN HCL 0.4 MG PO CAPS
0.4000 mg | ORAL_CAPSULE | Freq: Every day | ORAL | Status: DC
Start: 2016-12-23 — End: 2016-12-28
  Administered 2016-12-23 – 2016-12-28 (×6): 0.4 mg via ORAL
  Filled 2016-12-23 (×6): qty 1

## 2016-12-23 MED ORDER — FENTANYL CITRATE (PF) 100 MCG/2ML IJ SOLN: 25.0000 ug | INTRAMUSCULAR | Status: DC | PRN

## 2016-12-23 MED ORDER — METOCLOPRAMIDE HCL 5 MG/ML IJ SOLN
INTRAMUSCULAR | Status: DC | PRN
  Administered 2016-12-23: 10 mg via INTRAVENOUS

## 2016-12-23 MED ORDER — VANCOMYCIN HCL 10 G IV SOLR
1250.0000 mg | INTRAVENOUS | Status: DC
  Filled 2016-12-23: qty 1250

## 2016-12-23 MED ORDER — MORPHINE SULFATE (PF) 4 MG/ML IV SOLN
4.0000 mg | Freq: Once | INTRAVENOUS | Status: AC
Start: 2016-12-23 — End: 2016-12-23
  Administered 2016-12-23: 4 mg via INTRAVENOUS
  Filled 2016-12-23: qty 1

## 2016-12-23 MED ORDER — 0.9 % SODIUM CHLORIDE (POUR BTL) OPTIME
TOPICAL | Status: DC | PRN
  Administered 2016-12-23: 1000 mL

## 2016-12-23 MED ORDER — LACTATED RINGERS IV BOLUS (SEPSIS)
1000.0000 mL | Freq: Once | INTRAVENOUS | Status: AC
  Administered 2016-12-23: 1000 mL via INTRAVENOUS

## 2016-12-23 MED ORDER — SUCCINYLCHOLINE CHLORIDE 200 MG/10ML IV SOSY
PREFILLED_SYRINGE | INTRAVENOUS | Status: AC
  Filled 2016-12-23: qty 10

## 2016-12-23 MED ORDER — PHENYLEPHRINE HCL-NACL 10-0.9 MG/250ML-% IV SOLN
INTRAVENOUS | Status: AC
  Administered 2016-12-23: 33.3 ug/min via INTRAVENOUS
  Filled 2016-12-23: qty 250

## 2016-12-23 MED ORDER — PROPOFOL 10 MG/ML IV BOLUS
INTRAVENOUS | Status: DC | PRN
  Administered 2016-12-23: 30 mg via INTRAVENOUS
  Administered 2016-12-23: 200 mg via INTRAVENOUS

## 2016-12-23 MED ORDER — METOCLOPRAMIDE HCL 5 MG/ML IJ SOLN
INTRAMUSCULAR | Status: AC
  Filled 2016-12-23: qty 2

## 2016-12-23 MED ORDER — HYDROMORPHONE HCL-NACL 0.5-0.9 MG/ML-% IV SOSY
0.5000 mg | PREFILLED_SYRINGE | INTRAVENOUS | Status: DC | PRN
  Administered 2016-12-24 – 2016-12-27 (×3): 1 mg via INTRAVENOUS
  Filled 2016-12-23 (×3): qty 2

## 2016-12-23 MED ORDER — DEXTROSE 5 % IV SOLN: 1.0000 g | INTRAVENOUS | Status: DC

## 2016-12-23 MED ORDER — ACETAMINOPHEN 10 MG/ML IV SOLN
1000.0000 mg | Freq: Once | INTRAVENOUS | Status: AC
  Administered 2016-12-23: 1000 mg via INTRAVENOUS

## 2016-12-23 MED ORDER — FENTANYL CITRATE (PF) 100 MCG/2ML IJ SOLN
50.0000 ug | Freq: Once | INTRAMUSCULAR | Status: AC
  Administered 2016-12-23: 50 ug via INTRAVENOUS
  Filled 2016-12-23: qty 2

## 2016-12-23 MED ORDER — PHENYLEPHRINE HCL 10 MG/ML IJ SOLN
INTRAMUSCULAR | Status: DC | PRN
  Administered 2016-12-23: 80 ug via INTRAVENOUS
  Administered 2016-12-23: 120 ug via INTRAVENOUS
  Administered 2016-12-23: 80 ug via INTRAVENOUS

## 2016-12-23 MED ORDER — SODIUM CHLORIDE 0.9 % IV SOLN
INTRAVENOUS | Status: DC
  Administered 2016-12-23: 11:00:00 via INTRAVENOUS

## 2016-12-23 MED ORDER — SODIUM CHLORIDE 0.9 % IR SOLN
Status: DC | PRN
  Administered 2016-12-23: 3000 mL via INTRAVESICAL

## 2016-12-23 MED ORDER — SODIUM CHLORIDE 0.9 % IV SOLN
INTRAVENOUS | Status: DC
  Administered 2016-12-23: 08:00:00 via INTRAVENOUS
  Administered 2016-12-23: 125 mL/h via INTRAVENOUS

## 2016-12-23 MED ORDER — OXYCODONE HCL 5 MG PO TABS
5.0000 mg | ORAL_TABLET | ORAL | Status: DC | PRN
  Administered 2016-12-23 – 2016-12-26 (×6): 5 mg via ORAL
  Filled 2016-12-23 (×5): qty 1

## 2016-12-23 MED ORDER — ONDANSETRON 4 MG PO TBDP
ORAL_TABLET | ORAL | Status: AC
  Filled 2016-12-23: qty 1

## 2016-12-23 MED ORDER — DEXAMETHASONE SODIUM PHOSPHATE 4 MG/ML IJ SOLN
INTRAMUSCULAR | Status: DC | PRN
  Administered 2016-12-23: 10 mg via INTRAVENOUS

## 2016-12-23 MED ORDER — DEXTROSE 5 % IV SOLN
1.0000 g | INTRAVENOUS | Status: DC
  Filled 2016-12-23: qty 10

## 2016-12-23 MED ORDER — LACTATED RINGERS IV SOLN
INTRAVENOUS | Status: DC
  Administered 2016-12-23 – 2016-12-27 (×8): via INTRAVENOUS

## 2016-12-23 MED ORDER — SACCHAROMYCES BOULARDII 250 MG PO CAPS
500.0000 mg | ORAL_CAPSULE | Freq: Two times a day (BID) | ORAL | Status: DC
  Administered 2016-12-23 – 2016-12-28 (×10): 500 mg via ORAL
  Filled 2016-12-23 (×10): qty 2

## 2016-12-23 MED ORDER — PHENYLEPHRINE HCL-NACL 10-0.9 MG/250ML-% IV SOLN
INTRAVENOUS | Status: AC
  Administered 2016-12-23: 25 ug/min via INTRAVENOUS
  Filled 2016-12-23: qty 250

## 2016-12-23 MED ORDER — DEXAMETHASONE SODIUM PHOSPHATE 10 MG/ML IJ SOLN
INTRAMUSCULAR | Status: AC
Start: 2016-12-23 — End: 2016-12-23
  Filled 2016-12-23: qty 1

## 2016-12-23 MED ORDER — ACETAMINOPHEN 325 MG PO TABS
ORAL_TABLET | ORAL | Status: AC
  Filled 2016-12-23: qty 2

## 2016-12-23 MED ORDER — LIDOCAINE 2% (20 MG/ML) 5 ML SYRINGE
INTRAMUSCULAR | Status: AC
  Filled 2016-12-23: qty 5

## 2016-12-23 MED ORDER — DEXTROSE 5 % IV SOLN
1.0000 g | Freq: Once | INTRAVENOUS | Status: AC
  Administered 2016-12-23: 1 g via INTRAVENOUS
  Filled 2016-12-23: qty 10

## 2016-12-23 MED ORDER — MIDAZOLAM HCL 5 MG/5ML IJ SOLN
INTRAMUSCULAR | Status: DC | PRN
  Administered 2016-12-23: 2 mg via INTRAVENOUS

## 2016-12-23 MED ORDER — ASPIRIN EC 81 MG PO TBEC
81.0000 mg | DELAYED_RELEASE_TABLET | Freq: Every day | ORAL | Status: DC
  Administered 2016-12-23 – 2016-12-28 (×6): 81 mg via ORAL
  Filled 2016-12-23 (×6): qty 1

## 2016-12-23 SURGICAL SUPPLY — 31 items
BAG URINE DRAINAGE (UROLOGICAL SUPPLIES) ×3 IMPLANT
BAG URO CATCHER STRL LF (MISCELLANEOUS) ×3 IMPLANT
BASKET DAKOTA 1.9FR 11X120 (BASKET) IMPLANT
BASKET LASER NITINOL 1.9FR (BASKET) IMPLANT
CATH FOLEY 2WAY SLVR  5CC 18FR (CATHETERS) ×2
CATH FOLEY 2WAY SLVR 5CC 18FR (CATHETERS) ×1 IMPLANT
CATH FOLEY LATEX FREE 20FR (CATHETERS)
CATH FOLEY LF 20FR (CATHETERS) IMPLANT
CATH INTERMIT  6FR 70CM (CATHETERS) ×3 IMPLANT
CLOTH BEACON ORANGE TIMEOUT ST (SAFETY) ×3 IMPLANT
COVER SURGICAL LIGHT HANDLE (MISCELLANEOUS) ×3 IMPLANT
EXTRACTOR STONE NITINOL NGAGE (UROLOGICAL SUPPLIES) IMPLANT
FIBER LASER TRAC TIP (UROLOGICAL SUPPLIES) IMPLANT
GLOVE BIO SURGEON STRL SZ8 (GLOVE) ×3 IMPLANT
GOWN STRL REUS W/TWL XL LVL3 (GOWN DISPOSABLE) ×3 IMPLANT
GUIDEWIRE ANG ZIPWIRE 038X150 (WIRE) IMPLANT
GUIDEWIRE STR DUAL SENSOR (WIRE) ×3 IMPLANT
HOLDER FOLEY CATH W/STRAP (MISCELLANEOUS) ×3 IMPLANT
IV NS 1000ML (IV SOLUTION)
IV NS 1000ML BAXH (IV SOLUTION) IMPLANT
MANIFOLD NEPTUNE II (INSTRUMENTS) ×3 IMPLANT
PACK CYSTO (CUSTOM PROCEDURE TRAY) ×3 IMPLANT
SHEATH ACCESS URETERAL 38CM (SHEATH) IMPLANT
STENT CONTOUR 6FRX26X.038 (STENTS) ×6 IMPLANT
SYR CONTROL 10ML LL (SYRINGE) IMPLANT
SYRINGE 10CC LL (SYRINGE) ×3 IMPLANT
SYRINGE IRR TOOMEY STRL 70CC (SYRINGE) IMPLANT
TUBE FEEDING 8FR 16IN STR KANG (MISCELLANEOUS) IMPLANT
TUBING CONNECTING 10 (TUBING) ×2 IMPLANT
TUBING CONNECTING 10' (TUBING) ×1
WATER STERILE IRR 500ML POUR (IV SOLUTION) ×3 IMPLANT

## 2016-12-23 NOTE — Progress Notes (Signed)
Pharmacy Antibiotic Note  Austin Gould is a 61 y.o. male admitted on 12/23/2016 with sepsis from urinary source.  Zosyn and Vancomycin ordered.  Pharmacy has been consulted for vancomycin dosing.  Patient has history of nephrolithiasis. Today's CT showed right kidney stone, left hydronephrosis. To OR for stent placement, received Ceftriaxone 1gm x1 (0500), Zosyn x1 (0900), Vancomycin ordered, none received. Abx changed to Rocephin, pharmacy to dose  Will sign off Rocephin protocol, no dose adjustment needed for any renal dysfunction  Plan: Rocephin 1gm q24  Height: 5\' 5"  (165.1 cm) Weight: 185 lb (83.9 kg) IBW/kg (Calculated) : 61.5  Temp (24hrs), Avg:100.4 F (38 C), Min:98.4 F (36.9 C), Max:103.1 F (39.5 C)   Recent Labs Lab 12/23/16 0108 12/23/16 0412 12/23/16 1006  WBC 13.9*  --  9.9  CREATININE 2.78*  --  2.43*  LATICACIDVEN  --  1.20  --     Estimated Creatinine Clearance: 32.6 mL/min (A) (by C-G formula based on SCr of 2.43 mg/dL (H)).    Allergies  Allergen Reactions  . Ciprofloxacin Hives  . Sulfamethoxazole-Trimethoprim Hives    Antimicrobials this admission: 8/10 Ceftriaxone x 1 preop & continued >> 8/10 Vanc >> 8/10 8/10 Zosyn >> 8/10  Microbiology results: 8/10 BCx: pending 8/10 UCx: pending  Thank you for allowing pharmacy to be a part of this patient's care.  Otho BellowsGreen, Rolly Magri L 12/23/2016 3:43 PM

## 2016-12-23 NOTE — Op Note (Signed)
Preoperative diagnosis: right ureteral calculi, left hydronephrosis, sepsis  Postoperative diagnosis: Same  Procedure: 1 cystoscopy 2. bilateralretrograde pyelography 3.  Intraoperative fluoroscopy, under one hour, with interpretation 4.  bilateral 6 x 26 JJ stent placement  Attending: Cleda MccreedyPatrick Mackenzie  Anesthesia: General  Estimated blood loss: None  Drains: bilateral 6 x 26 JJ ureteral stent without tether  Specimens: none  Antibiotics:  Vanc and Zosyn  Findings: right ureteral stones with moderate hydroneprhosis. moderate left hydronephrosis to the UVJ. No masses/lesions in the bladder. Ureteral orifices in normal anatomic location.  Indications: Patient is a 61 year old male with a history of right ureteral stone, left hydronephrosis, sepsis and elevated creatinine. After discussing treatment options, they decided proceed with bilateral stent placement.  Procedure her in detail: The patient was brought to the operating room and a brief timeout was done to ensure correct patient, correct procedure, correct site.  General anesthesia was administered patient was placed in dorsal lithotomy position.  Her genitalia was then prepped and draped in usual sterile fashion.  A rigid 22 French cystoscope was passed in the urethra and the bladder.  Bladder was inspected free masses or lesions.  the ureteral orifices were in the normal orthotopic locations. a 6 french ureteral catheter was then instilled into the left ureteral orifice.  a gentle retrograde was obtained and findings noted above. We then advanced a zipwire up to the renal pelvis. We then placed a 6 x 26 double-j ureteral stent over the zip wire. We then removed the wire and good coil was noted in the the renal pelvis under fluoroscopy and the bladder under direct vision.  We then turned out attention to the right side.  a gentle retrograde was obtained and findings noted above. We then advanced a zipwire up to the renal pelvis. we then  placed a 6 x 26 double-j ureteral stent over the original zip wire.  We then removed the wire and good coil was noted in the the renal pelvis under fluoroscopy and the bladder under direct vision.  the bladder was then drained and this concluded the procedure which was well tolerated by patient.  Complications: None  Condition: Stable, extubated, transferred to PACU  Plan: Patient is to be admitted for IV antibiotics.

## 2016-12-23 NOTE — Anesthesia Postprocedure Evaluation (Signed)
Anesthesia Post Note  Patient: Austin Gould  Procedure(s) Performed: Procedure(s) (LRB): CYSTOSCOPY WITH RETROGRADE PYELOGRAM/URETERAL BILATERAL STENT PLACEMENT (Bilateral)     Patient location during evaluation: PACU Anesthesia Type: General Level of consciousness: awake and alert Pain management: pain level controlled Respiratory status: spontaneous breathing, nonlabored ventilation, respiratory function stable and patient connected to nasal cannula oxygen Cardiovascular status: stable Postop Assessment: no signs of nausea or vomiting Anesthetic complications: no Comments: Patient requiring low dose phenylephrine infusion to maintain adequate MAPs. Patient heading to step-down unit following PACU for further weaning of vasopressor.    Last Vitals:  Vitals:   12/23/16 1015 12/23/16 1030  BP: (!) 92/59 96/64  Pulse: (!) 118 (!) 112  Resp: (!) 23 (!) 23  Temp: (!) 39.2 C   SpO2: 97% 96%    Last Pain:  Vitals:   12/23/16 1030  TempSrc:   PainSc: Asleep                 Beryle Lathehomas E Graeme Menees

## 2016-12-23 NOTE — ED Triage Notes (Signed)
Pt from home with c/o left flank pain, fever, and emesis. Pt states he began having kidney stones about 10 weeks ago and has subsequent kidney infections ever since. Pt states this feels similar to his previous kidney infection. Pt states he had 2 episodes of emesis earlier last evening. Pt states he has only taken advil, which he tool around 1830.

## 2016-12-23 NOTE — H&P (Signed)
Urology Admission H&P  Chief Complaint: bilateral flank pain, fever  History of Present Illness: Austin Gould is a 61yo with a hx of nephrolithiasis who presented to the [facility] this morning with bilateral flank pain and fever of 101. The pain is sharp, constant, severe, nonradiating and bilateral.  He is s/p bilateral URS on 7/134 with stent removal in the office 1 week later. He was doing well until yesterday when he developed chills and a fever of 101. He has associated nausea/vomiting. In the ER he had a WBC count of 13.9 and temperature of 101. Ct scan showed a 6mm right proximal ureteral calculus and left hydronephrosis to the UVJ.   Past Medical History:  Diagnosis Date  . Bilateral ureteral calculi 10/04/2016  . Diabetes mellitus    type 2  . GERD (gastroesophageal reflux disease)    PRN MEDS as need  . History of kidney stones 10/04/2016   bil. per CT  . Hyperlipidemia   . Hypertension   . Umbilical hernia    Past Surgical History:  Procedure Laterality Date  . CYSTOSCOPY W/ URETERAL STENT PLACEMENT Bilateral 10/04/2016   Procedure: CYSTOSCOPY WITH RETROGRADE PYELOGRAM/BILATERAL URETERAL STENT PLACEMENT;  Surgeon: Ihor Gully, MD;  Location: [facility];  Service: Urology;  Laterality: Bilateral;  . CYSTOSCOPY WITH RETROGRADE PYELOGRAM, URETEROSCOPY AND STENT PLACEMENT Bilateral 11/25/2016   Procedure: CYSTOSCOPY BILATERAL STENT REMOVAL  BILATERAL, URETEROSCOPY STONE REMOVAL  AND left  STENT REPLACEMENT with holmium laser;  Surgeon: Ihor Gully, MD;  Location: [facility];  Service: Urology;  Laterality: Bilateral;  . CYSTOSCOPY WITH URETEROSCOPY  10/19/2011   Procedure: CYSTOSCOPY WITH URETEROSCOPY;  Surgeon: Milford Cage, MD;  Location: [facility];  Service: Urology;  Laterality: Left;  cystoscopy, left ureteroscopy, holmium laser, lithotripsey left ureter stent placement   . EXTRACORPOREAL SHOCK WAVE LITHOTRIPSY Left 08/18/2011  . EXTRACORPOREAL  SHOCK WAVE LITHOTRIPSY Left 10/17/2016   Procedure: LEFT EXTRACORPOREAL SHOCK WAVE LITHOTRIPSY (ESWL);  Surgeon: Sebastian Ache, MD;  Location: [facility];  Service: Urology;  Laterality: Left;  . HOLMIUM LASER APPLICATION Bilateral 11/25/2016   Procedure: HOLMIUM LASER APPLICATION;  Surgeon: Ihor Gully, MD;  Location: [facility];  Service: Urology;  Laterality: Bilateral;    Home Medications:  Prescriptions Prior to Admission  Medication Sig Dispense Refill Last Dose  . aspirin EC 81 MG tablet Take 81 mg by mouth daily.   12/22/2016 at Unknown time  . atorvastatin (LIPITOR) 40 MG tablet Take 40 mg by mouth every morning.    12/22/2016 at Unknown time  . esomeprazole (NEXIUM) 40 MG capsule Take 40 mg by mouth daily as needed (reflux).   12/22/2016 at Unknown time  . famotidine (PEPCID) 20 MG tablet Take 20 mg by mouth 2 (two) times daily as needed for heartburn. Heart burn    Past Month at Unknown time  . GARLIC PO Take 1 capsule by mouth 2 (two) times daily.   12/22/2016 at Unknown time  . ibuprofen (ADVIL,MOTRIN) 200 MG tablet Take 600 mg by mouth every 6 (six) hours as needed for mild pain.   12/22/2016 at Unknown time  . metFORMIN (GLUCOPHAGE) 1000 MG tablet Take 1,000 mg by mouth 2 (two) times daily with a meal.   12/22/2016 at Unknown time  . olmesartan (BENICAR) 40 MG tablet Take 40 mg by mouth every evening.   12/22/2016 at Unknown time  . Omega-3 Fatty Acids (FISH OIL) 1000 MG CPDR Take 1,000 mg by mouth 2 (two) times  daily.   12/22/2016 at Unknown time  . promethazine (PHENERGAN) 25 MG tablet Take 1 tablet (25 mg total) by mouth every 6 (six) hours as needed for nausea or vomiting. 30 tablet 0 Past Month at Unknown time  . saccharomyces boulardii (FLORASTOR) 250 MG capsule Take 500 mg by mouth 2 (two) times daily.   12/22/2016 at Unknown time  . Oxycodone HCl 10 MG TABS Take 1 tablet (10 mg total) by mouth every 4 (four) hours as needed. (Patient not taking: Reported on 12/23/2016) 30 tablet  0 Not Taking at Unknown time  . oxyCODONE-acetaminophen (ROXICET) 5-325 MG tablet Take 1-2 tablets by mouth every 6 (six) hours as needed for moderate pain or severe pain. After lithotripsy. (Patient not taking: Reported on 12/23/2016) 30 tablet 0 Not Taking at Unknown time  . phenazopyridine (PYRIDIUM) 200 MG tablet Take 1 tablet (200 mg total) by mouth 3 (three) times daily as needed for pain. (Patient not taking: Reported on 12/23/2016) 30 tablet 0 Not Taking at Unknown time  . senna-docusate (SENOKOT-S) 8.6-50 MG tablet Take 1 tablet by mouth at bedtime as needed for mild constipation. (Patient not taking: Reported on 12/23/2016) 30 tablet 0 Not Taking at Unknown time  . tamsulosin (FLOMAX) 0.4 MG CAPS capsule Take 1 capsule (0.4 mg total) by mouth daily. To help pass kidney stone fragments. (Patient not taking: Reported on 12/23/2016) 30 capsule 1 Not Taking at Unknown time   Allergies:  Allergies  Allergen Reactions  . Ciprofloxacin Hives  . Sulfamethoxazole-Trimethoprim Hives    History reviewed. No pertinent family history. Social History:  reports that he has quit smoking. His smoking use included Cigarettes. He quit after 25.00 years of use. He has never used smokeless tobacco. He reports that he does not drink alcohol or use drugs.  Review of Systems  Constitutional: Positive for chills, fever and malaise/fatigue.  Gastrointestinal: Positive for nausea and vomiting.  Genitourinary: Positive for dysuria, flank pain and urgency.  All other systems reviewed and are negative.   Physical Exam:  Vital signs in last 24 hours: Temp:  [98.7 F (37.1 C)-101 F (38.3 C)] 101 F (38.3 C) (08/10 0744) Pulse Rate:  [94-125] 125 (08/10 0744) Resp:  [15-25] 25 (08/10 0744) BP: (85-116)/(55-72) 116/65 (08/10 0744) SpO2:  [93 %-100 %] 98 % (08/10 0744) Weight:  [83.9 kg (185 lb)] 83.9 kg (185 lb) (08/10 0056) Physical Exam  Constitutional: He is oriented to person, place, and time. He appears  well-developed and well-nourished.  HENT:  Head: Normocephalic and atraumatic.  Eyes: Pupils are equal, round, and reactive to light. EOM are normal.  Neck: Normal range of motion. No thyromegaly present.  Cardiovascular: Normal rate and regular rhythm.   Respiratory: Effort normal. No respiratory distress.  GI: Soft. He exhibits no distension.  Musculoskeletal: Normal range of motion. He exhibits no edema.  Neurological: He is alert and oriented to person, place, and time.  Skin: Skin is warm and dry.  Psychiatric: He has a normal mood and affect. His behavior is normal. Judgment and thought content normal.    Laboratory Data:  Results for orders placed or performed during the hospital encounter of 12/23/16 (from the past 24 hour(s))  Basic metabolic panel     Status: Abnormal   Collection Time: 12/23/16  1:08 AM  Result Value Ref Range   Sodium 135 135 - 145 mmol/L   Potassium 3.9 3.5 - 5.1 mmol/L   Chloride 102 101 - 111 mmol/L   CO2 22  22 - 32 mmol/L   Glucose, Bld 186 (H) 65 - 99 mg/dL   BUN 28 (H) 6 - 20 mg/dL   Creatinine, Ser 4.092.78 (H) 0.61 - 1.24 mg/dL   Calcium 9.1 8.9 - 81.110.3 mg/dL   GFR calc non Af Amer 23 (L) >60 mL/min   GFR calc Af Amer 27 (L) >60 mL/min   Anion gap 11 5 - 15  CBC     Status: Abnormal   Collection Time: 12/23/16  1:08 AM  Result Value Ref Range   WBC 13.9 (H) 4.0 - 10.5 K/uL   RBC 4.07 (L) 4.22 - 5.81 MIL/uL   Hemoglobin 10.8 (L) 13.0 - 17.0 g/dL   HCT 91.433.5 (L) 78.239.0 - 95.652.0 %   MCV 82.3 78.0 - 100.0 fL   MCH 26.5 26.0 - 34.0 pg   MCHC 32.2 30.0 - 36.0 g/dL   RDW 21.316.2 (H) 08.611.5 - 57.815.5 %   Platelets 291 150 - 400 K/uL  Differential     Status: Abnormal   Collection Time: 12/23/16  1:08 AM  Result Value Ref Range   Neutrophils Relative % 84 %   Neutro Abs 11.1 (H) 1.7 - 7.7 K/uL   Lymphocytes Relative 4 %   Lymphs Abs 0.5 (L) 0.7 - 4.0 K/uL   Monocytes Relative 12 %   Monocytes Absolute 1.5 (H) 0.1 - 1.0 K/uL   Eosinophils Relative 0 %    Eosinophils Absolute 0.0 0.0 - 0.7 K/uL   Basophils Relative 0 %   Basophils Absolute 0.0 0.0 - 0.1 K/uL  Hepatic function panel     Status: Abnormal   Collection Time: 12/23/16  1:08 AM  Result Value Ref Range   Total Protein 7.2 6.5 - 8.1 g/dL   Albumin 4.3 3.5 - 5.0 g/dL   AST 18 15 - 41 U/L   ALT 17 17 - 63 U/L   Alkaline Phosphatase 43 38 - 126 U/L   Total Bilirubin 0.5 0.3 - 1.2 mg/dL   Bilirubin, Direct <4.6<0.1 (L) 0.1 - 0.5 mg/dL   Indirect Bilirubin NOT CALCULATED 0.3 - 0.9 mg/dL  Urinalysis, Routine w reflex microscopic- may I&O cath if menses     Status: Abnormal   Collection Time: 12/23/16  1:15 AM  Result Value Ref Range   Color, Urine YELLOW YELLOW   APPearance CLOUDY (A) CLEAR   Specific Gravity, Urine 1.015 1.005 - 1.030   pH 5.0 5.0 - 8.0   Glucose, UA NEGATIVE NEGATIVE mg/dL   Hgb urine dipstick MODERATE (A) NEGATIVE   Bilirubin Urine NEGATIVE NEGATIVE   Ketones, ur 5 (A) NEGATIVE mg/dL   Protein, ur 30 (A) NEGATIVE mg/dL   Nitrite POSITIVE (A) NEGATIVE   Leukocytes, UA LARGE (A) NEGATIVE   RBC / HPF 6-30 0 - 5 RBC/hpf   WBC, UA TOO NUMEROUS TO COUNT 0 - 5 WBC/hpf   Bacteria, UA MANY (A) NONE SEEN   Squamous Epithelial / LPF 0-5 (A) NONE SEEN   WBC Clumps PRESENT    Mucous PRESENT   I-Stat CG4 Lactic Acid, ED     Status: None   Collection Time: 12/23/16  4:12 AM  Result Value Ref Range   Lactic Acid, Venous 1.20 0.5 - 1.9 mmol/L  Glucose, capillary     Status: Abnormal   Collection Time: 12/23/16  8:09 AM  Result Value Ref Range   Glucose-Capillary 136 (H) 65 - 99 mg/dL   No results found for this or any previous visit (from  the past 240 hour(s)). Creatinine:  Recent Labs  12/23/16 0108  CREATININE 2.78*   Baseline Creatinine:  1.3  Impression/Assessment:  61yo with right ureteral calculus, left hydronephrosis, sepsis from a urinary source   Plan:  1. Admit for IV antibiotics 2. The risks/benefits/alternatives to bilateral ureteral stent  placement was explained to the patient and he understands and wishes to proceed with surgery  Wilkie Aye 12/23/2016, 8:13 AM

## 2016-12-23 NOTE — Progress Notes (Signed)
PHARMACY - PHYSICIAN COMMUNICATION CRITICAL VALUE ALERT - BLOOD CULTURE IDENTIFICATION (BCID)  Results for orders placed or performed during the hospital encounter of 12/23/16  Blood Culture ID Panel (Reflexed) (Collected: 12/23/2016  3:59 AM)  Result Value Ref Range   Enterococcus species NOT DETECTED NOT DETECTED   Vancomycin resistance NOT DETECTED NOT DETECTED   Listeria monocytogenes NOT DETECTED NOT DETECTED   Staphylococcus species NOT DETECTED NOT DETECTED   Staphylococcus aureus NOT DETECTED NOT DETECTED   Methicillin resistance NOT DETECTED NOT DETECTED   Streptococcus species NOT DETECTED NOT DETECTED   Streptococcus agalactiae NOT DETECTED NOT DETECTED   Streptococcus pneumoniae NOT DETECTED NOT DETECTED   Streptococcus pyogenes NOT DETECTED NOT DETECTED   Acinetobacter baumannii NOT DETECTED NOT DETECTED   Enterobacteriaceae species DETECTED (A) NOT DETECTED   Enterobacter cloacae complex NOT DETECTED NOT DETECTED   Escherichia coli DETECTED (A) NOT DETECTED   Klebsiella oxytoca NOT DETECTED NOT DETECTED   Klebsiella pneumoniae NOT DETECTED NOT DETECTED   Proteus species NOT DETECTED NOT DETECTED   Serratia marcescens NOT DETECTED NOT DETECTED   Carbapenem resistance NOT DETECTED NOT DETECTED   Haemophilus influenzae NOT DETECTED NOT DETECTED   Neisseria meningitidis NOT DETECTED NOT DETECTED   Pseudomonas aeruginosa NOT DETECTED NOT DETECTED   Candida albicans NOT DETECTED NOT DETECTED   Candida glabrata NOT DETECTED NOT DETECTED   Candida krusei NOT DETECTED NOT DETECTED   Candida parapsilosis NOT DETECTED NOT DETECTED   Candida tropicalis NOT DETECTED NOT DETECTED    Name of physician (or Provider) Contacted: Dr. Molli KnockYacoub  Changes to prescribed antibiotics required: continue ceftriaxone, increase dose to 2gm q24h  Dannielle HuhZeigler, Mozella Rexrode George 12/23/2016  9:44 PM

## 2016-12-23 NOTE — Anesthesia Procedure Notes (Signed)
Procedure Name: LMA Insertion Date/Time: 12/23/2016 8:41 AM Performed by: Ludwig LeanJONES, Amee Boothe C Pre-anesthesia Checklist: Patient identified, Emergency Drugs available, Suction available and Patient being monitored Patient Re-evaluated:Patient Re-evaluated prior to induction Oxygen Delivery Method: Circle system utilized Preoxygenation: Pre-oxygenation with 100% oxygen Induction Type: IV induction Ventilation: Mask ventilation without difficulty LMA: LMA with gastric port inserted LMA Size: 4.0 Number of attempts: 1 Placement Confirmation: positive ETCO2 and breath sounds checked- equal and bilateral Tube secured with: Tape Dental Injury: Teeth and Oropharynx as per pre-operative assessment

## 2016-12-23 NOTE — H&P (Signed)
History and Physical:    Austin Gould   NFA:[id number] DOB: 04/27/1956 DOA: 12/23/2016  Referring MD/provider: Dr. Elesa Massed PCP: Barbie Banner, MD   Patient coming from: Home  Chief Complaint: Right lateral flank pain, fevers and malaise.  History of Present Illness:   Austin Gould is an 61 y.o. male with past medical history significant for bilateral nephrolithiasis who is status post stent placement 1 month ago who was in his usual state of health until day before admission when he started having left flank pain. 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 Patient went home and continued to feel very sick and weak. His wife noted a fever to 103 and he presented to the emergency room.  ED Course:  In the emergency room patient was noted to have fever, hypotension, leukocytosis and a dirty urine. In addition he was noted to have severe left-sided hydronephrosis with with distention of the left ureter but no obstructing stone. He also had mild right-sided hydronephrosis. Patient was treated with ceftriaxone in the ED. He was taken to cystoscopy suite and bilateral stents were placed and patient was treated with Zosyn. Patient states that he started feeling much better within an hour after the stents were placed.  ROS:   ROS patient admits to some nausea but no vomiting. No abdominal pain or diarrhea. No falls or syncope or presyncope.   Past Medical History:   Past Medical History:  Diagnosis Date  . Bilateral ureteral calculi 10/04/2016  . Diabetes mellitus    type 2  . GERD (gastroesophageal reflux disease)    PRN MEDS as need  . History of kidney stones 10/04/2016   bil. per CT  . Hyperlipidemia   . Hypertension   . Umbilical hernia     Past Surgical History:   Past Surgical History:  Procedure Laterality Date  . CYSTOSCOPY W/ URETERAL STENT PLACEMENT Bilateral 10/04/2016     Procedure: CYSTOSCOPY WITH RETROGRADE PYELOGRAM/BILATERAL URETERAL STENT PLACEMENT;  Surgeon: Ihor Gully, MD;  Location: [facility];  Service: Urology;  Laterality: Bilateral;  . CYSTOSCOPY W/ URETERAL STENT PLACEMENT Bilateral 12/23/2016   Procedure: CYSTOSCOPY WITH RETROGRADE PYELOGRAM/URETERAL BILATERAL STENT PLACEMENT;  Surgeon: Malen Gauze, MD;  Location: [facility];  Service: Urology;  Laterality: Bilateral;  . CYSTOSCOPY WITH RETROGRADE PYELOGRAM, URETEROSCOPY AND STENT PLACEMENT Bilateral 11/25/2016   Procedure: CYSTOSCOPY BILATERAL STENT REMOVAL  BILATERAL, URETEROSCOPY STONE REMOVAL  AND left  STENT REPLACEMENT with holmium laser;  Surgeon: Ihor Gully, MD;  Location: [facility];  Service: Urology;  Laterality: Bilateral;  . CYSTOSCOPY WITH URETEROSCOPY  10/19/2011   Procedure: CYSTOSCOPY WITH URETEROSCOPY;  Surgeon: Milford Cage, MD;  Location: [facility];  Service: Urology;  Laterality: Left;  cystoscopy, left ureteroscopy, holmium laser, lithotripsey left ureter stent placement   . EXTRACORPOREAL SHOCK WAVE LITHOTRIPSY Left 08/18/2011  . EXTRACORPOREAL SHOCK WAVE LITHOTRIPSY Left 10/17/2016   Procedure: LEFT EXTRACORPOREAL SHOCK WAVE LITHOTRIPSY (ESWL);  Surgeon: Sebastian Ache, MD;  Location: [facility];  Service: Urology;  Laterality: Left;  . HOLMIUM LASER APPLICATION Bilateral 11/25/2016   Procedure: HOLMIUM LASER APPLICATION;  Surgeon: Ihor Gully, MD;  Location: [facility];  Service: Urology;  Laterality: Bilateral;    Social History:   Social History   Social History  . Marital status: Married    Spouse name:  N/A  . Number of children: N/A  . Years of education: N/A   Occupational History  . Not on file.   Social History Main Topics  . Smoking status: Former Smoker    Years: 25.00    Types: Cigarettes  . Smokeless tobacco: Never Used     Comment: occasional smoker as teen  . Alcohol use No  . Drug use: No   . Sexual activity: Not on file   Other Topics Concern  . Not on file   Social History Narrative  . No narrative on file    Allergies   Ciprofloxacin and Sulfamethoxazole-trimethoprim  Family history:   History reviewed. No pertinent family history.  Current Medications:   Prior to Admission medications   Medication Sig Start Date End Date Taking? Authorizing Provider  aspirin EC 81 MG tablet Take 81 mg by mouth daily.   Yes [provider]  atorvastatin (LIPITOR) 40 MG tablet Take 40 mg by mouth every morning.    Yes [provider]  esomeprazole (NEXIUM) 40 MG capsule Take 40 mg by mouth daily as needed (reflux).   Yes [provider]  famotidine (PEPCID) 20 MG tablet Take 20 mg by mouth 2 (two) times daily as needed for heartburn. Heart burn    Yes [provider]  GARLIC PO Take 1 capsule by mouth 2 (two) times daily.   Yes [provider]  ibuprofen (ADVIL,MOTRIN) 200 MG tablet Take 600 mg by mouth every 6 (six) hours as needed for mild pain.   Yes [provider]  metFORMIN (GLUCOPHAGE) 1000 MG tablet Take 1,000 mg by mouth 2 (two) times daily with a meal.   Yes [provider]  olmesartan (BENICAR) 40 MG tablet Take 40 mg by mouth every evening.   Yes [provider]  Omega-3 Fatty Acids (FISH OIL) 1000 MG CPDR Take 1,000 mg by mouth 2 (two) times daily.   Yes [provider]  promethazine (PHENERGAN) 25 MG tablet Take 1 tablet (25 mg total) by mouth every 6 (six) hours as needed for nausea or vomiting. 10/17/16  Yes Sebastian AcheManny, Theodore, MD  saccharomyces boulardii (FLORASTOR) 250 MG capsule Take 500 mg by mouth 2 (two) times daily.   Yes [provider]  Oxycodone HCl 10 MG TABS Take 1 tablet (10 mg total) by mouth every 4 (four) hours as needed. Patient not taking: Reported on 12/23/2016 11/25/16   Ihor Gullyttelin, Mark, MD  oxyCODONE-acetaminophen (ROXICET) 5-325 MG tablet Take 1-2 tablets by mouth  every 6 (six) hours as needed for moderate pain or severe pain. After lithotripsy. Patient not taking: Reported on 12/23/2016 10/17/16 10/17/17  Sebastian AcheManny, Theodore, MD  phenazopyridine (PYRIDIUM) 200 MG tablet Take 1 tablet (200 mg total) by mouth 3 (three) times daily as needed for pain. Patient not taking: Reported on 12/23/2016 11/25/16   Ihor Gullyttelin, Mark, MD  senna-docusate (SENOKOT-S) 8.6-50 MG tablet Take 1 tablet by mouth at bedtime as needed for mild constipation. Patient not taking: Reported on 12/23/2016 10/05/16   Marguerita MerlesSheikh, Omair Latif, DO  tamsulosin (FLOMAX) 0.4 MG CAPS capsule Take 1 capsule (0.4 mg total) by mouth daily. To help pass kidney stone fragments. Patient not taking: Reported on 12/23/2016 10/17/16   Sebastian AcheManny, Theodore, MD    Physical Exam:   Vitals:   12/23/16 1545 12/23/16 1600 12/23/16 1641 12/23/16 1700  BP: 102/68 96/68 115/81 112/71  Pulse: 85 76 83 86  Resp: 18 18 20 17   Temp:  98.3 F (36.8 C)  98.1 F (36.7 C)   TempSrc:      SpO2: 96% 98% 94% 94%  Weight:      Height:         Physical Exam: Blood pressure 112/71, pulse 86, temperature 98.1 F (36.7 C), resp. rate 17, height 5\' 5"  (1.651 m), weight 83.9 kg (185 lb), SpO2 94 %. BJY:[id number] diaphoretic patient sitting up in stretcher with wet rag on forehead. Head: Normocephalic, atraumatic. Eyes: Sclera anicteric.  Neck: no jugular venous distention. Chest: Lungs are clear to auscultation with good air movement. No rales, rhonchi or wheezes.  CV: Heart sounds are regular with an S1, S2. No murmurs, rubs, clicks, or gallops.  Abdomen: Soft, nontender, nondistended with normal active bowel sounds. positive bilateral CVA tenderness.  Extremities: Extremities are without clubbing, or cyanosis. No edema. Pedal pulses 2+.  Skin: Warm and dry. No rashes, lesions or wounds. Neuro: Alert and oriented times 3; grossly nonfocal.  Psych: Insight is good and judgment is appropriate. Mood and affect normal.   Data Review:     Labs: Basic Metabolic Panel:  Recent Labs Lab 12/23/16 0108 12/23/16 1006  NA 135 138  K 3.9 4.4  CL 102 111  CO2 22 18*  GLUCOSE 186* 195*  BUN 28* 26*  CREATININE 2.78* 2.43*  CALCIUM 9.1 7.1*   Liver Function Tests:  Recent Labs Lab 12/23/16 0108  AST 18  ALT 17  ALKPHOS 43  BILITOT 0.5  PROT 7.2  ALBUMIN 4.3   No results for input(s): LIPASE, AMYLASE in the last 168 hours. No results for input(s): AMMONIA in the last 168 hours. CBC:  Recent Labs Lab 12/23/16 0108 12/23/16 1006  WBC 13.9* 9.9  NEUTROABS 11.1*  --   HGB 10.8* 8.9*  HCT 33.5* 26.5*  MCV 82.3 82.8  PLT 291 216   Cardiac Enzymes: No results for input(s): CKTOTAL, CKMB, CKMBINDEX, TROPONINI in the last 168 hours.  BNP (last 3 results) No results for input(s): PROBNP in the last 8760 hours. CBG:  Recent Labs Lab 12/23/16 0809 12/23/16 0931 12/23/16 1221 12/23/16 1644  GLUCAP 136* 178* 210* 279*    Urinalysis    Component Value Date/Time   COLORURINE YELLOW 12/23/2016 0115   APPEARANCEUR CLOUDY (A) 12/23/2016 0115   LABSPEC 1.015 12/23/2016 0115   PHURINE 5.0 12/23/2016 0115   GLUCOSEU NEGATIVE 12/23/2016 0115   HGBUR MODERATE (A) 12/23/2016 0115   BILIRUBINUR NEGATIVE 12/23/2016 0115   KETONESUR 5 (A) 12/23/2016 0115   PROTEINUR 30 (A) 12/23/2016 0115   NITRITE POSITIVE (A) 12/23/2016 0115   LEUKOCYTESUR LARGE (A) 12/23/2016 0115      Radiographic Studies: Dg Chest Port 1 View  Result Date: 12/23/2016 CLINICAL DATA:  Postoperative fever.  Atelectasis. EXAM: PORTABLE CHEST 1 VIEW COMPARISON:  Abdominal CT from earlier today FINDINGS: Normal heart size and mediastinal contours for technique. There is no edema, consolidation, effusion, or pneumothorax. No acute osseous finding. IMPRESSION: Negative portable chest. Electronically Signed   By: Marnee Spring M.D.   On: 12/23/2016 15:56   Dg C-arm 1-60 Min-no Report  Result Date: 12/23/2016 Fluoroscopy was utilized by  the requesting physician.  No radiographic interpretation.   Ct Renal Stone Study  Result Date: 12/23/2016 CLINICAL DATA:  Acute onset of left flank pain, fever, nausea and vomiting. Leukocytosis. Initial encounter. EXAM: CT ABDOMEN AND PELVIS WITHOUT CONTRAST TECHNIQUE: Multidetector CT imaging of the abdomen and pelvis was performed following the standard protocol without IV contrast. COMPARISON:  CT of the abdomen  and pelvis performed 10/04/2016 FINDINGS: Lower chest: The visualized lung bases are grossly clear. The visualized portions of the mediastinum are unremarkable. Hepatobiliary: The liver is unremarkable in appearance. The gallbladder is unremarkable in appearance. The common bile duct remains normal in caliber. Pancreas: The pancreas is within normal limits. Spleen: The spleen is unremarkable in appearance. Adrenals/Urinary Tract: The adrenal glands are unremarkable in appearance. Moderate to severe left-sided hydronephrosis is noted, with left-sided perinephric stranding. There is distention of the left ureter along its entire course. No distal obstructing stone is seen. This may reflect a recently passed stone. Mild right-sided hydronephrosis is noted, reflecting an obstructing 8 x 5 mm stone proximally at the right ureteropelvic junction. Scattered nonobstructing stones are noted within the left kidney, measuring up to 5 mm in size. Stomach/Bowel: The stomach is unremarkable in appearance. The small bowel is within normal limits. The appendix is not visualized; there is no evidence for appendicitis. The colon is unremarkable in appearance. Vascular/Lymphatic: Scattered calcification is seen along the abdominal aorta and its branches. The abdominal aorta is otherwise grossly unremarkable. The inferior vena cava is grossly unremarkable. No retroperitoneal lymphadenopathy is seen. No pelvic sidewall lymphadenopathy is identified. Reproductive: The bladder is mildly distended and grossly unremarkable.  The prostate is enlarged, measuring 5.1 cm in transverse dimension. Other: A moderate umbilical hernia is noted, containing only fat. Musculoskeletal: No acute osseous abnormalities are identified. The visualized musculature is unremarkable in appearance. IMPRESSION: 1. Moderate to severe left-sided hydronephrosis. Distention of the left ureter along its entire course. No distal obstructing stone seen. This may reflect a recently passed stone. 2. Mild right-sided hydronephrosis, reflecting an obstructing 8 x 5 mm stone proximally at the right ureteropelvic junction. Would follow the patient's renal function closely, given bilateral hydronephrosis. 3. Scattered nonobstructing left renal stones measure up to 5 mm in size. 4. Scattered aortic atherosclerosis. 5. Enlarged prostate noted. 6. Moderate umbilical hernia, containing only fat. Electronically Signed   By: Roanna Raider M.D.   On: 12/23/2016 06:16      Assessment/Plan:   Principal Problem:   Ureteral stone with hydronephrosis Active Problems:   HTN (hypertension)   DM (diabetes mellitus), type 2 (HCC)   Sepsis (HCC)   S/P cystoscopy  SEPSIS We'll continue treatment with aggressive fluid resuscitation and broad-spectrum antibiotics with Zosyn day #1. At present patient is requiring pressor support with peripheral Neo-Synephrine. Appreciate CCM management. Patient is subjectively much improved after placement of bilateral stents. Urine and blood cultures are pending.  HTN Patient is presently hypotensive. Serafina Royals is being held.  DM Metformin is being held. Continue sliding scale insulin.   Other information:   DVT prophylaxis: Lovenox ordered. Code Status: Full code. Family Communication: Patient's friend was at bedside throughout encounter Disposition Plan: Home Consults called: Urology Admission status: inpatient  The medical decision making on this patient was of high complexity and the patient is at high risk for  clinical deterioration, therefore this is a level 3 visit.   Horatio Pel Orma Flaming [facility] Pager [phone] Cell: [phone]   If 7PM-7AM, please contact night-coverage [email or web address] Password [email or web address] 12/23/2016, 6:00 PM

## 2016-12-23 NOTE — Progress Notes (Signed)
eLink Physician-Brief Progress Note Patient Name: Austin Gould L Meyn DOB: 06/04/1955 MRN: [id number]   Date of Service  12/23/2016  HPI/Events of Note  Request for a diet - Patient has DM.  eICU Interventions  Will order: 1. Carb modified clear liquid diet.      Intervention Category Intermediate Interventions: Other:  Sommer,Steven Dennard Nipugene 12/23/2016, 6:50 PM

## 2016-12-23 NOTE — Progress Notes (Signed)
eLink Physician-Brief Progress Note Patient Name: Austin Gould DOB: 07/14/1955 MRN: [id number]   Date of Service  12/23/2016  HPI/Events of Note  Lactic Acid = 1.2 --> 2.4.   eICU Interventions  Will order: 1. Bolus with 0.9 NaCl 1 liter IV over 1 hour now.  2. Continue to trend Lactic Acid.      Intervention Category Major Interventions: Acid-Base disturbance - evaluation and management  Sommer,Steven Eugene 12/23/2016, 6:34 PM

## 2016-12-23 NOTE — ED Provider Notes (Signed)
TIME SEEN: 3:29 AM  CHIEF COMPLAINT: Fever, flank pain  HPI: Patient is a 61 year old male with history of kidney stones who presents to the emergency department with complaints of fever that started yesterday, nausea and vomiting, body aches and achy left-sided flank pain. Patient recently had a history of bilateral kidney stones in May. He underwent cystoscopy and bilateral ureteral stent placement by Dr. Vernie Ammonsttelin on July 13. States he had the stents removed in the office approximately 2 weeks ago. He was previously on antibiotics but also finished these 2 weeks ago. He states he had been feeling well until today. No diarrhea. No abdominal pain. No recent travel, sick contacts. No tick bite. No headache, neck pain or neck stiffness. No cough.  ROS: See HPI Constitutional:  fever  Eyes: no drainage  ENT: no runny nose   Cardiovascular:  no chest pain  Resp: no SOB  GI:  vomiting GU:  dysuria Integumentary: no rash  Allergy: no hives  Musculoskeletal: no leg swelling  Neurological: no slurred speech ROS otherwise negative  PAST MEDICAL HISTORY/PAST SURGICAL HISTORY:  Past Medical History:  Diagnosis Date  . Bilateral ureteral calculi 10/04/2016  . Diabetes mellitus    type 2  . GERD (gastroesophageal reflux disease)    PRN MEDS as need  . History of kidney stones 10/04/2016   bil. per CT  . Hyperlipidemia   . Hypertension   . Umbilical hernia     MEDICATIONS:  Prior to Admission medications   Medication Sig Start Date End Date Taking? Authorizing Provider  aspirin EC 81 MG tablet Take 81 mg by mouth daily.    [provider]  atorvastatin (LIPITOR) 40 MG tablet Take 40 mg by mouth every morning.     [provider]  esomeprazole (NEXIUM) 40 MG capsule Take 40 mg by mouth daily as needed (reflux).    [provider]  famotidine (PEPCID) 20 MG tablet Take 20 mg by mouth 2 (two) times daily as needed for heartburn. Heart burn     [provider]  GARLIC PO Take 1 capsule by mouth 2 (two) times daily.    [provider]  metFORMIN (GLUCOPHAGE) 1000 MG tablet Take 1,000 mg by mouth 2 (two) times daily with a meal.    [provider]  Mirabegron (MYRBETRIQ PO) Take by mouth.    [provider]  nystatin cream (MYCOSTATIN) Apply 1 application topically daily as needed for dry skin.     [provider]  olmesartan (BENICAR) 40 MG tablet Take 40 mg by mouth every evening.    [provider]  Omega-3 Fatty Acids (FISH OIL) 1000 MG CPDR Take 1,000 mg by mouth 2 (two) times daily.    [provider]  Oxycodone HCl 10 MG TABS Take 1 tablet (10 mg total) by mouth every 4 (four) hours as needed. 11/25/16   Ihor Gullyttelin, Mark, MD  oxyCODONE-acetaminophen (ROXICET) 5-325 MG tablet Take 1-2 tablets by mouth every 6 (six) hours as needed for moderate pain or severe pain. After lithotripsy. 10/17/16 10/17/17  Sebastian AcheManny, Theodore, MD  phenazopyridine (PYRIDIUM) 200 MG tablet Take 200 mg by mouth 3 (three) times daily as needed for pain.    [provider]  phenazopyridine (PYRIDIUM) 200 MG tablet Take 1 tablet (200 mg total) by mouth 3 (three) times daily as needed for pain. 11/25/16   Ihor Gullyttelin, Mark, MD  promethazine (PHENERGAN) 25 MG tablet Take 1 tablet (25 mg total) by mouth every 6 (six)  hours as needed for nausea or vomiting. 10/17/16   Sebastian Ache, MD  senna-docusate (SENOKOT-S) 8.6-50 MG tablet Take 1 tablet by mouth at bedtime as needed for mild constipation. 10/05/16   Marguerita Merles Latif, DO  tamsulosin (FLOMAX) 0.4 MG CAPS capsule Take 1 capsule (0.4 mg total) by mouth daily. To help pass kidney stone fragments. 10/17/16   Sebastian Ache, MD  trospium (SANCTURA) 20 MG tablet Take 20 mg by mouth 2 (two) times daily.    [provider]    ALLERGIES:  Allergies  Allergen Reactions  . Ciprofloxacin Hives  . Sulfamethoxazole-Trimethoprim Hives    SOCIAL HISTORY:  Social History   Substance Use Topics  . Smoking status: Former Smoker    Years: 25.00    Types: Cigarettes  . Smokeless tobacco: Never Used     Comment: occasional smoker as teen  . Alcohol use No    FAMILY HISTORY: No family history on file.  EXAM: BP 109/60 (BP Location: Left Arm)   Pulse (!) 124   Temp (!) 100.8 F (38.2 C) (Oral)   Resp 18   Ht 5\' 5"  (1.651 m)   Wt 83.9 kg (185 lb)   SpO2 97%   BMI 30.79 kg/m  CONSTITUTIONAL: Alert and oriented and responds appropriately to questions. Well-appearing; well-nourished, Febrile but nontoxic-appearing, well-hydrated HEAD: Normocephalic EYES: Conjunctivae clear, pupils appear equal, EOMI ENT: normal nose; moist mucous membranes NECK: Supple, no meningismus, no nuchal rigidity, no LAD  CARD: RRR; S1 and S2 appreciated; no murmurs, no clicks, no rubs, no gallops RESP: Normal chest excursion without splinting or tachypnea; breath sounds clear and equal bilaterally; no wheezes, no rhonchi, no rales, no hypoxia or respiratory distress, speaking full sentences ABD/GI: Normal bowel sounds; non-distended; soft, non-tender, no rebound, no guarding, no peritoneal signs, no hepatosplenomegaly BACK:  The back appears normal and is non-tender to palpation, there is no CVA tenderness, no midline step-off or deformity EXT: Normal ROM in all joints; non-tender to palpation; no edema; normal capillary refill; no cyanosis, no calf tenderness or swelling    SKIN: Normal color for age and race; warm; no rash NEURO: Moves all extremities equally PSYCH: The patient's mood and manner are appropriate. Grooming and personal hygiene are appropriate.  MEDICAL DECISION MAKING: Patient here with left flank pain, fever. History of recent lateral renal stones. Labs show leukocytosis with left shift. He does appear to have a urinary tract infection. We'll send urine culture. We'll obtain blood cultures, lactate as well. We'll give IV ceftriaxone. Previous urine culture in our  system and has shown no growth. Will obtain CT scan to evaluate for possible recurrent kidney stone. Concern for possible pyelonephritis. We'll give IV fluids, pain and nausea medication. He does appear to have acute renal failure. I feel patient will need admission.  ED PROGRESS: Patient did have a drop in his blood pressure. It is improving quickly with IV hydration. His lactate is normal.     Patient CT scan shows moderate to severe left-sided hydronephrosis with distention along the left ureter with no distal obstructing stone. This may reflect recently passed stone. He also has mild right hydronephrosis with an obstructing 8 x 5 mm stone approximately at the right UPJ. Given he is septic with hypotension and acute renal failure, will admit to medicine. I did discuss this with Dr. Mena Goes with urology. He will talk to the oncoming urologist to see the patient emergently.  We appreciate urology help.  7:31 AM Discussed patient's case with  hospitalist, Dr. Marin Olp.  I have recommended admission and patient (and family if present) agree with this plan. Admitting physician will place admission orders.   I reviewed all nursing notes, vitals, pertinent previous records, EKGs, lab and urine results, imaging (as available).   CRITICAL CARE Performed by: Raelyn Number   Total critical care time: 45 minutes  Critical care time was exclusive of separately billable procedures and treating other patients.  Critical care was necessary to treat or prevent imminent or life-threatening deterioration.  Critical care was time spent personally by me on the following activities: development of treatment plan with patient and/or surrogate as well as nursing, discussions with consultants, evaluation of patient's response to treatment, examination of patient, obtaining history from patient or surrogate, ordering and performing treatments and interventions, ordering and review of laboratory studies, ordering  and review of radiographic studies, pulse oximetry and re-evaluation of patient's condition.     Ezella Kell, Layla Maw, DO 12/23/16 [phone]

## 2016-12-23 NOTE — Transfer of Care (Signed)
Immediate Anesthesia Transfer of Care Note  Patient: Halina Maidenserry L Sweezy  Procedure(s) Performed: Procedure(s): CYSTOSCOPY WITH RETROGRADE PYELOGRAM/URETERAL BILATERAL STENT PLACEMENT (Bilateral)  Patient Location: PACU  Anesthesia Type:general  Level of Consciousness: Patient easily awoken, sedated, comfortable, cooperative, following commands, responds to stimulation.   Airway & Oxygen Therapy: Patient spontaneously breathing, ventilating well, oxygen via simple oxygen mask.  Post-op Assessment: Report given to PACU RN, vital signs reviewed and stable, moving all extremities.   Post vital signs: Reviewed and stable.  Complications: No apparent anesthesia complications Last Vitals:  Vitals:   12/23/16 0700 12/23/16 0744  BP: 107/66 116/65  Pulse: (!) 116 (!) 125  Resp: (!) 23 (!) 25  Temp:  (!) 38.3 C  SpO2: 93% 98%    Last Pain:  Vitals:   12/23/16 0744  TempSrc: Oral  PainSc: 7          Complications: No apparent anesthesia complications

## 2016-12-23 NOTE — ED Notes (Signed)
PT AND WIFE UNABLE TO SIGN CONSENT.PT AND WIFE WERE UNAWARE OF ANY PENDING SURGERY OR PROCEDURE. PT AND WIFE INFORMED OF PENDING SURGERY. EXPLAINED SURGEON WOULD EXPLAIN PROCEDURE WITH DETAIL UPON ARRIVAL TO RECEIVING OR. ALLAN ( TRANSPORT PERSON ) WITNESSED CONVERSATION. ALLEN CALLED RECEIVING OR. CONVERSATION MADE AWARE TO RECEIVING OR TEAM. CHG BATH NOT PERFORMED. PT DECLINED AT TIME COFFERED. THIS RELATED TO LACK OF AWARENESS OF PENDING  PROCEDURE. ALLAN STATED THIS WOULD BE PERFORMED IN THE RECEIVING OR. PT AND WIFE UNDERSTOOD PROCESS AT THIS WRITER'S LEVEL OF AWARENESS. BELONGINGS GIVEN TO WIFE AT TIME OF TRANSFER.

## 2016-12-23 NOTE — Consult Note (Signed)
PULMONARY / CRITICAL CARE MEDICINE   Name: Austin Gould MRN: [id number] DOB: Oct 11, 1955    ADMISSION DATE:  12/23/2016 CONSULTATION DATE:  8/10  REFERRING MD:  [name]   CHIEF COMPLAINT:  Septic shock  HISTORY OF PRESENT ILLNESS:   This is a 61 year old male who presented to [facility] w/ cc: bilateral flank pain and fever (101). He had prior bilateral ureteral stents back in July. These were removed towards the end of July. Was doing well until 8/9 when he first noted fever and chills. In ER he also reported N/V. His WBC ct was 13.9. He had CT abd/pevlis which showed Right ureteral calculi and left Hydro. He was admitted by Urology. IVFs and abx were initiated. Went to the OR where they performed cystoscopy, and bilateral JJ stents. He was returned to PACU; remained hypotensive and neosyneprhrine dependent so PCCM asked to assist w/ care   PAST MEDICAL HISTORY :  He  has a past medical history of Bilateral ureteral calculi (10/04/2016); Diabetes mellitus; GERD (gastroesophageal reflux disease); History of kidney stones (10/04/2016); Hyperlipidemia; Hypertension; and Umbilical hernia.  PAST SURGICAL HISTORY: He  has a past surgical history that includes Extracorporeal shock wave lithotripsy (Left, 08/18/2011); Cystoscopy with ureteroscopy (10/19/2011); Cystoscopy w/ ureteral stent placement (Bilateral, 10/04/2016); Extracorporeal shock wave lithotripsy (Left, 10/17/2016); Cystoscopy with retrograde pyelogram, ureteroscopy and stent placement (Bilateral, 11/25/2016); and Holmium laser application (Bilateral, 11/25/2016).  Allergies  Allergen Reactions  . Ciprofloxacin Hives  . Sulfamethoxazole-Trimethoprim Hives    No current facility-administered medications on file prior to encounter.    Current Outpatient Prescriptions on File Prior to Encounter  Medication Sig  . aspirin EC 81 MG tablet Take 81 mg by mouth daily.  [name] Kitchen atorvastatin (LIPITOR) 40 MG tablet Take 40 mg by mouth every morning.   [name] Kitchen  esomeprazole (NEXIUM) 40 MG capsule Take 40 mg by mouth daily as needed (reflux).  . famotidine (PEPCID) 20 MG tablet Take 20 mg by mouth 2 (two) times daily as needed for heartburn. Heart burn   . GARLIC PO Take 1 capsule by mouth 2 (two) times daily.  . metFORMIN (GLUCOPHAGE) 1000 MG tablet Take 1,000 mg by mouth 2 (two) times daily with a meal.  . olmesartan (BENICAR) 40 MG tablet Take 40 mg by mouth every evening.  . Omega-3 Fatty Acids (FISH OIL) 1000 MG CPDR Take 1,000 mg by mouth 2 (two) times daily.  . promethazine (PHENERGAN) 25 MG tablet Take 1 tablet (25 mg total) by mouth every 6 (six) hours as needed for nausea or vomiting.  . Oxycodone HCl 10 MG TABS Take 1 tablet (10 mg total) by mouth every 4 (four) hours as needed. (Patient not taking: Reported on 12/23/2016)  . oxyCODONE-acetaminophen (ROXICET) 5-325 MG tablet Take 1-2 tablets by mouth every 6 (six) hours as needed for moderate pain or severe pain. After lithotripsy. (Patient not taking: Reported on 12/23/2016)  . phenazopyridine (PYRIDIUM) 200 MG tablet Take 1 tablet (200 mg total) by mouth 3 (three) times daily as needed for pain. (Patient not taking: Reported on 12/23/2016)  . senna-docusate (SENOKOT-S) 8.6-50 MG tablet Take 1 tablet by mouth at bedtime as needed for mild constipation. (Patient not taking: Reported on 12/23/2016)  . tamsulosin (FLOMAX) 0.4 MG CAPS capsule Take 1 capsule (0.4 mg total) by mouth daily. To help pass kidney stone fragments. (Patient not taking: Reported on 12/23/2016)    FAMILY HISTORY:  His has no family status information on file.    SOCIAL HISTORY:  He  reports that he has quit smoking. His smoking use included Cigarettes. He quit after 25.00 years of use. He has never used smokeless tobacco. He reports that he does not drink alcohol or use drugs.  REVIEW OF SYSTEMS:   REVIEW OF SYSTEMS:   All negative; except for those that are bolded, which indicate positives.  Constitutional: weight loss,  weight gain, night sweats, fevers, chills, fatigue, weakness.  HEENT: headaches, sore throat, sneezing, nasal congestion, post nasal drip, difficulty swallowing, tooth/dental problems, visual complaints, visual changes, ear aches. Neuro: difficulty with speech, weakness, numbness, ataxia. CV:  chest pain, orthopnea, PND, swelling in lower extremities, dizziness, palpitations, syncope.  Resp: cough, hemoptysis, dyspnea, wheezing. GI: heartburn, indigestion, abdominal pain, nausea, vomiting, diarrhea, constipation, change in bowel habits, loss of appetite, hematemesis, melena, hematochezia.  GU: dysuria, change in color of urine, urgency or frequency, flank pain, hematuria. MSK: joint pain or swelling, decreased range of motion. Psych: change in mood or affect, depression, anxiety, suicidal ideations, homicidal ideations. Skin: rash, itching, bruising.  SUBJECTIVE:    VITAL SIGNS: BP (!) 89/58   Pulse 92   Temp 99 F (37.2 C)   Resp 17   Ht 5\' 5"  (1.651 m)   Wt 185 lb (83.9 kg)   SpO2 95%   BMI 30.79 kg/m   HEMODYNAMICS:    VENTILATOR SETTINGS:    INTAKE / OUTPUT: I/O last 3 completed shifts: In: 3050 [IV Piggyback:3050] Out: -   PHYSICAL EXAMINATION: Gen:      No acute distress HEENT:  EOMI, sclera anicteric Neck:     No masses; no thyromegaly Lungs:    Clear to auscultation bilaterally; normal respiratory effort CV:         Regular rate and rhythm; no murmurs Abd:      + bowel sounds; soft, non-tender; no palpable masses, no distension Ext:    No edema; adequate peripheral perfusion Skin:      Warm and dry; no rash Neuro: alert and oriented x 3 Psych: normal mood and affect  LABS:  BMET  Recent Labs Lab 12/23/16 0108 12/23/16 1006  NA 135 138  K 3.9 4.4  CL 102 111  CO2 22 18*  BUN 28* 26*  CREATININE 2.78* 2.43*  GLUCOSE 186* 195*    Electrolytes  Recent Labs Lab 12/23/16 0108 12/23/16 1006  CALCIUM 9.1 7.1*    CBC  Recent Labs Lab  12/23/16 0108 12/23/16 1006  WBC 13.9* 9.9  HGB 10.8* 8.9*  HCT 33.5* 26.5*  PLT 291 216    Coag's No results for input(s): APTT, INR in the last 168 hours.  Sepsis Markers  Recent Labs Lab 12/23/16 0412  LATICACIDVEN 1.20    ABG No results for input(s): PHART, PCO2ART, PO2ART in the last 168 hours.  Liver Enzymes  Recent Labs Lab 12/23/16 0108  AST 18  ALT 17  ALKPHOS 43  BILITOT 0.5  ALBUMIN 4.3    Cardiac Enzymes No results for input(s): TROPONINI, PROBNP in the last 168 hours.  Glucose  Recent Labs Lab 12/23/16 0809 12/23/16 0931 12/23/16 1221  GLUCAP 136* 178* 210*    Imaging Dg C-arm 1-60 Min-no Report  Result Date: 12/23/2016 Fluoroscopy was utilized by the requesting physician.  No radiographic interpretation.   Ct Renal Stone Study  Result Date: 12/23/2016 CLINICAL DATA:  Acute onset of left flank pain, fever, nausea and vomiting. Leukocytosis. Initial encounter. EXAM: CT ABDOMEN AND PELVIS WITHOUT CONTRAST TECHNIQUE: Multidetector CT imaging of the abdomen and  pelvis was performed following the standard protocol without IV contrast. COMPARISON:  CT of the abdomen and pelvis performed 10/04/2016 FINDINGS: Lower chest: The visualized lung bases are grossly clear. The visualized portions of the mediastinum are unremarkable. Hepatobiliary: The liver is unremarkable in appearance. The gallbladder is unremarkable in appearance. The common bile duct remains normal in caliber. Pancreas: The pancreas is within normal limits. Spleen: The spleen is unremarkable in appearance. Adrenals/Urinary Tract: The adrenal glands are unremarkable in appearance. Moderate to severe left-sided hydronephrosis is noted, with left-sided perinephric stranding. There is distention of the left ureter along its entire course. No distal obstructing stone is seen. This may reflect a recently passed stone. Mild right-sided hydronephrosis is noted, reflecting an obstructing 8 x 5 mm stone  proximally at the right ureteropelvic junction. Scattered nonobstructing stones are noted within the left kidney, measuring up to 5 mm in size. Stomach/Bowel: The stomach is unremarkable in appearance. The small bowel is within normal limits. The appendix is not visualized; there is no evidence for appendicitis. The colon is unremarkable in appearance. Vascular/Lymphatic: Scattered calcification is seen along the abdominal aorta and its branches. The abdominal aorta is otherwise grossly unremarkable. The inferior vena cava is grossly unremarkable. No retroperitoneal lymphadenopathy is seen. No pelvic sidewall lymphadenopathy is identified. Reproductive: The bladder is mildly distended and grossly unremarkable. The prostate is enlarged, measuring 5.1 cm in transverse dimension. Other: A moderate umbilical hernia is noted, containing only fat. Musculoskeletal: No acute osseous abnormalities are identified. The visualized musculature is unremarkable in appearance. IMPRESSION: 1. Moderate to severe left-sided hydronephrosis. Distention of the left ureter along its entire course. No distal obstructing stone seen. This may reflect a recently passed stone. 2. Mild right-sided hydronephrosis, reflecting an obstructing 8 x 5 mm stone proximally at the right ureteropelvic junction. Would follow the patient's renal function closely, given bilateral hydronephrosis. 3. Scattered nonobstructing left renal stones measure up to 5 mm in size. 4. Scattered aortic atherosclerosis. 5. Enlarged prostate noted. 6. Moderate umbilical hernia, containing only fat. Electronically Signed   By: Roanna Raider M.D.   On: 12/23/2016 06:16     STUDIES:  CT (renal) 8/10: 1. Moderate to severe left-sided hydronephrosis. Distention of the left ureter along its entire course. No distal obstructing stone seen. This may reflect a recently passed stone. 2. Mild right-sided hydronephrosis, reflecting an obstructing 8 x 5 mm stone proximally at the  right ureteropelvic junction. Would follow the patient's renal function closely, given bilateral hydronephrosis. 3. Scattered nonobstructing left renal stones measure up to 5 mm in size. 4. Scattered aortic atherosclerosis. 5. Enlarged prostate noted.  6. Moderate umbilical hernia, containing only fat  CULTURES: BCX2 8/10>>> UC 8/10>>>  ANTIBIOTICS: vanc 8/10>>>8/10 Zosyn 8/10>>> 8/10 Rocephin 8/10>>>  SIGNIFICANT EVENTS: OR 8/10: 1 cystoscopy 2. bilateralretrograde pyelography 3.  Intraoperative fluoroscopy, under one hour, with interpretation 4.  bilateral 6 x 26 JJ stent placement  LINES/TUBES:  DISCUSSION: Septic shock UT source. Now s/p JJ stent. Remains on low dose neo. Anticipate that pressor needs should cont to improve.  - Give more fluids. 1 lt LR bolus - Follow CXR - Continue abx; f/u cultures - Cont IVFs. LR at 125cc/hr - Follow EKG and troponin.    ASSESSMENT / PLAN:  PULMONARY A: At risk for atelectasis  P:   Cont routine pulse ox Wean oxygen  CXR to assess post op atx and pulm edema   CARDIOVASCULAR A:  Severe sepsis, septic shock Presumably in setting of urinary tract  source. I&O showing 4.5 liters + since admit including IVFs recorded intra-op.   Remains on low dose neo.   P:  Cont IVFs Hold antihypertensives Send CEs, 12-lead, and cortisol, repeat LA If remains hypotensive will need central access but hopeful to wean of pressors soon.   RENAL A:   AKI in the setting of obstructive Uropathy (left hydro) due to ureteral calculi. Now s/p JJ stent by urology (8/10)--.anticipate that this will resolve NAGMA. Suspect that this is d/t NS resuscitation efforts  H/o BPH P:   Change MIVFs to LR Cont IVFs Repeat chemistry in am  Strict I&O Cont flomax  GASTROINTESTINAL A:   Nausea and vomiting ->improved H/o GERD P:   NPO Slow adv to clear liq PRN antiemetics  Cont PPI  HEMATOLOGIC A:   Anemia (normocytic/normochromic)  -has had hgb  drift almost 2 gms. Suspect that at least some of this is dilutional .  P:  Serial CBCs [place] heparin  Transfuse per protocol   INFECTIOUS A:   Severe sepsis UT source  P:   Vanc/zosyn started 8/10; will cont rocephin >>> F/u blood and urine cultures   ENDOCRINE A:   DM w/ hyperglycemia   P:   SSI protocol   NEUROLOGIC A:   No acute  P:   Supportive care   The patient is critically ill with multiple organ system failure and requires high complexity decision making for assessment and support, frequent evaluation and titration of therapies, advanced monitoring, review of radiographic studies and interpretation of complex data.   Critical Care Time devoted to patient care services, exclusive of separately billable procedures, described in this note is 35 minutes.   Chilton Greathouse MD [facility] Pager [phone] If no answer or after 3pm call: [phone] 12/23/2016, 3:34 PM

## 2016-12-23 NOTE — Progress Notes (Signed)
Pharmacy Antibiotic Note  Austin Gould is a 61 y.o. male admitted on 12/23/2016 with sepsis from urinary source.  Zosyn and Vancomycin ordered.  Pharmacy has been consulted for vancomycin dosing.  Patient has history of nephrolithiasis. Today's CT showed right kidney stone, left hydronephrosis. In OR currently for stent placement.  Vancomycin 1g IV x 1 given this morning.  AKI.   Plan: Vancomycin 1250 mg IV q24h. Daily SCr.  Height: 5\' 5"  (165.1 cm) Weight: 185 lb (83.9 kg) IBW/kg (Calculated) : 61.5  Temp (24hrs), Avg:100.7 F (38.2 C), Min:98.7 F (37.1 C), Max:103.1 F (39.5 C)   Recent Labs Lab 12/23/16 0108 12/23/16 0412 12/23/16 1006  WBC 13.9*  --  9.9  CREATININE 2.78*  --  2.43*  LATICACIDVEN  --  1.20  --     Estimated Creatinine Clearance: 32.6 mL/min (A) (by C-G formula based on SCr of 2.43 mg/dL (H)).    Allergies  Allergen Reactions  . Ciprofloxacin Hives  . Sulfamethoxazole-Trimethoprim Hives    Antimicrobials this admission: 8/10 Ceftriaxone x 1 preop 8/10 Vanc >> 9/10 Zosyn >>  Dose adjustments this admission:   Microbiology results: 8/10 BCx:  8/10 UCx:   Thank you for allowing pharmacy to be a part of this patient's care.  Clance BollRunyon, Lawson Isabell 12/23/2016 1:43 PM

## 2016-12-23 NOTE — Anesthesia Preprocedure Evaluation (Addendum)
Anesthesia Evaluation  Patient identified by MRN, date of birth, ID band Patient awake    Reviewed: Allergy & Precautions, NPO status , Patient's Chart, lab work & pertinent test results  Airway Mallampati: II  TM Distance: >3 FB Neck ROM: Full    Dental no notable dental hx. (+) Teeth Intact, Dental Advisory Given, Chipped,    Pulmonary former smoker,    Pulmonary exam normal breath sounds clear to auscultation       Cardiovascular hypertension, Pt. on medications Normal cardiovascular exam Rhythm:Regular Rate:Normal     Neuro/Psych negative neurological ROS  negative psych ROS   GI/Hepatic Neg liver ROS, GERD  Medicated and Controlled,  Endo/Other  diabetes, Well Controlled, Type 2, Oral Hypoglycemic AgentsObesity Hyperlipidemia  Renal/GU Renal diseaseBilateral ureteral calculi with hydronephrosis     Musculoskeletal negative musculoskeletal ROS (+)   Abdominal (+) + obese,   Peds  Hematology negative hematology ROS (+)   Anesthesia Other Findings   Reproductive/Obstetrics                             Anesthesia Physical  Anesthesia Plan  ASA: II and emergent  Anesthesia Plan: General   Post-op Pain Management:    Induction: Intravenous  PONV Risk Score and Plan: 4 or greater and Ondansetron, Dexamethasone, Propofol, Midazolam, Promethazine and Treatment may vary due to age or medical condition  Airway Management Planned: LMA  Additional Equipment:   Intra-op Plan:   Post-operative Plan: Extubation in OR  Informed Consent: I have reviewed the patients History and Physical, chart, labs and discussed the procedure including the risks, benefits and alternatives for the proposed anesthesia with the patient or authorized representative who has indicated his/her understanding and acceptance.   Dental advisory given  Plan Discussed with: CRNA  Anesthesia Plan Comments:         Anesthesia Quick Evaluation

## 2016-12-24 DIAGNOSIS — N132 Hydronephrosis with renal and ureteral calculous obstruction: Secondary | ICD-10-CM

## 2016-12-24 DIAGNOSIS — R7881 Bacteremia: Secondary | ICD-10-CM

## 2016-12-24 DIAGNOSIS — R6521 Severe sepsis with septic shock: Secondary | ICD-10-CM

## 2016-12-24 LAB — CBC
HEMATOCRIT: 28.1 % — AB (ref 39.0–52.0)
HEMOGLOBIN: 9 g/dL — AB (ref 13.0–17.0)
MCH: 26.7 pg (ref 26.0–34.0)
MCHC: 32 g/dL (ref 30.0–36.0)
MCV: 83.4 fL (ref 78.0–100.0)
Platelets: 181 10*3/uL (ref 150–400)
RBC: 3.37 MIL/uL — AB (ref 4.22–5.81)
RDW: 16.5 % — ABNORMAL HIGH (ref 11.5–15.5)
WBC: 13.4 10*3/uL — ABNORMAL HIGH (ref 4.0–10.5)

## 2016-12-24 LAB — BASIC METABOLIC PANEL
Anion gap: 9 (ref 5–15)
BUN: 21 mg/dL — ABNORMAL HIGH (ref 6–20)
CHLORIDE: 109 mmol/L (ref 101–111)
CO2: 22 mmol/L (ref 22–32)
CREATININE: 1.82 mg/dL — AB (ref 0.61–1.24)
Calcium: 7.9 mg/dL — ABNORMAL LOW (ref 8.9–10.3)
GFR calc non Af Amer: 39 mL/min — ABNORMAL LOW (ref >60)
GFR, EST AFRICAN AMERICAN: 45 mL/min — AB (ref >60)
Glucose, Bld: 160 mg/dL — ABNORMAL HIGH (ref 65–99)
POTASSIUM: 4.3 mmol/L (ref 3.5–5.1)
Sodium: 140 mmol/L (ref 135–145)

## 2016-12-24 LAB — TROPONIN I
Troponin I: 0.03 ng/mL (ref <0.03)
Troponin I: <0.03 ng/mL (ref <0.03)

## 2016-12-24 LAB — GLUCOSE, CAPILLARY
GLUCOSE-CAPILLARY: 138 mg/dL — AB (ref 65–99)
GLUCOSE-CAPILLARY: 221 mg/dL — AB (ref 65–99)
Glucose-Capillary: 211 mg/dL — ABNORMAL HIGH (ref 65–99)
Glucose-Capillary: 174 mg/dL — ABNORMAL HIGH (ref 65–99)

## 2016-12-24 MED ORDER — ACETAMINOPHEN 325 MG PO TABS
650.0000 mg | ORAL_TABLET | Freq: Four times a day (QID) | ORAL | Status: DC | PRN
  Administered 2016-12-24 – 2016-12-28 (×9): 650 mg via ORAL
  Filled 2016-12-24 (×9): qty 2

## 2016-12-24 MED ORDER — PIPERACILLIN-TAZOBACTAM 3.375 G IVPB 30 MIN
3.3750 g | Freq: Three times a day (TID) | INTRAVENOUS | Status: DC
  Administered 2016-12-24 – 2016-12-25 (×4): 3.375 g via INTRAVENOUS
  Filled 2016-12-24 (×8): qty 50

## 2016-12-24 NOTE — Progress Notes (Signed)
Pt has temp of 102.2. Notified Elink MD, given orders for PO Tylenol. RN will continue to monitor.

## 2016-12-24 NOTE — Progress Notes (Signed)
eLink Physician-Brief Progress Note Patient Name: Austin Gould DOB: 03/05/1956 MRN: [id number]   Date of Service  12/24/2016  HPI/Events of Note  Fever, able to take PO, AST and ALT normal  eICU Interventions  Tylenol 650 mg PO q6 hours PRN ordered for fever.     Intervention Category Major Interventions: Other:  [name] 12/24/2016, 4:01 AM

## 2016-12-24 NOTE — Progress Notes (Signed)
PULMONARY / CRITICAL CARE MEDICINE   Name: Austin Gould MRN: [id number] DOB: 04/06/1956    ADMISSION DATE:  12/23/2016 CONSULTATION DATE:  8/10  REFERRING MD:  Ronne BinningMckenzie   CHIEF COMPLAINT:  Septic shock  HISTORY OF PRESENT ILLNESS:   61 year old male who presented to [facility] w/ cc: bilateral flank pain and fever (101). He had prior bilateral ureteral stents back in July. These were removed towards the end of July. Was doing well until 8/9 when he first noted fever and chills. In ER he also reported N/V. His WBC ct was 13.9. He had CT abd/pevlis which showed Right ureteral calculi and left Hydro. He was admitted by Urology. IVFs and abx were initiated. Went to the OR where they performed cystoscopy, and bilateral JJ stents. He was returned to PACU; remained hypotensive and neosyneprhrine dependent so PCCM asked to assist w/ care   SUBJECTIVE:  feels better Would like to eat Off neo gtt   Vitals:   12/24/16 0740 12/24/16 0800  BP:  (!) 99/55  Pulse:  96  Resp:  (!) 21  Temp: 98.7 F (37.1 C)   SpO2:  96%      Intake/Output Summary (Last 24 hours) at 12/24/16 0914 Last data filed at 12/24/16 0800  Gross per 24 hour  Intake          5683.68 ml  Output             5575 ml  Net           108.68 ml    PHYSICAL EXAMINATION: Gen:      No acute distress HEENT:  EOMI, sclera anicteric Neck:     No masses; no thyromegaly Lungs:    Clear to auscultation bilaterally; normal respiratory effort CV:         Regular rate and rhythm; no murmurs Abd:      + bowel sounds; soft, non-tender; no palpable masses, no distension Ext:    No edema; adequate peripheral perfusion Skin:      Warm and dry; no rash Neuro: alert and oriented x 3 Psych: normal mood and affect  LABS:  BMET  Recent Labs Lab 12/23/16 0108 12/23/16 1006 12/24/16 0330  NA 135 138 140  K 3.9 4.4 4.3  CL 102 111 109  CO2 22 18* 22  BUN 28* 26* 21*  CREATININE 2.78* 2.43* 1.82*  GLUCOSE 186* 195* 160*     Electrolytes  Recent Labs Lab 12/23/16 0108 12/23/16 1006 12/24/16 0330  CALCIUM 9.1 7.1* 7.9*    CBC  Recent Labs Lab 12/23/16 0108 12/23/16 1006 12/24/16 0330  WBC 13.9* 9.9 13.4*  HGB 10.8* 8.9* 9.0*  HCT 33.5* 26.5* 28.1*  PLT 291 216 181    Coag's No results for input(s): APTT, INR in the last 168 hours.  Sepsis Markers  Recent Labs Lab 12/23/16 0412 12/23/16 1727  LATICACIDVEN 1.20 2.4*    ABG No results for input(s): PHART, PCO2ART, PO2ART in the last 168 hours.  Liver Enzymes  Recent Labs Lab 12/23/16 0108  AST 18  ALT 17  ALKPHOS 43  BILITOT 0.5  ALBUMIN 4.3    Cardiac Enzymes  Recent Labs Lab 12/23/16 1727 12/23/16 2341 12/24/16 0330  TROPONINI <0.03 <0.03 0.03*    Glucose  Recent Labs Lab 12/23/16 0809 12/23/16 0931 12/23/16 1221 12/23/16 1644 12/23/16 2118 12/24/16 0728  GLUCAP 136* 178* 210* 279* 341* 138*    Imaging Dg Chest Port 1 View  Result Date:  12/23/2016 CLINICAL DATA:  Postoperative fever.  Atelectasis. EXAM: PORTABLE CHEST 1 VIEW COMPARISON:  Abdominal CT from earlier today FINDINGS: Normal heart size and mediastinal contours for technique. There is no edema, consolidation, effusion, or pneumothorax. No acute osseous finding. IMPRESSION: Negative portable chest. Electronically Signed   By: Marnee Spring M.D.   On: 12/23/2016 15:56   Dg C-arm 1-60 Min-no Report  Result Date: 12/23/2016 Fluoroscopy was utilized by the requesting physician.  No radiographic interpretation.     STUDIES:  CT (renal) 8/10: 1. Moderate to severe left-sided hydronephrosis. Distention of the left ureter along its entire course. No distal obstructing stone seen. This may reflect a recently passed stone. 2. Mild right-sided hydronephrosis, reflecting an obstructing 8 x 5 mm stone proximally at the right ureteropelvic junction. Would follow the patient's renal function closely, given bilateral hydronephrosis. 3. Scattered  nonobstructing left renal stones measure up to 5 mm in size. 4. Scattered aortic atherosclerosis. 5. Enlarged prostate noted.  6. Moderate umbilical hernia, containing only fat  CULTURES: BCX2 8/10>>> e coli >> UC 8/10>>>  ANTIBIOTICS: vanc 8/10>>>8/10 Zosyn 8/10>>> 8/10 Rocephin 8/10>>>  SIGNIFICANT EVENTS: OR 8/10: 1 cystoscopy 2. bilateralretrograde pyelography 3.  Intraoperative fluoroscopy, under one hour, with interpretation 4.  bilateral 6 x 26 JJ stent placement  LINES/TUBES:  DISCUSSION: Septic shock UT source. Now s/p JJ stent. -resolved   ASSESSMENT / PLAN:   CARDIOVASCULAR A:  Septic shock Relative adrenal insufficiency P:  Hold antihypertensives Cortisol relatively low   RENAL A:   AKI in the setting of obstructive Uropathy (left hydro) due to ureteral calculi. Now s/p JJ stent by urology (8/10)--resolving H/o BPH P:   Cont IVFs Repeat chemistry in am  Strict I&O Cont flomax  GASTROINTESTINAL A:   Nausea and vomiting ->improved H/o GERD P:   CHO modified   INFECTIOUS A:   Severe sepsis UT source  P:   Vanc/zosyn started 8/10; will cont rocephin >>> F/u sens of e coli Will need at least 14ds of ABx given bacteremia  ENDOCRINE A:   DM w/ hyperglycemia   P:   SSI protocol   PCCM available as needed  Oretha Milch MD  Cyril Mourning MD. FCCP. [facility] Pager [phone] If no response call [phone]   12/24/2016

## 2016-12-24 NOTE — Progress Notes (Signed)
CRITICAL VALUE ALERT  Critical Value:  Troponin 0.03  Date & Time Notied:  12-24-16 @ 0430  Provider Notified, no orders given at this time

## 2016-12-24 NOTE — Progress Notes (Signed)
Urology Progress Note   1 Day Post-Op  61 yo M w/ hx nephrolithiasis who presented with fevers, flank pain bilaterally. CT showed 6mm right proximal ureteral stone and left hydronephrosis to the UPJ. She is s/p bilateral URS 7/13. He is now s/p bilateral ureteral stent placement 8/10  Subjective: Patient required pressors post operatively, now weaned. Febrile overnight, with borderline low blood pressures and mild tachycardia which has now resolved. Made 6L urine, foley catheter in place, draining dark urine. Cr 2.78 on admission, improved to 1.82 this AM after stent placement and hydration. Blood cultures x2 growing E coli and Enterobacter, sensitivities pending.   Did well overnight, feels better this AM. Foley catheter not bothersome.    Previous urine culture from urology office: 11/29/16  Antimicrobial Susceptibility and Organism Identification Report FINAL   Source : URINE CULTURE Iso/Result : 01 >100,000 C/ML Escherichia coli Antimicrobic/Dose MIC SYSTEMIC URINE --------------------------------------------------------------- Amp/Sulbactam 16/8 I I Amikacin <16 S S Ampicillin >16 R R Amox/K Clav >16/8 R R Aztreonam <8 S S Ceftriaxone <8 S S Ceftazidime <1 S S Cephalothin >16 R Cefotaxime <2 S S Cefoxitin <8 S S Cefazolin >16 R R Ciprofloxacin <1 S S Cefepime <8 S S Cefuroxime 8 S S Ertapenem <1 S S Nitrofurantoin <32 S Gentamicin <4 S S Imipenem <4 S S Levofloxacin <2 S S Meropenem <4 S S Pip/Tazo <16 S S Trimeth/Sulfa <2/38 S S Tetracycline >8 R R Tobramycin <4 S S  Objective: Vital signs in last 24 hours: Temp:  [97.5 F (36.4 C)-102.5 F (39.2 C)] 98.7 F (37.1 C) (08/11 0740) Pulse Rate:  [76-125] 95 (08/11 0900) Resp:  [10-28] 19 (08/11 0900) BP: (86-148)/(43-81) 112/57 (08/11 0900) SpO2:  [92 %-100 %] 97 % (08/11 0900)  Intake/Output from previous day: 08/10 0701 - 08/11 0700 In: 5683.7 [P.O.:840; I.V.:3960.4; IV Piggyback:883.3] Out: 6060  [Urine:6050; Blood:10] Intake/Output this shift: Total I/O In: 675 [I.V.:625; IV Piggyback:50] Out: 525 [Urine:525]  Physical Exam:  General: Alert and oriented CV: RRR Lungs: Clear Abdomen: Soft, non distended GU: Foley with dark yellow urine in tubing  Ext: NT, No erythema  Lab Results:  Recent Labs  12/23/16 0108 12/23/16 1006 12/24/16 0330  HGB 10.8* 8.9* 9.0*  HCT 33.5* 26.5* 28.1*   BMET  Recent Labs  12/23/16 1006 12/24/16 0330  NA 138 140  K 4.4 4.3  CL 111 109  CO2 18* 22  GLUCOSE 195* 160*  BUN 26* 21*  CREATININE 2.43* 1.82*  CALCIUM 7.1* 7.9*     Studies/Results: Dg Chest Port 1 View  Result Date: 12/23/2016 CLINICAL DATA:  Postoperative fever.  Atelectasis. EXAM: PORTABLE CHEST 1 VIEW COMPARISON:  Abdominal CT from earlier today FINDINGS: Normal heart size and mediastinal contours for technique. There is no edema, consolidation, effusion, or pneumothorax. No acute osseous finding. IMPRESSION: Negative portable chest. Electronically Signed   By: Marnee Spring M.D.   On: 12/23/2016 15:56   Dg C-arm 1-60 Min-no Report  Result Date: 12/23/2016 Fluoroscopy was utilized by the requesting physician.  No radiographic interpretation.   Ct Renal Stone Study  Result Date: 12/23/2016 CLINICAL DATA:  Acute onset of left flank pain, fever, nausea and vomiting. Leukocytosis. Initial encounter. EXAM: CT ABDOMEN AND PELVIS WITHOUT CONTRAST TECHNIQUE: Multidetector CT imaging of the abdomen and pelvis was performed following the standard protocol without IV contrast. COMPARISON:  CT of the abdomen and pelvis performed 10/04/2016 FINDINGS: Lower chest: The visualized lung bases are grossly clear. The visualized portions of the  mediastinum are unremarkable. Hepatobiliary: The liver is unremarkable in appearance. The gallbladder is unremarkable in appearance. The common bile duct remains normal in caliber. Pancreas: The pancreas is within normal limits. Spleen: The  spleen is unremarkable in appearance. Adrenals/Urinary Tract: The adrenal glands are unremarkable in appearance. Moderate to severe left-sided hydronephrosis is noted, with left-sided perinephric stranding. There is distention of the left ureter along its entire course. No distal obstructing stone is seen. This may reflect a recently passed stone. Mild right-sided hydronephrosis is noted, reflecting an obstructing 8 x 5 mm stone proximally at the right ureteropelvic junction. Scattered nonobstructing stones are noted within the left kidney, measuring up to 5 mm in size. Stomach/Bowel: The stomach is unremarkable in appearance. The small bowel is within normal limits. The appendix is not visualized; there is no evidence for appendicitis. The colon is unremarkable in appearance. Vascular/Lymphatic: Scattered calcification is seen along the abdominal aorta and its branches. The abdominal aorta is otherwise grossly unremarkable. The inferior vena cava is grossly unremarkable. No retroperitoneal lymphadenopathy is seen. No pelvic sidewall lymphadenopathy is identified. Reproductive: The bladder is mildly distended and grossly unremarkable. The prostate is enlarged, measuring 5.1 cm in transverse dimension. Other: A moderate umbilical hernia is noted, containing only fat. Musculoskeletal: No acute osseous abnormalities are identified. The visualized musculature is unremarkable in appearance. IMPRESSION: 1. Moderate to severe left-sided hydronephrosis. Distention of the left ureter along its entire course. No distal obstructing stone seen. This may reflect a recently passed stone. 2. Mild right-sided hydronephrosis, reflecting an obstructing 8 x 5 mm stone proximally at the right ureteropelvic junction. Would follow the patient's renal function closely, given bilateral hydronephrosis. 3. Scattered nonobstructing left renal stones measure up to 5 mm in size. 4. Scattered aortic atherosclerosis. 5. Enlarged prostate noted.  6. Moderate umbilical hernia, containing only fat. Electronically Signed   By: Roanna RaiderJeffery  Chang M.D.   On: 12/23/2016 06:16    Assessment/Plan:  61 yo M w/ hx nephrolithiasis who presented with fevers, flank pain bilaterally after bilateral URS 7/13. CT showed 6mm right proximal ureteral stone and left hydronephrosis to the UPJ. He is now s/p bilateral ureteral stent placement 8/10, perioperative course complicated by urosepsis and AKI. Slowly improving.   - continue foley catheter to drainage until patient afebrile for 24 hours - continue flomax  - changed antibiotics to zosyn given enterobacter and E coli growth - await sensitivities to narrow antibioitcs - repeat blood cultures tomorrow to determine treatment course length  - regular diet, continue IVF - ambulate patient TID, wean O2   Dispo: ICU today, if BP stable off pressors will transfer to floor tomorrow   LOS: 1 day   Lavana Huckeba L 12/24/2016, 9:44 AM

## 2016-12-25 LAB — CULTURE, BLOOD (ROUTINE X 2)
SPECIAL REQUESTS: ADEQUATE
Special Requests: ADEQUATE

## 2016-12-25 LAB — URINE CULTURE: Culture: >=100000 — AB

## 2016-12-25 LAB — GLUCOSE, CAPILLARY
GLUCOSE-CAPILLARY: 149 mg/dL — AB (ref 65–99)
GLUCOSE-CAPILLARY: 175 mg/dL — AB (ref 65–99)
GLUCOSE-CAPILLARY: 172 mg/dL — AB (ref 65–99)
GLUCOSE-CAPILLARY: 141 mg/dL — AB (ref 65–99)

## 2016-12-25 MED ORDER — DEXTROSE 5 % IV SOLN
2.0000 g | INTRAVENOUS | Status: DC
  Administered 2016-12-25 – 2016-12-27 (×3): 2 g via INTRAVENOUS
  Filled 2016-12-25 (×3): qty 2

## 2016-12-25 MED ORDER — BISACODYL 10 MG RE SUPP: 10.0000 mg | Freq: Every day | RECTAL | Status: DC | PRN

## 2016-12-25 MED ORDER — BELLADONNA ALKALOIDS-OPIUM 16.2-60 MG RE SUPP: 1.0000 | Freq: Four times a day (QID) | RECTAL | Status: DC | PRN

## 2016-12-25 MED ORDER — DOCUSATE SODIUM 100 MG PO CAPS
100.0000 mg | ORAL_CAPSULE | Freq: Two times a day (BID) | ORAL | Status: DC
  Administered 2016-12-25 – 2016-12-28 (×7): 100 mg via ORAL
  Filled 2016-12-25 (×6): qty 1

## 2016-12-25 MED ORDER — SENNA 8.6 MG PO TABS
1.0000 | ORAL_TABLET | Freq: Every day | ORAL | Status: DC
  Administered 2016-12-25 – 2016-12-28 (×4): 8.6 mg via ORAL
  Filled 2016-12-25 (×3): qty 1

## 2016-12-25 NOTE — Progress Notes (Signed)
Pt recd from ICU condition stable. No change in initial am assessment. Wife and pt oriented to room and unit. Cont with current plan of care

## 2016-12-25 NOTE — Progress Notes (Signed)
Urology Progress Note   2 Days Post-Op  61 yo M w/ hx nephrolithiasis who presented with fevers, flank pain bilaterally. CT showed 6mm right proximal ureteral stone and left hydronephrosis to the UPJ. He is s/p bilateral URS 7/13. He is now s/p bilateral ureteral stent placement 8/10  Subjective: Doing much better this AM. Afebrile for last 24 hours on zosyn. No need for boluses or pressors to maintain blood pressure. Still ICU status. Foley catheter draining lighter urine, 4.5L over last 24 hours. BP wnl, mild tachycardia throughout evening. Blood cultures x2 growing E coli sensitivities resulted: resistant to cefazolin, augmentin, ampicillin.   Continues to feel better.   Previous urine culture from urology office: 11/29/16  Antimicrobial Susceptibility and Organism Identification Report FINAL   Source : URINE CULTURE Iso/Result : 01 >100,000 C/ML Escherichia coli Antimicrobic/Dose MIC SYSTEMIC URINE --------------------------------------------------------------- Amp/Sulbactam 16/8 I I Amikacin <16 S S Ampicillin >16 R R Amox/K Clav >16/8 R R Aztreonam <8 S S Ceftriaxone <8 S S Ceftazidime <1 S S Cephalothin >16 R Cefotaxime <2 S S Cefoxitin <8 S S Cefazolin >16 R R Ciprofloxacin <1 S S Cefepime <8 S S Cefuroxime 8 S S Ertapenem <1 S S Nitrofurantoin <32 S Gentamicin <4 S S Imipenem <4 S S Levofloxacin <2 S S Meropenem <4 S S Pip/Tazo <16 S S Trimeth/Sulfa <2/38 S S Tetracycline >8 R R Tobramycin <4 S S  Objective: Vital signs in last 24 hours: Temp:  [98.6 F (37 C)-100.2 F (37.9 C)] 100.2 F (37.9 C) (08/12 0400) Pulse Rate:  [89-135] 103 (08/12 0900) Resp:  [16-30] 23 (08/12 0900) BP: (105-152)/(52-81) 151/77 (08/12 0900) SpO2:  [93 %-100 %] 96 % (08/12 0900)  Intake/Output from previous day: 08/11 0701 - 08/12 0700 In: 3450 [I.V.:3250; IV Piggyback:200] Out: 4550 [Urine:4550] Intake/Output this shift: Total I/O In: 50 [IV Piggyback:50] Out: 625  [Urine:625]  Physical Exam:  General: Alert and oriented CV: RRR Lungs: Clear Abdomen: Soft, non distended GU: Foley with clear light yellow urine in tubing  Ext: NT, No erythema  Lab Results:  Recent Labs  12/23/16 0108 12/23/16 1006 12/24/16 0330  HGB 10.8* 8.9* 9.0*  HCT 33.5* 26.5* 28.1*   BMET  Recent Labs  12/23/16 1006 12/24/16 0330  NA 138 140  K 4.4 4.3  CL 111 109  CO2 18* 22  GLUCOSE 195* 160*  BUN 26* 21*  CREATININE 2.43* 1.82*  CALCIUM 7.1* 7.9*     Studies/Results: Dg Chest Port 1 View  Result Date: 12/23/2016 CLINICAL DATA:  Postoperative fever.  Atelectasis. EXAM: PORTABLE CHEST 1 VIEW COMPARISON:  Abdominal CT from earlier today FINDINGS: Normal heart size and mediastinal contours for technique. There is no edema, consolidation, effusion, or pneumothorax. No acute osseous finding. IMPRESSION: Negative portable chest. Electronically Signed   By: Marnee Spring M.D.   On: 12/23/2016 15:56    Assessment/Plan:  61 yo M w/ hx nephrolithiasis who presented with fevers, flank pain bilaterally after bilateral URS 7/13. CT showed 6mm right proximal ureteral stone and left hydronephrosis to the UPJ. He is now s/p bilateral ureteral stent placement 8/10, perioperative course complicated by urosepsis and AKI. Improving on zosyn, blood cultures with Enterobacter and E coli.   - d/c foley catheter, TOV - continue flomax  - narrow antibiotics to ceftriaxone, will d/c on cefpodoxime to complete 14 day course  - regular diet, medlock - ambulate patient TID, wean O2 - floor status - labs tomorrow   Dispo: Floor today, if  continues to be afebrile likely d/c tomorrow    LOS: 2 days   Ngan Qualls L 12/25/2016, 10:02 AM

## 2016-12-26 LAB — BASIC METABOLIC PANEL
ANION GAP: 8 (ref 5–15)
BUN: 11 mg/dL (ref 6–20)
CALCIUM: 8.4 mg/dL — AB (ref 8.9–10.3)
CO2: 25 mmol/L (ref 22–32)
CREATININE: 1.41 mg/dL — AB (ref 0.61–1.24)
Chloride: 106 mmol/L (ref 101–111)
GFR calc Af Amer: >60 mL/min (ref >60)
GFR calc non Af Amer: 53 mL/min — ABNORMAL LOW (ref >60)
GLUCOSE: 146 mg/dL — AB (ref 65–99)
Potassium: 3.7 mmol/L (ref 3.5–5.1)
Sodium: 139 mmol/L (ref 135–145)

## 2016-12-26 LAB — CBC
HEMATOCRIT: 25.2 % — AB (ref 39.0–52.0)
Hemoglobin: 8.3 g/dL — ABNORMAL LOW (ref 13.0–17.0)
MCH: 26.7 pg (ref 26.0–34.0)
MCHC: 32.9 g/dL (ref 30.0–36.0)
MCV: 81 fL (ref 78.0–100.0)
Platelets: 194 10*3/uL (ref 150–400)
RBC: 3.11 MIL/uL — ABNORMAL LOW (ref 4.22–5.81)
RDW: 16.2 % — AB (ref 11.5–15.5)
WBC: 8.1 10*3/uL (ref 4.0–10.5)

## 2016-12-26 LAB — GLUCOSE, CAPILLARY
GLUCOSE-CAPILLARY: 160 mg/dL — AB (ref 65–99)
Glucose-Capillary: 143 mg/dL — ABNORMAL HIGH (ref 65–99)
Glucose-Capillary: 157 mg/dL — ABNORMAL HIGH (ref 65–99)
Glucose-Capillary: 162 mg/dL — ABNORMAL HIGH (ref 65–99)

## 2016-12-26 NOTE — Progress Notes (Signed)
Patient ID: Austin Gould, male   DOB: April 01, 1956, 61 y.o.   MRN: [id number] 3 Days Post-Op  Assessment: Right ureteral stone and left hydronephrosis - his urine and blood cultures have grown E. coli.  The E. coli was sensitive to ceftriaxone which he remains on.  He is having spiking fevers that are decreasing daily with the highest being 100.6 over the past 24 hours.  He has bilateral stents and we discussed today the need to leave those indwelling, treat him with a long course of antibiotics and then proceed with removal of his right ureteral stone as well as potentially his left double-J stent.  Plan:1.  Continue ceftriaxone. 2.  As long as he remains afebrile he could be switched to an oral agent however he is allergic to Cipro and Septra and sensitive to nitrofurantoin which does not achieve adequate tissue levels.   Subjective: Patient is without complaint.  Objective: Vital signs in last 24 hours: Temp:  [98.6 F (37 C)-100.6 F (38.1 C)] 100.1 F (37.8 C) (08/13 1354) Pulse Rate:  [82-126] 82 (08/13 1354) Resp:  [18-20] 18 (08/13 1354) BP: (135-164)/(72-86) 135/72 (08/13 1354) SpO2:  [89 %-98 %] 98 % (08/13 0553)  Intake/Output from previous day: 08/12 0701 - 08/13 0700 In: 3340 [P.O.:240; I.V.:3000; IV Piggyback:100] Out: 4025 [Urine:4025] Intake/Output this shift: Total I/O In: -  Out: 1400 [Urine:1400]  Past Medical History:  Diagnosis Date  . Bilateral ureteral calculi 10/04/2016  . Diabetes mellitus    type 2  . GERD (gastroesophageal reflux disease)    PRN MEDS as need  . History of kidney stones 10/04/2016   bil. per CT  . Hyperlipidemia   . Hypertension   . Umbilical hernia    Current Facility-Administered Medications  Medication Dose Route Frequency Provider Last Rate Last Dose  . acetaminophen (TYLENOL) tablet 650 mg  650 mg Oral Q6H PRN Alyson Reedy, MD   650 mg at 12/26/16 1621  . aspirin EC tablet 81 mg  81 mg Oral Daily Malen Gauze, MD    81 mg at 12/26/16 0865  . atorvastatin (LIPITOR) tablet 40 mg  40 mg Oral q1800 Malen Gauze, MD   40 mg at 12/26/16 1730  . bisacodyl (DULCOLAX) suppository 10 mg  10 mg Rectal Daily PRN Filippou, Marzetta Board, MD      . cefTRIAXone (ROCEPHIN) 2 g in dextrose 5 % 50 mL IVPB  2 g Intravenous Q24H Filippou, Marzetta Board, MD   Stopped at 12/26/16 1633  . docusate sodium (COLACE) capsule 100 mg  100 mg Oral BID Filippou, Marzetta Board, MD   100 mg at 12/26/16 7846  . HYDROmorphone (DILAUDID) injection 0.5-1 mg  0.5-1 mg Intravenous Q2H PRN Malen Gauze, MD   1 mg at 12/24/16 2036  . insulin aspart (novoLOG) injection 0-15 Units  0-15 Units Subcutaneous TID WC McKenzie, Mardene Celeste, MD   3 Units at 12/26/16 1730  . insulin aspart (novoLOG) injection 0-5 Units  0-5 Units Subcutaneous QHS Malen Gauze, MD   4 Units at 12/23/16 2136  . lactated ringers infusion   Intravenous Continuous Malen Gauze, MD 50 mL/hr at 12/26/16 1627    . ondansetron (ZOFRAN) injection 4 mg  4 mg Intravenous Q4H PRN McKenzie, Mardene Celeste, MD      . opium-belladonna (B&O SUPPRETTES) 16.2-60 MG suppository 1 suppository  1 suppository Rectal Q6H PRN [name] Mardene Celeste, MD      . oxyCODONE (Oxy IR/ROXICODONE) immediate  release tablet 5 mg  5 mg Oral Q4H PRN Malen Gauze, MD   5 mg at 12/25/16 1317  . pantoprazole (PROTONIX) EC tablet 40 mg  40 mg Oral Daily Malen Gauze, MD   40 mg at 12/26/16 4098  . saccharomyces boulardii (FLORASTOR) capsule 500 mg  500 mg Oral BID Malen Gauze, MD   500 mg at 12/26/16 0926  . senna (SENOKOT) tablet 8.6 mg  1 tablet Oral Daily Filippou, Marzetta Board, MD   8.6 mg at 12/26/16 0927  . tamsulosin (FLOMAX) capsule 0.4 mg  0.4 mg Oral Daily Malen Gauze, MD   0.4 mg at 12/26/16 1191  . zolpidem (AMBIEN) tablet 5 mg  5 mg Oral QHS PRN,MR X 1 McKenzie, Mardene Celeste, MD   5 mg at 12/25/16 2052    Physical Exam:  General: Patient is in no apparent distress Lungs:  Normal respiratory effort, chest expands symmetrically. GI: The abdomen is soft and nontender without mass.    Lab Results:  Recent Labs  12/24/16 0330 12/26/16 0607  WBC 13.4* 8.1  HGB 9.0* 8.3*  HCT 28.1* 25.2*   BMET  Recent Labs  12/24/16 0330 12/26/16 0607  NA 140 139  K 4.3 3.7  CL 109 106  CO2 22 25  GLUCOSE 160* 146*  BUN 21* 11  CREATININE 1.82* 1.41*  CALCIUM 7.9* 8.4*   No results for input(s): LABPT, INR in the last 72 hours. No results for input(s): LABURIN in the last 72 hours. Results for orders placed or performed during the hospital encounter of 12/23/16  Urine C&S     Status: Abnormal   Collection Time: 12/23/16  1:15 AM  Result Value Ref Range Status   Specimen Description URINE, CLEAN CATCH  Final   Special Requests NONE  Final   Culture >=100,000 COLONIES/mL ESCHERICHIA COLI (A)  Final   Report Status 12/25/2016 FINAL  Final   Organism ID, Bacteria ESCHERICHIA COLI (A)  Final      Susceptibility   Escherichia coli - MIC*    AMPICILLIN >=32 RESISTANT Resistant     CEFAZOLIN >=64 RESISTANT Resistant     CEFTRIAXONE <=1 SENSITIVE Sensitive     CIPROFLOXACIN <=0.25 SENSITIVE Sensitive     GENTAMICIN <=1 SENSITIVE Sensitive     IMIPENEM <=0.25 SENSITIVE Sensitive     NITROFURANTOIN <=16 SENSITIVE Sensitive     TRIMETH/SULFA <=20 SENSITIVE Sensitive     AMPICILLIN/SULBACTAM >=32 RESISTANT Resistant     PIP/TAZO 8 SENSITIVE Sensitive     Extended ESBL NEGATIVE Sensitive     * >=100,000 COLONIES/mL ESCHERICHIA COLI  Blood culture (routine x 2)     Status: Abnormal   Collection Time: 12/23/16  3:58 AM  Result Value Ref Range Status   Specimen Description BLOOD BLOOD LEFT FOREARM  Final   Special Requests   Final    BOTTLES DRAWN AEROBIC AND ANAEROBIC Blood Culture adequate volume   Culture  Setup Time   Final    GRAM NEGATIVE RODS IN BOTH AEROBIC AND ANAEROBIC BOTTLES CRITICAL RESULT CALLED TO, READ BACK BY AND VERIFIED WITH: D ZEIGLER  PHARMD 2133 12/23/16 A BROWNING    Culture (A)  Final    ESCHERICHIA COLI SUSCEPTIBILITIES PERFORMED ON PREVIOUS CULTURE WITHIN THE LAST 5 DAYS. Performed at [facility], [address] [place], [place] [address]    Report Status 12/25/2016 FINAL  Final  Blood culture (routine x 2)     Status: Abnormal  Collection Time: 12/23/16  3:59 AM  Result Value Ref Range Status   Specimen Description BLOOD BLOOD RIGHT HAND  Final   Special Requests   Final    BOTTLES DRAWN AEROBIC AND ANAEROBIC Blood Culture adequate volume   Culture  Setup Time   Final    GRAM NEGATIVE RODS ANAEROBIC BOTTLE ONLY Organism ID to follow CRITICAL RESULT CALLED TO, READ BACK BY AND VERIFIED WITHHurshel Party: D [facility]IGLER [facility] 12/23/16 A BROWNING Performed at [facility], [address] [place], [place] [address]    Culture ESCHERICHIA COLI (A)  Final   Report Status 12/25/2016 FINAL  Final   Organism ID, Bacteria ESCHERICHIA COLI  Final      Susceptibility   Escherichia coli - MIC*    AMPICILLIN >=32 RESISTANT Resistant     CEFAZOLIN >=64 RESISTANT Resistant     CEFEPIME <=1 SENSITIVE Sensitive     CEFTAZIDIME <=1 SENSITIVE Sensitive     CEFTRIAXONE <=1 SENSITIVE Sensitive     CIPROFLOXACIN <=0.25 SENSITIVE Sensitive     GENTAMICIN <=1 SENSITIVE Sensitive     IMIPENEM <=0.25 SENSITIVE Sensitive     TRIMETH/SULFA <=20 SENSITIVE Sensitive     AMPICILLIN/SULBACTAM >=32 RESISTANT Resistant     PIP/TAZO <=4 SENSITIVE Sensitive     Extended ESBL NEGATIVE Sensitive     * ESCHERICHIA COLI  Blood Culture ID Panel (Reflexed)     Status: Abnormal   Collection Time: 12/23/16  3:59 AM  Result Value Ref Range Status   Enterococcus species NOT DETECTED NOT DETECTED Final   Vancomycin resistance NOT DETECTED NOT DETECTED Final   Listeria monocytogenes NOT DETECTED NOT DETECTED Final   Staphylococcus species NOT DETECTED NOT DETECTED Final   Staphylococcus aureus NOT DETECTED NOT DETECTED Final   Methicillin  resistance NOT DETECTED NOT DETECTED Final   Streptococcus species NOT DETECTED NOT DETECTED Final   Streptococcus agalactiae NOT DETECTED NOT DETECTED Final   Streptococcus pneumoniae NOT DETECTED NOT DETECTED Final   Streptococcus pyogenes NOT DETECTED NOT DETECTED Final   Acinetobacter baumannii NOT DETECTED NOT DETECTED Final   Enterobacteriaceae species DETECTED (A) NOT DETECTED Final    Comment: CRITICAL RESULT CALLED TO, READ BACK BY AND VERIFIED WITH: D ZEIGLER PHARMD 2133 12/23/16 A BROWNING    Enterobacter cloacae complex NOT DETECTED NOT DETECTED Final   Escherichia coli DETECTED (A) NOT DETECTED Final    Comment: CRITICAL RESULT CALLED TO, READ BACK BY AND VERIFIED WITH: D ZEIGLER PHARMD 2133 12/23/16 A BROWNING    Klebsiella oxytoca NOT DETECTED NOT DETECTED Final   Klebsiella pneumoniae NOT DETECTED NOT DETECTED Final   Proteus species NOT DETECTED NOT DETECTED Final   Serratia marcescens NOT DETECTED NOT DETECTED Final   Carbapenem resistance NOT DETECTED NOT DETECTED Final   Haemophilus influenzae NOT DETECTED NOT DETECTED Final   Neisseria meningitidis NOT DETECTED NOT DETECTED Final   Pseudomonas aeruginosa NOT DETECTED NOT DETECTED Final   Candida albicans NOT DETECTED NOT DETECTED Final   Candida glabrata NOT DETECTED NOT DETECTED Final   Candida krusei NOT DETECTED NOT DETECTED Final   Candida parapsilosis NOT DETECTED NOT DETECTED Final   Candida tropicalis NOT DETECTED NOT DETECTED Final    Comment: Performed at [facility], [address] [place], [place] [address]  MRSA PCR Screening     Status: None   Collection Time: 12/23/16  5:04 PM  Result Value Ref Range Status   MRSA by PCR NEGATIVE NEGATIVE Final  Comment:        The GeneXpert MRSA Assay (FDA approved for NASAL specimens only), is one component of a comprehensive MRSA colonization surveillance program. It is not intended to diagnose MRSA infection nor to guide or monitor treatment  for MRSA infections.     Studies/Results: No results found.     [place] 12/26/2016, 5:36 PM

## 2016-12-26 NOTE — Progress Notes (Signed)
When rechecking patient's temp and HR, O2 Sats only 88% RA. Pt denies SOB or difficulty breathing. No signs of distress. O2 Sats improved to 9 on 2L/[place]. Pt's abdomen distended but does have active BS. Pt states he had a BM 3 days ago but is passing gas. PRN Tylenol given for temp 06 100.6. Will recheck VS shortly.

## 2016-12-26 NOTE — Progress Notes (Signed)
3 Days Post-Op Subjective: Patient reports improvement in left flank pain. Fever 101.3 overnight. Blood cultures positive for enterobactor and e coli  Objective: Vital signs in last 24 hours: Temp:  [98.6 F (37 C)-100.6 F (38.1 C)] 100.1 F (37.8 C) (08/13 1354) Pulse Rate:  [82-126] 82 (08/13 1354) Resp:  [18-20] 18 (08/13 1354) BP: (135-164)/(72-86) 135/72 (08/13 1354) SpO2:  [89 %-98 %] 98 % (08/13 0553)  Intake/Output from previous day: 08/12 0701 - 08/13 0700 In: 3340 [P.O.:240; I.V.:3000; IV Piggyback:100] Out: 4025 [Urine:4025] Intake/Output this shift: Total I/O In: -  Out: 1400 [Urine:1400]  Physical Exam:  General:alert, cooperative and appears stated age GI: soft, non tender, normal bowel sounds, no palpable masses, no organomegaly, no inguinal hernia Male genitalia: not done Extremities: extremities normal, atraumatic, no cyanosis or edema  Lab Results:  Recent Labs  12/24/16 0330 12/26/16 0607  HGB 9.0* 8.3*  HCT 28.1* 25.2*   BMET  Recent Labs  12/24/16 0330 12/26/16 0607  NA 140 139  K 4.3 3.7  CL 109 106  CO2 22 25  GLUCOSE 160* 146*  BUN 21* 11  CREATININE 1.82* 1.41*  CALCIUM 7.9* 8.4*   No results for input(s): LABPT, INR in the last 72 hours. No results for input(s): LABURIN in the last 72 hours. Results for orders placed or performed during the hospital encounter of 12/23/16  Urine C&S     Status: Abnormal   Collection Time: 12/23/16  1:15 AM  Result Value Ref Range Status   Specimen Description URINE, CLEAN CATCH  Final   Special Requests NONE  Final   Culture >=100,000 COLONIES/mL ESCHERICHIA COLI (A)  Final   Report Status 12/25/2016 FINAL  Final   Organism ID, Bacteria ESCHERICHIA COLI (A)  Final      Susceptibility   Escherichia coli - MIC*    AMPICILLIN >=32 RESISTANT Resistant     CEFAZOLIN >=64 RESISTANT Resistant     CEFTRIAXONE <=1 SENSITIVE Sensitive     CIPROFLOXACIN <=0.25 SENSITIVE Sensitive     GENTAMICIN <=1  SENSITIVE Sensitive     IMIPENEM <=0.25 SENSITIVE Sensitive     NITROFURANTOIN <=16 SENSITIVE Sensitive     TRIMETH/SULFA <=20 SENSITIVE Sensitive     AMPICILLIN/SULBACTAM >=32 RESISTANT Resistant     PIP/TAZO 8 SENSITIVE Sensitive     Extended ESBL NEGATIVE Sensitive     * >=100,000 COLONIES/mL ESCHERICHIA COLI  Blood culture (routine x 2)     Status: Abnormal   Collection Time: 12/23/16  3:58 AM  Result Value Ref Range Status   Specimen Description BLOOD BLOOD LEFT FOREARM  Final   Special Requests   Final    BOTTLES DRAWN AEROBIC AND ANAEROBIC Blood Culture adequate volume   Culture  Setup Time   Final    GRAM NEGATIVE RODS IN BOTH AEROBIC AND ANAEROBIC BOTTLES CRITICAL RESULT CALLED TO, READ BACK BY AND VERIFIED WITH: D ZEIGLER PHARMD 2133 12/23/16 A BROWNING    Culture (A)  Final    ESCHERICHIA COLI SUSCEPTIBILITIES PERFORMED ON PREVIOUS CULTURE WITHIN THE LAST 5 DAYS. Performed at [facility], [address] [place], [place] [address]    Report Status 12/25/2016 FINAL  Final  Blood culture (routine x 2)     Status: Abnormal   Collection Time: 12/23/16  3:59 AM  Result Value Ref Range Status   Specimen Description BLOOD BLOOD RIGHT HAND  Final   Special Requests   Final    BOTTLES DRAWN AEROBIC AND ANAEROBIC  Blood Culture adequate volume   Culture  Setup Time   Final    GRAM NEGATIVE RODS ANAEROBIC BOTTLE ONLY Organism ID to follow CRITICAL RESULT CALLED TO, READ BACK BY AND VERIFIED WITHHurshel Party: D ZEIGLER [facility] Center[facility]ARMD 2133 12/23/16 A BROWNING Performed at [facility], [address] [place], [place] [address]    Culture ESCHERICHIA COLI (A)  Final   Report Status 12/25/2016 FINAL  Final   Organism ID, Bacteria ESCHERICHIA COLI  Final      Susceptibility   Escherichia coli - MIC*    AMPICILLIN >=32 RESISTANT Resistant     CEFAZOLIN >=64 RESISTANT Resistant     CEFEPIME <=1 SENSITIVE Sensitive     CEFTAZIDIME <=1 SENSITIVE Sensitive     CEFTRIAXONE <=1 SENSITIVE  Sensitive     CIPROFLOXACIN <=0.25 SENSITIVE Sensitive     GENTAMICIN <=1 SENSITIVE Sensitive     IMIPENEM <=0.25 SENSITIVE Sensitive     TRIMETH/SULFA <=20 SENSITIVE Sensitive     AMPICILLIN/SULBACTAM >=32 RESISTANT Resistant     PIP/TAZO <=4 SENSITIVE Sensitive     Extended ESBL NEGATIVE Sensitive     * ESCHERICHIA COLI  Blood Culture ID Panel (Reflexed)     Status: Abnormal   Collection Time: 12/23/16  3:59 AM  Result Value Ref Range Status   Enterococcus species NOT DETECTED NOT DETECTED Final   Vancomycin resistance NOT DETECTED NOT DETECTED Final   Listeria monocytogenes NOT DETECTED NOT DETECTED Final   Staphylococcus species NOT DETECTED NOT DETECTED Final   Staphylococcus aureus NOT DETECTED NOT DETECTED Final   Methicillin resistance NOT DETECTED NOT DETECTED Final   Streptococcus species NOT DETECTED NOT DETECTED Final   Streptococcus agalactiae NOT DETECTED NOT DETECTED Final   Streptococcus pneumoniae NOT DETECTED NOT DETECTED Final   Streptococcus pyogenes NOT DETECTED NOT DETECTED Final   Acinetobacter baumannii NOT DETECTED NOT DETECTED Final   Enterobacteriaceae species DETECTED (A) NOT DETECTED Final    Comment: CRITICAL RESULT CALLED TO, READ BACK BY AND VERIFIED WITH: D ZEIGLER PHARMD 2133 12/23/16 A BROWNING    Enterobacter cloacae complex NOT DETECTED NOT DETECTED Final   Escherichia coli DETECTED (A) NOT DETECTED Final    Comment: CRITICAL RESULT CALLED TO, READ BACK BY AND VERIFIED WITH: D ZEIGLER PHARMD 2133 12/23/16 A BROWNING    Klebsiella oxytoca NOT DETECTED NOT DETECTED Final   Klebsiella pneumoniae NOT DETECTED NOT DETECTED Final   Proteus species NOT DETECTED NOT DETECTED Final   Serratia marcescens NOT DETECTED NOT DETECTED Final   Carbapenem resistance NOT DETECTED NOT DETECTED Final   Haemophilus influenzae NOT DETECTED NOT DETECTED Final   Neisseria meningitidis NOT DETECTED NOT DETECTED Final   Pseudomonas aeruginosa NOT DETECTED NOT DETECTED  Final   Candida albicans NOT DETECTED NOT DETECTED Final   Candida glabrata NOT DETECTED NOT DETECTED Final   Candida krusei NOT DETECTED NOT DETECTED Final   Candida parapsilosis NOT DETECTED NOT DETECTED Final   Candida tropicalis NOT DETECTED NOT DETECTED Final    Comment: Performed at [facility], [address] [place], [place] [address]  MRSA PCR Screening     Status: None   Collection Time: 12/23/16  5:04 PM  Result Value Ref Range Status   MRSA by PCR NEGATIVE NEGATIVE Final    Comment:        The GeneXpert MRSA Assay (FDA approved for NASAL specimens only), is one component of a comprehensive MRSA colonization surveillance program. It is not intended to diagnose MRSA infection  nor to guide or monitor treatment for MRSA infections.     Studies/Results: No results found.  Assessment/Plan: 61yo with sepsis from a urinary source and positive blood cultures  1. PICC line to be placed tomorrow for IV home antibiotics 2. Continue zosyn   LOS: 3 days   Wilkie Aye 12/26/2016, 4:27 PM

## 2016-12-26 NOTE — Care Management Note (Signed)
Case Management Note  Patient Details  Name: Austin Gould MRN: [id number] Date of Birth: 01/30/1956  Subjective/Objective: 61 y/o m admitted w/Ureteral stone w/hydronephrosis. S/p Bilateral ureteral stent placement. From home.                    Action/Plan:d/c plan home.   Expected Discharge Date:  12/28/16               Expected Discharge Plan:  Home/Self Care  In-House Referral:     Discharge planning Services  CM Consult  Post Acute Care Choice:    Choice offered to:     DME Arranged:    DME Agency:     HH Arranged:    HH Agency:     Status of Service:  In process, will continue to follow  If discussed at Long Length of Stay Meetings, dates discussed:    Additional Comments:  Lanier ClamMahabir, Emberly Tomasso, RN 12/26/2016, 11:35 AM

## 2016-12-27 LAB — GLUCOSE, CAPILLARY
Glucose-Capillary: 165 mg/dL — ABNORMAL HIGH (ref 65–99)
Glucose-Capillary: 208 mg/dL — ABNORMAL HIGH (ref 65–99)
Glucose-Capillary: 199 mg/dL — ABNORMAL HIGH (ref 65–99)
Glucose-Capillary: 245 mg/dL — ABNORMAL HIGH (ref 65–99)

## 2016-12-27 MED ORDER — SODIUM CHLORIDE 0.9% FLUSH
10.0000 mL | INTRAVENOUS | Status: DC | PRN
  Administered 2016-12-28: 10 mL
  Filled 2016-12-27: qty 40

## 2016-12-27 NOTE — Care Management Note (Signed)
Case Management Note  Patient Details  Name: Austin Gould L Liotta MRN: [id number] Date of Birth: 01/29/1956  Subjective/Objective: Noted HHRN orders for iv abx. Provided patient/spouse(Tammy-nurse tech) HH infusion provider list-chose [facility]-spouse did inform me of issues in the past w/[facility]-I have alerted [facility] rep Pam to discuss spouse's concern. [facility] rep Clydie BraunKaren aware of HHRN-instruction. Awaiting PICC.                     Action/Plan:d/c plan home w/HHC/iv abx.   Expected Discharge Date:                 Expected Discharge Plan:  Home w Home Health Services  In-House Referral:     Discharge planning Services  CM Consult  Post Acute Care Choice:    Choice offered to:  Patient  DME Arranged:    DME Agency:     HH Arranged:  RN, IV Antibiotics HH Agency:  [facility]  Status of Service:  Completed, signed off  If discussed at Long Length of Stay Meetings, dates discussed:    Additional Comments:  Lanier ClamMahabir, Marrah Vanevery, RN 12/27/2016, 11:09 AM

## 2016-12-27 NOTE — Progress Notes (Signed)
Peripherally Inserted Central Catheter/Midline Placement  The IV Nurse has discussed with the patient and/or persons authorized to consent for the patient, the purpose of this procedure and the potential benefits and risks involved with this procedure.  The benefits include less needle sticks, lab draws from the catheter, and the patient may be discharged home with the catheter. Risks include, but not limited to, infection, bleeding, blood clot (thrombus formation), and puncture of an artery; nerve damage and irregular heartbeat and possibility to perform a PICC exchange if needed/ordered by physician.  Alternatives to this procedure were also discussed.  Bard Power PICC patient education guide, fact sheet on infection prevention and patient information card has been provided to patient /or left at bedside.    PICC/Midline Placement Documentation        Austin Gould, Austin Gould 12/27/2016, 4:50 PM

## 2016-12-28 LAB — GLUCOSE, CAPILLARY: GLUCOSE-CAPILLARY: 253 mg/dL — AB (ref 65–99)

## 2016-12-28 MED ORDER — CEFTRIAXONE SODIUM 2 G IJ SOLR: 2.0000 g | INTRAMUSCULAR | 13 refills | Status: DC

## 2016-12-28 MED ORDER — OXYCODONE HCL 10 MG PO TABS: 10.0000 mg | ORAL_TABLET | ORAL | 0 refills | Status: DC | PRN

## 2016-12-28 MED ORDER — TAMSULOSIN HCL 0.4 MG PO CAPS: 0.4000 mg | ORAL_CAPSULE | Freq: Every day | ORAL | 1 refills | Status: DC

## 2016-12-28 MED ORDER — HEPARIN SOD (PORK) LOCK FLUSH 100 UNIT/ML IV SOLN
250.0000 [IU] | INTRAVENOUS | Status: AC | PRN
  Administered 2016-12-28: 250 [IU]

## 2016-12-28 NOTE — Progress Notes (Signed)
D/cing  to home w/ wife PICC in place asymptomatic All d/c instructions given w/ verbal understanding

## 2016-12-28 NOTE — Progress Notes (Signed)
[facility] will be providing HHRN and Home Infusion Pharmacy services for Mr. Sample upon DC home.  I met with pt and wife yesterday and provided hands on teaching regarding IV ABX administration to support independence at home.  [facility] is prepared for DC when ordered by MD. Plan for dose in hospital on day of DC for next day SOC at home.  [facility] team will follow until DC to support transition home.  If patient discharges after hours, please call [phone].   Larry Sierras 12/28/2016, 8:12 AM

## 2016-12-28 NOTE — Discharge Instructions (Signed)

## 2017-01-02 NOTE — Discharge Summary (Signed)
Physician Discharge Summary  Patient ID: Austin Gould MRN: [id number] DOB/AGE: [birth year] 61 y.o.  Admit date: 12/23/2016 Discharge date: 12/28/2016  Admission Diagnoses: Ureteral calculus, sepsis from a urinary source Discharge Diagnoses:  Principal Problem:   Ureteral stone with hydronephrosis Active Problems:   HTN (hypertension)   DM (diabetes mellitus), type 2 (HCC)   Sepsis (HCC)   S/P cystoscopy   Discharged Condition: good  Hospital Course: The patient tolerated the procedure well and was transferred to the ICU on IV pain meds, IV fluid, vasopressors and IV antibiotics. His blood cultures greqw E coli and enterobactor sensitive to rocpehin. On POD#3 the patient was trasnferred to the floor and the foley was removed, pt was started on regular diet and they ambulated in the halls.  Prior to discharge the pt was tolerating a regular diet, pain was controlled on PO pain meds, they were ambulating without difficulty, and they had normal bowel function.   Consults: pulmonary/intensive care  Significant Diagnostic Studies: blood cultures  Treatments: surgery: bilateral ureteral stent placement  Discharge Exam: Blood pressure (!) 152/80, pulse 98, temperature 98.7 F (37.1 C), temperature source Oral, resp. rate 18, height 5\' 5"  (1.651 m), weight 83.9 kg (185 lb), SpO2 96 %. General appearance: alert, cooperative and appears stated age Head: Normocephalic, without obvious abnormality, atraumatic Nose: Nares normal. Septum midline. Mucosa normal. No drainage or sinus tenderness. Resp: clear to auscultation bilaterally Cardio: regular rate and rhythm, S1, S2 normal, no murmur, click, rub or gallop GI: soft, non-tender; bowel sounds normal; no masses,  no organomegaly Extremities: extremities normal, atraumatic, no cyanosis or edema Neurologic: Grossly normal  Disposition: 01-Home or Self Care  Discharge Instructions    Discharge patient    Complete by:  As directed     Home health for IV antibiotics   Discharge disposition:  01-Home or Self Care   Discharge patient date:  12/28/2016     Allergies as of 12/28/2016      Reactions   Ciprofloxacin Hives   Sulfamethoxazole-trimethoprim Hives      Medication List    TAKE these medications   aspirin EC 81 MG tablet Take 81 mg by mouth daily.   atorvastatin 40 MG tablet Commonly known as:  LIPITOR Take 40 mg by mouth every morning.   cefTRIAXone 2 g in dextrose 5 % 50 mL Inject 2 g into the vein daily.   esomeprazole 40 MG capsule Commonly known as:  NEXIUM Take 40 mg by mouth daily as needed (reflux).   famotidine 20 MG tablet Commonly known as:  PEPCID Take 20 mg by mouth 2 (two) times daily as needed for heartburn. Heart burn   Fish Oil 1000 MG Cpdr Take 1,000 mg by mouth 2 (two) times daily.   GARLIC PO Take 1 capsule by mouth 2 (two) times daily.   ibuprofen 200 MG tablet Commonly known as:  ADVIL,MOTRIN Take 600 mg by mouth every 6 (six) hours as needed for mild pain.   metFORMIN 1000 MG tablet Commonly known as:  GLUCOPHAGE Take 1,000 mg by mouth 2 (two) times daily with a meal.   olmesartan 40 MG tablet Commonly known as:  BENICAR Take 40 mg by mouth every evening.   Oxycodone HCl 10 MG Tabs Take 1 tablet (10 mg total) by mouth every 4 (four) hours as needed.   oxyCODONE-acetaminophen 5-325 MG tablet Commonly known as:  ROXICET Take 1-2 tablets by mouth every 6 (six) hours as needed for moderate pain or severe pain.  After lithotripsy.   phenazopyridine 200 MG tablet Commonly known as:  PYRIDIUM Take 1 tablet (200 mg total) by mouth 3 (three) times daily as needed for pain.   promethazine 25 MG tablet Commonly known as:  PHENERGAN Take 1 tablet (25 mg total) by mouth every 6 (six) hours as needed for nausea or vomiting.   saccharomyces boulardii 250 MG capsule Commonly known as:  FLORASTOR Take 500 mg by mouth 2 (two) times daily.   senna-docusate 8.6-50 MG  tablet Commonly known as:  Senokot-S Take 1 tablet by mouth at bedtime as needed for mild constipation.   tamsulosin 0.4 MG Caps capsule Commonly known as:  FLOMAX Take 1 capsule (0.4 mg total) by mouth daily. To help pass kidney stone fragments.      Follow-up Information    [facility] Follow up.   Why:  HH nursing-instruction,labs,picc flushes HH infusion-med,supplies Contact information: [address] [place] [address] [phone]        Malen Gauze, MD. Call in 2 week(s).   Specialty:  Urology Contact information: [address] [place] [address] [phone]           Signed: [name] 01/02/2017, 9:09 AM

## 2017-01-12 ENCOUNTER — Other Ambulatory Visit (HOSPITAL_COMMUNITY)
Admission: RE | Admit: 2017-01-12 | Discharge: 2017-01-12 | Disposition: A | Discharge status: Home / Self Care | Payer: Managed Care, Other (non HMO) | Source: Ambulatory Visit | Attending: Urology | Admitting: Urology

## 2017-01-12 DIAGNOSIS — N200 Calculus of kidney: Secondary | ICD-10-CM | POA: Insufficient documentation

## 2017-01-12 LAB — URIC ACID: Uric Acid, Serum: 6.7 mg/dL (ref 4.4–7.6)

## 2017-01-17 ENCOUNTER — Other Ambulatory Visit: Payer: Self-pay | Admitting: Urology

## 2017-01-17 LAB — CULTURE, BLOOD (ROUTINE X 2)
CULTURE: NO GROWTH
CULTURE: NO GROWTH
Special Requests: ADEQUATE

## 2017-02-08 ENCOUNTER — Encounter (HOSPITAL_BASED_OUTPATIENT_CLINIC_OR_DEPARTMENT_OTHER): Payer: Self-pay | Admitting: *Deleted

## 2017-02-08 NOTE — Progress Notes (Signed)
Spoke with wife-To [facility] at 1200-Istat on arrival-Npo solids after Mn-clear liquids(no pulp,dairy) until 0800-then Npo.may take pain med antiemetic as needed,lipitor,pepcid or nexium in am.

## 2017-02-13 ENCOUNTER — Encounter (HOSPITAL_BASED_OUTPATIENT_CLINIC_OR_DEPARTMENT_OTHER): Payer: Self-pay | Admitting: *Deleted

## 2017-02-13 ENCOUNTER — Ambulatory Visit (HOSPITAL_BASED_OUTPATIENT_CLINIC_OR_DEPARTMENT_OTHER): Payer: Managed Care, Other (non HMO) | Admitting: Anesthesiology

## 2017-02-13 ENCOUNTER — Ambulatory Visit (HOSPITAL_BASED_OUTPATIENT_CLINIC_OR_DEPARTMENT_OTHER)
Admission: RE | Admit: 2017-02-13 | Discharge: 2017-02-13 | Disposition: A | Discharge status: Home / Self Care | Payer: Managed Care, Other (non HMO) | Source: Ambulatory Visit | Attending: Urology | Admitting: Urology

## 2017-02-13 ENCOUNTER — Encounter (HOSPITAL_BASED_OUTPATIENT_CLINIC_OR_DEPARTMENT_OTHER)
Admission: RE | Disposition: A | Discharge status: Home / Self Care | Payer: Self-pay | Source: Ambulatory Visit | Attending: Urology

## 2017-02-13 DIAGNOSIS — Z79899 Other long term (current) drug therapy: Secondary | ICD-10-CM | POA: Diagnosis not present

## 2017-02-13 DIAGNOSIS — I10 Essential (primary) hypertension: Secondary | ICD-10-CM | POA: Diagnosis not present

## 2017-02-13 DIAGNOSIS — Z87891 Personal history of nicotine dependence: Secondary | ICD-10-CM | POA: Insufficient documentation

## 2017-02-13 DIAGNOSIS — Z7984 Long term (current) use of oral hypoglycemic drugs: Secondary | ICD-10-CM | POA: Insufficient documentation

## 2017-02-13 DIAGNOSIS — K219 Gastro-esophageal reflux disease without esophagitis: Secondary | ICD-10-CM | POA: Diagnosis not present

## 2017-02-13 DIAGNOSIS — Z7982 Long term (current) use of aspirin: Secondary | ICD-10-CM | POA: Insufficient documentation

## 2017-02-13 DIAGNOSIS — Z791 Long term (current) use of non-steroidal anti-inflammatories (NSAID): Secondary | ICD-10-CM | POA: Insufficient documentation

## 2017-02-13 DIAGNOSIS — N200 Calculus of kidney: Secondary | ICD-10-CM | POA: Insufficient documentation

## 2017-02-13 DIAGNOSIS — Z87442 Personal history of urinary calculi: Secondary | ICD-10-CM | POA: Insufficient documentation

## 2017-02-13 DIAGNOSIS — E785 Hyperlipidemia, unspecified: Secondary | ICD-10-CM | POA: Diagnosis not present

## 2017-02-13 DIAGNOSIS — Z888 Allergy status to other drugs, medicaments and biological substances status: Secondary | ICD-10-CM | POA: Insufficient documentation

## 2017-02-13 DIAGNOSIS — Z882 Allergy status to sulfonamides status: Secondary | ICD-10-CM | POA: Diagnosis not present

## 2017-02-13 DIAGNOSIS — E119 Type 2 diabetes mellitus without complications: Secondary | ICD-10-CM | POA: Diagnosis not present

## 2017-02-13 HISTORY — PX: HOLMIUM LASER APPLICATION: SHX5852

## 2017-02-13 HISTORY — DX: Anemia, unspecified: D64.9

## 2017-02-13 HISTORY — PX: CYSTOSCOPY WITH RETROGRADE PYELOGRAM, URETEROSCOPY AND STENT PLACEMENT: SHX5789

## 2017-02-13 LAB — POCT I-STAT, CHEM 8
BUN: 25 mg/dL — ABNORMAL HIGH (ref 6–20)
CHLORIDE: 101 mmol/L (ref 101–111)
CREATININE: 1.2 mg/dL (ref 0.61–1.24)
Calcium, Ion: 1.23 mmol/L (ref 1.15–1.40)
GLUCOSE: 151 mg/dL — AB (ref 65–99)
HCT: 34 % — ABNORMAL LOW (ref 39.0–52.0)
Hemoglobin: 11.6 g/dL — ABNORMAL LOW (ref 13.0–17.0)
POTASSIUM: 4 mmol/L (ref 3.5–5.1)
Sodium: 140 mmol/L (ref 135–145)
TCO2: 28 mmol/L (ref 22–32)

## 2017-02-13 LAB — GLUCOSE, CAPILLARY: Glucose-Capillary: 150 mg/dL — ABNORMAL HIGH (ref 65–99)

## 2017-02-13 SURGERY — CYSTOURETEROSCOPY, WITH RETROGRADE PYELOGRAM AND STENT INSERTION
Anesthesia: General | Site: Renal | Laterality: Bilateral

## 2017-02-13 MED ORDER — DEXTROSE 5 % IV SOLN
2.0000 g | INTRAVENOUS | Status: AC
  Administered 2017-02-13: 2 g via INTRAVENOUS
  Filled 2017-02-13: qty 2

## 2017-02-13 MED ORDER — TAMSULOSIN HCL 0.4 MG PO CAPS: 0.4000 mg | ORAL_CAPSULE | Freq: Every day | ORAL | 1 refills | Status: DC

## 2017-02-13 MED ORDER — TAMSULOSIN HCL 0.4 MG PO CAPS
0.4000 mg | ORAL_CAPSULE | Freq: Every day | ORAL | Status: DC
  Administered 2017-02-13: 0.4 mg via ORAL
  Filled 2017-02-13: qty 1

## 2017-02-13 MED ORDER — LACTATED RINGERS IV SOLN
INTRAVENOUS | Status: DC
  Filled 2017-02-13: qty 1000

## 2017-02-13 MED ORDER — OXYCODONE HCL 5 MG PO TABS
5.0000 mg | ORAL_TABLET | Freq: Once | ORAL | Status: DC | PRN
  Filled 2017-02-13: qty 1

## 2017-02-13 MED ORDER — LIDOCAINE 2% (20 MG/ML) 5 ML SYRINGE
INTRAMUSCULAR | Status: AC
  Filled 2017-02-13: qty 5

## 2017-02-13 MED ORDER — LACTATED RINGERS IV SOLN
INTRAVENOUS | Status: DC | PRN
  Administered 2017-02-13: 18:00:00 via INTRAVENOUS

## 2017-02-13 MED ORDER — MIDAZOLAM HCL 2 MG/2ML IJ SOLN
INTRAMUSCULAR | Status: DC | PRN
  Administered 2017-02-13: 2 mg via INTRAVENOUS

## 2017-02-13 MED ORDER — FENTANYL CITRATE (PF) 100 MCG/2ML IJ SOLN
INTRAMUSCULAR | Status: AC
  Filled 2017-02-13: qty 2

## 2017-02-13 MED ORDER — OXYCODONE-ACETAMINOPHEN 5-325 MG PO TABS: 1.0000 | ORAL_TABLET | Freq: Four times a day (QID) | ORAL | 0 refills | Status: AC | PRN

## 2017-02-13 MED ORDER — PHENAZOPYRIDINE HCL 100 MG PO TABS
ORAL_TABLET | ORAL | Status: AC
  Filled 2017-02-13: qty 2

## 2017-02-13 MED ORDER — MIDAZOLAM HCL 2 MG/2ML IJ SOLN
INTRAMUSCULAR | Status: AC
  Filled 2017-02-13: qty 2

## 2017-02-13 MED ORDER — PHENAZOPYRIDINE HCL 200 MG PO TABS: 200.0000 mg | ORAL_TABLET | Freq: Three times a day (TID) | ORAL | 0 refills | Status: DC | PRN

## 2017-02-13 MED ORDER — PROPOFOL 10 MG/ML IV BOLUS
INTRAVENOUS | Status: DC | PRN
  Administered 2017-02-13: 150 mg via INTRAVENOUS

## 2017-02-13 MED ORDER — CEFTRIAXONE SODIUM 2 G IJ SOLR
INTRAMUSCULAR | Status: AC
  Filled 2017-02-13: qty 2

## 2017-02-13 MED ORDER — KETOROLAC TROMETHAMINE 30 MG/ML IJ SOLN
INTRAMUSCULAR | Status: AC
  Filled 2017-02-13: qty 1

## 2017-02-13 MED ORDER — DEXAMETHASONE SODIUM PHOSPHATE 10 MG/ML IJ SOLN
INTRAMUSCULAR | Status: AC
  Filled 2017-02-13: qty 1

## 2017-02-13 MED ORDER — PROMETHAZINE HCL 25 MG PO TABS: 25.0000 mg | ORAL_TABLET | Freq: Four times a day (QID) | ORAL | 0 refills | Status: DC | PRN

## 2017-02-13 MED ORDER — DEXTROSE 5 % IV SOLN
INTRAVENOUS | Status: AC
  Filled 2017-02-13: qty 50

## 2017-02-13 MED ORDER — LIDOCAINE 2% (20 MG/ML) 5 ML SYRINGE
INTRAMUSCULAR | Status: DC | PRN
  Administered 2017-02-13: 60 mg via INTRAVENOUS

## 2017-02-13 MED ORDER — ONDANSETRON HCL 4 MG/2ML IJ SOLN
INTRAMUSCULAR | Status: DC | PRN
  Administered 2017-02-13: 4 mg via INTRAVENOUS

## 2017-02-13 MED ORDER — IOHEXOL 300 MG/ML  SOLN
INTRAMUSCULAR | Status: DC | PRN
  Administered 2017-02-13: 25 mL

## 2017-02-13 MED ORDER — FENTANYL CITRATE (PF) 100 MCG/2ML IJ SOLN
INTRAMUSCULAR | Status: DC | PRN
  Administered 2017-02-13: 50 ug via INTRAVENOUS

## 2017-02-13 MED ORDER — KETOROLAC TROMETHAMINE 30 MG/ML IJ SOLN
INTRAMUSCULAR | Status: DC | PRN
  Administered 2017-02-13: 30 mg via INTRAVENOUS

## 2017-02-13 MED ORDER — ONDANSETRON HCL 4 MG/2ML IJ SOLN
4.0000 mg | Freq: Four times a day (QID) | INTRAMUSCULAR | Status: DC | PRN
  Filled 2017-02-13: qty 2

## 2017-02-13 MED ORDER — TAMSULOSIN HCL 0.4 MG PO CAPS
ORAL_CAPSULE | ORAL | Status: AC
  Filled 2017-02-13: qty 1

## 2017-02-13 MED ORDER — SODIUM CHLORIDE 0.9 % IR SOLN
Status: DC | PRN
  Administered 2017-02-13: 4000 mL

## 2017-02-13 MED ORDER — DEXAMETHASONE SODIUM PHOSPHATE 10 MG/ML IJ SOLN
INTRAMUSCULAR | Status: DC | PRN
  Administered 2017-02-13: 10 mg via INTRAVENOUS

## 2017-02-13 MED ORDER — PHENYLEPHRINE 40 MCG/ML (10ML) SYRINGE FOR IV PUSH (FOR BLOOD PRESSURE SUPPORT)
PREFILLED_SYRINGE | INTRAVENOUS | Status: DC | PRN
  Administered 2017-02-13: 80 ug via INTRAVENOUS

## 2017-02-13 MED ORDER — PHENAZOPYRIDINE HCL 200 MG PO TABS
200.0000 mg | ORAL_TABLET | Freq: Three times a day (TID) | ORAL | Status: DC
  Administered 2017-02-13: 200 mg via ORAL
  Filled 2017-02-13: qty 1

## 2017-02-13 MED ORDER — SODIUM CHLORIDE 0.9 % IV SOLN
INTRAVENOUS | Status: DC
  Administered 2017-02-13 (×2): via INTRAVENOUS
  Filled 2017-02-13: qty 1000

## 2017-02-13 MED ORDER — OXYCODONE HCL 5 MG/5ML PO SOLN
5.0000 mg | Freq: Once | ORAL | Status: DC | PRN
  Filled 2017-02-13: qty 5

## 2017-02-13 MED ORDER — PHENYLEPHRINE 40 MCG/ML (10ML) SYRINGE FOR IV PUSH (FOR BLOOD PRESSURE SUPPORT)
PREFILLED_SYRINGE | INTRAVENOUS | Status: AC
  Filled 2017-02-13: qty 10

## 2017-02-13 MED ORDER — FENTANYL CITRATE (PF) 100 MCG/2ML IJ SOLN
25.0000 ug | INTRAMUSCULAR | Status: DC | PRN
  Filled 2017-02-13: qty 1

## 2017-02-13 MED ORDER — ONDANSETRON HCL 4 MG/2ML IJ SOLN
INTRAMUSCULAR | Status: AC
  Filled 2017-02-13: qty 2

## 2017-02-13 SURGICAL SUPPLY — 32 items
BAG DRAIN URO-CYSTO SKYTR STRL (DRAIN) ×3 IMPLANT
BASKET DAKOTA 1.9FR 11X120 (BASKET) IMPLANT
BASKET STONE 1.7 NGAGE (UROLOGICAL SUPPLIES) ×3 IMPLANT
CATH INTERMIT  6FR 70CM (CATHETERS) ×3 IMPLANT
CLOTH BEACON ORANGE TIMEOUT ST (SAFETY) ×3 IMPLANT
EVACUATOR MICROVAS BLADDER (UROLOGICAL SUPPLIES) IMPLANT
FIBER LASER FLEXIVA 1000 (UROLOGICAL SUPPLIES) IMPLANT
FIBER LASER FLEXIVA 200 (UROLOGICAL SUPPLIES) IMPLANT
FIBER LASER FLEXIVA 365 (UROLOGICAL SUPPLIES) IMPLANT
FIBER LASER FLEXIVA 550 (UROLOGICAL SUPPLIES) IMPLANT
FIBER LASER TRAC TIP (UROLOGICAL SUPPLIES) ×3 IMPLANT
GLOVE BIO SURGEON STRL SZ 6.5 (GLOVE) ×2 IMPLANT
GLOVE BIO SURGEON STRL SZ8 (GLOVE) ×3 IMPLANT
GLOVE BIO SURGEONS STRL SZ 6.5 (GLOVE) ×1
GLOVE BIOGEL PI IND STRL 6.5 (GLOVE) ×2 IMPLANT
GLOVE BIOGEL PI INDICATOR 6.5 (GLOVE) ×4
GOWN STRL REUS W/TWL LRG LVL3 (GOWN DISPOSABLE) ×6 IMPLANT
GOWN STRL REUS W/TWL XL LVL3 (GOWN DISPOSABLE) ×3 IMPLANT
GUIDEWIRE ANG ZIPWIRE 038X150 (WIRE) ×3 IMPLANT
GUIDEWIRE STR DUAL SENSOR (WIRE) ×3 IMPLANT
INFUSOR MANOMETER BAG 3000ML (MISCELLANEOUS) ×3 IMPLANT
IV NS IRRIG 3000ML ARTHROMATIC (IV SOLUTION) ×3 IMPLANT
KIT RM TURNOVER CYSTO AR (KITS) ×3 IMPLANT
MANIFOLD NEPTUNE II (INSTRUMENTS) ×3 IMPLANT
PACK CYSTO (CUSTOM PROCEDURE TRAY) ×3 IMPLANT
SHEATH ACCESS URETERAL 38CM (SHEATH) ×3 IMPLANT
STENT URET 6FRX24 CONTOUR (STENTS) ×6 IMPLANT
STENT URET 6FRX26 CONTOUR (STENTS) IMPLANT
SYRINGE 10CC LL (SYRINGE) ×3 IMPLANT
TUBE CONNECTING 12'X1/4 (SUCTIONS) ×1
TUBE CONNECTING 12X1/4 (SUCTIONS) ×2 IMPLANT
TUBE FEEDING 8FR 16IN STR KANG (MISCELLANEOUS) IMPLANT

## 2017-02-13 NOTE — Discharge Instructions (Signed)
Ureteral Stent Implantation, Care After °Refer to this sheet in the next few weeks. These instructions provide you with information about caring for yourself after your procedure. Your health care provider may also give you more specific instructions. Your treatment has been planned according to current medical practices, but problems sometimes occur. Call your health care provider if you have any problems or questions after your procedure. °What can I expect after the procedure? °After the procedure, it is common to have: °· Nausea. °· Mild pain when you urinate. You may feel this pain in your lower back or lower abdomen. Pain should stop within a few minutes after you urinate. This may last for up to 1 week. °· A small amount of blood in your urine for several days. ° °Follow these instructions at home: ° °Medicines °· Take over-the-counter and prescription medicines only as told by your health care provider. °· If you were prescribed an antibiotic medicine, take it as told by your health care provider. Do not stop taking the antibiotic even if you start to feel better. °· Do not drive for 24 hours if you received a sedative. °· Do not drive or operate heavy machinery while taking prescription pain medicines. °Activity °· Return to your normal activities as told by your health care provider. Ask your health care provider what activities are safe for you. °· Do not lift anything that is heavier than 10 lb (4.5 kg). Follow this limit for 1 week after your procedure, or for as long as told by your health care provider. °General instructions °· Watch for any blood in your urine. Call your health care provider if the amount of blood in your urine increases. °· If you have a catheter: °? Follow instructions from your health care provider about taking care of your catheter and collection bag. °? Do not take baths, swim, or use a hot tub until your health care provider approves. °· Drink enough fluid to keep your urine  clear or pale yellow. °· Keep all follow-up visits as told by your health care provider. This is important. °Contact a health care provider if: °· You have pain that gets worse or does not get better with medicine, especially pain when you urinate. °· You have difficulty urinating. °· You feel nauseous or you vomit repeatedly during a period of more than 2 days after the procedure. °Get help right away if: °· Your urine is dark red or has blood clots in it. °· You are leaking urine (have incontinence). °· The end of the stent comes out of your urethra. °· You cannot urinate. °· You have sudden, sharp, or severe pain in your abdomen or lower back. °· You have a fever. °This information is not intended to replace advice given to you by your health care provider. Make sure you discuss any questions you have with your health care provider. °Document Released: 01/02/2013 Document Revised: 10/08/2015 Document Reviewed: 11/14/2014 °Elsevier Interactive Patient Education © 2018 [employer] ° ° °Post Anesthesia Home Care Instructions ° °Activity: °Get plenty of rest for the remainder of the day. A responsible individual must stay with you for 24 hours following the procedure.  °For the next 24 hours, DO NOT: °-Drive a car °-Operate machinery °-Drink alcoholic beverages °-Take any medication unless instructed by your physician °-Make any legal decisions or sign important papers. ° °Meals: °Start with liquid foods such as gelatin or soup. Progress to regular foods as tolerated. Avoid greasy, spicy, heavy foods. If nausea   and/or vomiting occur, drink only clear liquids until the nausea and/or vomiting subsides. Call your physician if vomiting continues. ° °Special Instructions/Symptoms: °Your throat may feel dry or sore from the anesthesia or the breathing tube placed in your throat during surgery. If this causes discomfort, gargle with warm salt water. The discomfort should disappear within 24 hours. ° °If you had a  scopolamine patch placed behind your ear for the management of post- operative nausea and/or vomiting: ° °1. The medication in the patch is effective for 72 hours, after which it should be removed.  Wrap patch in a tissue and discard in the trash. Wash hands thoroughly with soap and water. °2. You may remove the patch earlier than 72 hours if you experience unpleasant side effects which may include dry mouth, dizziness or visual disturbances. °3. Avoid touching the patch. Wash your hands with soap and water after contact with the patch. °  ° °

## 2017-02-13 NOTE — H&P (Signed)
Urology Admission H&P  Chief Complaint: bilateral flank pain  History of Present Illness: Mr Bacci is a 61yo with a hx of bilateral ureteral calculi s/p bilateral stent placement  Past Medical History:  Diagnosis Date  . Anemia 12/23/2016   IRON SUPPLEMENT  . Bilateral ureteral calculi 10/04/2016  . Diabetes mellitus    type 2  . GERD (gastroesophageal reflux disease)    PRN MEDS as need  . History of kidney stones 10/04/2016   bil. per CT  . Hyperlipidemia   . Hypertension   . Umbilical hernia    Past Surgical History:  Procedure Laterality Date  . CYSTOSCOPY W/ URETERAL STENT PLACEMENT Bilateral 10/04/2016   Procedure: CYSTOSCOPY WITH RETROGRADE PYELOGRAM/BILATERAL URETERAL STENT PLACEMENT;  Surgeon: Ihor Gully, MD;  Location: [facility];  Service: Urology;  Laterality: Bilateral;  . CYSTOSCOPY W/ URETERAL STENT PLACEMENT Bilateral 12/23/2016   Procedure: CYSTOSCOPY WITH RETROGRADE PYELOGRAM/URETERAL BILATERAL STENT PLACEMENT;  Surgeon: Malen Gauze, MD;  Location: [facility];  Service: Urology;  Laterality: Bilateral;  . CYSTOSCOPY WITH RETROGRADE PYELOGRAM, URETEROSCOPY AND STENT PLACEMENT Bilateral 11/25/2016   Procedure: CYSTOSCOPY BILATERAL STENT REMOVAL  BILATERAL, URETEROSCOPY STONE REMOVAL  AND left  STENT REPLACEMENT with holmium laser;  Surgeon: Ihor Gully, MD;  Location: [facility];  Service: Urology;  Laterality: Bilateral;  . CYSTOSCOPY WITH URETEROSCOPY  10/19/2011   Procedure: CYSTOSCOPY WITH URETEROSCOPY;  Surgeon: Milford Cage, MD;  Location: [facility];  Service: Urology;  Laterality: Left;  cystoscopy, left ureteroscopy, holmium laser, lithotripsey left ureter stent placement   . EXTRACORPOREAL SHOCK WAVE LITHOTRIPSY Left 08/18/2011  . EXTRACORPOREAL SHOCK WAVE LITHOTRIPSY Left 10/17/2016   Procedure: LEFT EXTRACORPOREAL SHOCK WAVE LITHOTRIPSY (ESWL);  Surgeon: Sebastian Ache, MD;  Location: [facility];  Service: Urology;   Laterality: Left;  . HOLMIUM LASER APPLICATION Bilateral 11/25/2016   Procedure: HOLMIUM LASER APPLICATION;  Surgeon: Ihor Gully, MD;  Location[facility];  Service: Urology;  Laterality: Bilateral;    Home Medications:  Prescriptions Prior to Admission  Medication Sig Dispense Refill Last Dose  . aspirin EC 81 MG tablet Take 81 mg by mouth daily.   Past Month at Unknown time  . atorvastatin (LIPITOR) 40 MG tablet Take 40 mg by mouth every morning.    Past Week at Unknown time  . esomeprazole (NEXIUM) 40 MG capsule Take 40 mg by mouth daily as needed (reflux).   Past Week at Unknown time  . famotidine (PEPCID) 20 MG tablet Take 20 mg by mouth 2 (two) times daily as needed for heartburn. Heart burn    Past Week at Unknown time  . metFORMIN (GLUCOPHAGE) 1000 MG tablet Take 1,000 mg by mouth 2 (two) times daily with a meal.   Past Week at Unknown time  . olmesartan (BENICAR) 40 MG tablet Take 40 mg by mouth every evening.   Past Week at Unknown time  . Omega-3 Fatty Acids (FISH OIL) 1000 MG CPDR Take 1,000 mg by mouth 2 (two) times daily.   Past Month at Unknown time  . Oxycodone HCl 10 MG TABS Take 1 tablet (10 mg total) by mouth every 4 (four) hours as needed. 30 tablet 0 Past Week at Unknown time  . phenazopyridine (PYRIDIUM) 200 MG tablet Take 1 tablet (200 mg total) by mouth 3 (three) times daily as needed for pain. 30 tablet 0 Past Week at Unknown time  . Polysaccharide Iron Complex (NU-IRON PO) Take by mouth daily.   Past Week  at Unknown time  . promethazine (PHENERGAN) 25 MG tablet Take 1 tablet (25 mg total) by mouth every 6 (six) hours as needed for nausea or vomiting. 30 tablet 0 Past Week at Unknown time  . senna-docusate (SENOKOT-S) 8.6-50 MG tablet Take 1 tablet by mouth at bedtime as needed for mild constipation. 30 tablet 0 Past Week at Unknown time  . vitamin C (ASCORBIC ACID) 500 MG tablet Take 500 mg by mouth daily.   Past Week at Unknown time  . cefTRIAXone 2  g in dextrose 5 % 50 mL Inject 2 g into the vein daily. 2 g 13   . GARLIC PO Take 1 capsule by mouth 2 (two) times daily.   More than a month at Unknown time  . ibuprofen (ADVIL,MOTRIN) 200 MG tablet Take 600 mg by mouth every 6 (six) hours as needed for mild pain.   More than a month at Unknown time  . oxyCODONE-acetaminophen (ROXICET) 5-325 MG tablet Take 1-2 tablets by mouth every 6 (six) hours as needed for moderate pain or severe pain. After lithotripsy. (Patient not taking: Reported on 12/23/2016) 30 tablet 0 Unknown at Unknown time  . saccharomyces boulardii (FLORASTOR) 250 MG capsule Take 500 mg by mouth 2 (two) times daily.   Unknown at Unknown time  . tamsulosin (FLOMAX) 0.4 MG CAPS capsule Take 1 capsule (0.4 mg total) by mouth daily. To help pass kidney stone fragments. 30 capsule 1 More than a month at Unknown time   Allergies:  Allergies  Allergen Reactions  . Ciprofloxacin Hives  . Sulfamethoxazole-Trimethoprim Hives    History reviewed. No pertinent family history. Social History:  reports that he has quit smoking. His smoking use included Cigarettes. He quit after 25.00 years of use. He has never used smokeless tobacco. He reports that he does not drink alcohol or use drugs.  Review of Systems  Genitourinary: Positive for flank pain and hematuria.  All other systems reviewed and are negative.   Physical Exam:  Vital signs in last 24 hours: Temp:  [98.4 F (36.9 C)] 98.4 F (36.9 C) (10/01 1153) Pulse Rate:  [73] 73 (10/01 1153) Resp:  [18] 18 (10/01 1153) BP: (117)/(69) 117/69 (10/01 1153) SpO2:  [97 %] 97 % (10/01 1153) Weight:  [81.6 kg (180 lb)] 81.6 kg (180 lb) (10/01 1243) Physical Exam  Constitutional: He is oriented to person, place, and time. He appears well-developed and well-nourished.  HENT:  Head: Normocephalic and atraumatic.  Eyes: Pupils are equal, round, and reactive to light. EOM are normal.  Neck: Normal range of motion. No thyromegaly present.   Cardiovascular: Normal rate and regular rhythm.   Respiratory: Effort normal. No respiratory distress.  GI: Soft. He exhibits no distension.  Musculoskeletal: Normal range of motion. He exhibits no edema.  Neurological: He is alert and oriented to person, place, and time.  Skin: Skin is warm and dry.  Psychiatric: He has a normal mood and affect. His behavior is normal. Judgment and thought content normal.    Laboratory Data:  Results for orders placed or performed during the hospital encounter of 02/13/17 (from the past 24 hour(s))  I-STAT, chem 8     Status: Abnormal   Collection Time: 02/13/17  1:28 PM  Result Value Ref Range   Sodium 140 135 - 145 mmol/L   Potassium 4.0 3.5 - 5.1 mmol/L   Chloride 101 101 - 111 mmol/L   BUN 25 (H) 6 - 20 mg/dL   Creatinine, Ser 9.60 0.61 -  1.24 mg/dL   Glucose, Bld 161 (H) 65 - 99 mg/dL   Calcium, Ion 0.96 0.45 - 1.40 mmol/L   TCO2 28 22 - 32 mmol/L   Hemoglobin 11.6 (L) 13.0 - 17.0 g/dL   HCT 40.9 (L) 81.1 - 91.4 %   No results found for this or any previous visit (from the past 240 hour(s)). Creatinine:  Recent Labs  02/13/17 1328  CREATININE 1.20   Baseline Creatinine: 1.2  Impression/Assessment:  61yo with bilateral renal calculi  Plan:  The risks/benefits/alternatives to bilateral ureteral stone extraction was explained to the patient and he understands and wishes to proceed with surgery  Wilkie Aye 02/13/2017, 3:35 PM

## 2017-02-13 NOTE — Anesthesia Preprocedure Evaluation (Signed)
Anesthesia Evaluation  Patient identified by MRN, date of birth, ID band Patient awake    Reviewed: Allergy & Precautions, H&P , NPO status , Patient's Chart, lab work & pertinent test results  Airway Mallampati: II   Neck ROM: full    Dental   Pulmonary former smoker,    breath sounds clear to auscultation       Cardiovascular hypertension,  Rhythm:regular Rate:Normal     Neuro/Psych    GI/Hepatic GERD  ,  Endo/Other  diabetes, Type 2  Renal/GU Renal diseasestones     Musculoskeletal   Abdominal   Peds  Hematology   Anesthesia Other Findings   Reproductive/Obstetrics                             Anesthesia Physical Anesthesia Plan  ASA: II  Anesthesia Plan: General   Post-op Pain Management:    Induction: Intravenous  PONV Risk Score and Plan: 2 and Ondansetron, Dexamethasone, Midazolam and Treatment may vary due to age or medical condition  Airway Management Planned: LMA  Additional Equipment:   Intra-op Plan:   Post-operative Plan:   Informed Consent: I have reviewed the patients History and Physical, chart, labs and discussed the procedure including the risks, benefits and alternatives for the proposed anesthesia with the patient or authorized representative who has indicated his/her understanding and acceptance.     Plan Discussed with: CRNA, Anesthesiologist and Surgeon  Anesthesia Plan Comments:         Anesthesia Quick Evaluation

## 2017-02-13 NOTE — Transfer of Care (Signed)
Immediate Anesthesia Transfer of Care Note  Patient: KYUSS HALE  Procedure(s) Performed: CYSTOSCOPY WITH RETROGRADE PYELOGRAM, URETEROSCOPY STONE BASKETRY AND STENT REPLACEMENT (Bilateral Renal) HOLMIUM LASER APPLICATION (Bilateral Renal)  Patient Location: PACU  Anesthesia Type:General  Level of Consciousness: sedated and responds to stimulation  Airway & Oxygen Therapy: Patient Spontanous Breathing and Patient connected to nasal cannula oxygen  Post-op Assessment: Report given to RN  Post vital signs: Reviewed and stable  Last Vitals: 129/78, 80, 15, 98%, 97.6 Vitals:   02/13/17 1153  BP: 117/69  Pulse: 73  Resp: 18  Temp: 36.9 C  SpO2: 97%    Last Pain:  Vitals:   02/13/17 1243  TempSrc:   PainSc: 3       Patients Stated Pain Goal: 0 (02/13/17 1243)  Complications: No apparent anesthesia complications

## 2017-02-13 NOTE — Anesthesia Postprocedure Evaluation (Signed)
Anesthesia Post Note  Patient: Austin Gould  Procedure(s) Performed: CYSTOSCOPY WITH RETROGRADE PYELOGRAM, URETEROSCOPY STONE BASKETRY AND STENT REPLACEMENT (Bilateral Renal) HOLMIUM LASER APPLICATION (Bilateral Renal)     Patient location during evaluation: PACU Anesthesia Type: General Level of consciousness: awake, awake and alert and oriented Pain management: pain level controlled Vital Signs Assessment: post-procedure vital signs reviewed and stable Respiratory status: spontaneous breathing, nonlabored ventilation and respiratory function stable Cardiovascular status: blood pressure returned to baseline Postop Assessment: no headache Anesthetic complications: no    Last Vitals:  Vitals:   02/13/17 1856 02/13/17 1900  BP:  (!) 142/76  Pulse: 87 82  Resp: (!) 25 (!) 21  Temp:    SpO2: 96% 96%    Last Pain:  Vitals:   02/13/17 1806  TempSrc:   PainSc: Asleep                 Shloime Keilman COKER

## 2017-02-13 NOTE — Op Note (Signed)
Austin KitchenPreoperative diagnosis: bilateral renal calculi  Postoperative diagnosis: Same  Procedure: 1 cystoscopy 2. bilateralretrograde pyelography 3.  Intraoperative fluoroscopy, under one hour, with interpretation 4.  Bilateral ureteroscopic stone manipulation with laser lithotripsy 5.  bilateral 6 x 26 JJ stent exchange  Attending: Cleda Mccreedy  Anesthesia: General  Estimated blood loss: None  Drains: bilateral 6 x 24 JJ ureteral stent withour tether  Specimens: stone for analysis  Antibiotics: rocephin  Findings: bilateral mid and lower pole renal calculi. No hydronephrosis. No masses/lesions in the bladder. Ureteral orifices in normal anatomic location.  Indications: Patient is a 61 year old male with a history of bilateral renal calculi and bilateral flank pain. He underwent bilateral stent placement for sepsis. After discussing treatment options, they decided proceed with bilateral ureteroscopic stone manipulation.  Procedure her in detail: The patient was brought to the operating room and a brief timeout was done to ensure correct patient, correct procedure, correct site.  General anesthesia was administered patient was placed in dorsal lithotomy position.  Her genitalia was then prepped and draped in usual sterile fashion.  A rigid 22 French cystoscope was passed in the urethra and the bladder.  Bladder was inspected free masses or lesions.  the ureteral orifices were in the normal orthotopic locations. Using a grasper the left ureteral stent was brought to the urethral meatus. A zipwire was advanced through the stent and up to the renal pelvis. The stent was then removed. a 6 french ureteral catheter was then instilled into the left ureteral orifice.  a gentle retrograde was obtained and findings noted above.  we then removed the cystoscope and cannulated the left ureteral orifice with a semirigid ureteroscope.  We located no stone in the ureter. We then placed a sensor wire up to  the renal pelvis. We removed the scope and advanced a 12/14 x 38cm access sheath up to the renal pelvis. We then used the flexible ureteroscope to perform nephroscopy. We located calculi in the mid and lower poles which were fragmented with a 200nm laser fiber. The fragments were then removed with an NGage basket. Once the stone were removed we then removed the access sheath under direct vision and noted to injury to the ureter.  We then placed a 6 x 26 double-j ureteral stent over the original zip wire. We then removed the wire and good coil was noted in the the renal pelvis under fluoroscopy and the bladder under direct vision.  We then turned out attention to the right side.  Using a grasper the right ureteral stent was brought to the urethral meatus. A zipwire was advanced through the stent and up to the renal pelvis. a 6 french ureteral catheter was then instilled into the right ureteral orifice.  a gentle retrograde was obtained and findings noted above. we then removed the cystoscope and cannulated the right ureteral orifice with a semirigid ureteroscope.  We located no stone in the ureter. We then placed a sensor wire up to the renal pelvis. We removed the scope and advanced a 12/14 x 38cm access sheath up to the renal pelvis. We then used the flexible ureteroscope to perform nephroscopy. We located calculi in the mid and lower poles which were fragmented with a 200nm laser fiber. The fragments were then removed with an NGage basket. Once the stone were removed we then removed the access sheath under direct vision and noted to injury to the ureter. we then placed a 6 x 26 double-j ureteral stent over the original zip wire.  We then removed the wire and good coil was noted in the the renal pelvis under fluoroscopy and the bladder under direct vision.   the bladder was then drained and this concluded the procedure which was well tolerated by patient.  Complications: None  Condition: Stable, extubated,  transferred to PACU  Plan: Patient is to be discharged home as to follow-up in 1 week for stent removal

## 2017-02-13 NOTE — Anesthesia Procedure Notes (Signed)
Procedure Name: LMA Insertion Date/Time: 02/13/2017 4:56 PM Performed by: Tyrone Nine Pre-anesthesia Checklist: Patient identified, Timeout performed, Emergency Drugs available, Suction available and Patient being monitored Patient Re-evaluated:Patient Re-evaluated prior to induction Oxygen Delivery Method: Circle system utilized Preoxygenation: Pre-oxygenation with 100% oxygen Induction Type: IV induction Ventilation: Mask ventilation without difficulty LMA: LMA inserted LMA Size: 4.0 Number of attempts: 1 Placement Confirmation: breath sounds checked- equal and bilateral,  CO2 detector and positive ETCO2 Tube secured with: Tape Dental Injury: Teeth and Oropharynx as per pre-operative assessment

## 2017-02-14 ENCOUNTER — Encounter (HOSPITAL_BASED_OUTPATIENT_CLINIC_OR_DEPARTMENT_OTHER): Payer: Self-pay | Admitting: Urology

## 2017-12-15 ENCOUNTER — Ambulatory Visit: Payer: Self-pay | Admitting: General Surgery

## 2017-12-15 NOTE — H&P (View-Only) (Signed)
History of Present Illness (Austin Gould; 12/15/2017 1:42 PM) The patient is a 62 year old male who presents with an umbilical hernia. Chief Complaint: Umbilical hernia  Patient is a 62-year-old male with a several month history of an umbilical hernia. Patient states it has gotten bigger. 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 Patient states he has some pain on and off. He states that is fairly dull. Patient did have to go to the ER at 1 time secondary to the inability to reduce the hernia. Patient denies any nausea, vomiting, fever, chills.  Patient previously has undergone hospitalization for sepsis secondary to kidney stones. He's had no previous abdominal surgeries.    Diagnostic Studies History (Austin Gould; 12/15/2017 1:28 PM) Colonoscopy  1-5 years ago  Allergies (Austin Gould; 12/15/2017 1:29 PM) Sulfa 10 *OPHTHALMIC AGENTS*  Cipro *FLUOROQUINOLONES*   Medication History (Austin Gould; 12/15/2017 1:31 PM) MetFORMIN HCl (1000MG Tablet, Oral) Active. Atorvastatin Calcium (40MG Tablet, Oral) Active. Olmesartan Medoxomil (40MG Tablet, Oral) Active. Tamsulosin HCl (0.4MG Capsule, Oral) Active. Allopurinol (300MG Tablet, Oral) Active. Sodium & Potassium Bicarbonate (Oral) Active. Medications Reconciled  Social History (Austin Gould; 12/15/2017 1:28 PM) Alcohol use  Remotely quit alcohol use. Caffeine use  Coffee. No drug use  Tobacco use  Never smoker.  Family History (Austin Gould; 12/15/2017 1:28 PM) Breast Cancer  Sister. Colon Polyps  Sister. Depression  Sister. Diabetes Mellitus  Mother, Sister. Heart Disease  Father, Mother. Hypertension  Father. Ovarian Cancer  Mother. Thyroid problems  Mother.  Other Problems (Austin Gould; 12/15/2017 1:28 PM) Diabetes Mellitus  High blood pressure  Hypercholesterolemia  Kidney Stone     Review of Systems (Austin Gould;  12/15/2017 1:40 PM) General Not Present- Appetite Loss, Chills, Fatigue, Fever, Night Sweats, Weight Gain and Weight Loss. Skin Not Present- Change in Wart/Mole, Dryness, Hives, Jaundice, New Lesions, Non-Healing Wounds, Rash and Ulcer. HEENT Present- Wears glasses/contact lenses. Not Present- Earache, Hearing Loss, Hoarseness, Nose Bleed, Oral Ulcers, Ringing in the Ears, Seasonal Allergies, Sinus Pain, Sore Throat, Visual Disturbances and Yellow Eyes. Respiratory Not Present- Bloody sputum, Chronic Cough, Difficulty Breathing, Snoring and Wheezing. Cardiovascular Present- Leg Cramps. Not Present- Chest Pain, Difficulty Breathing Lying Down, Palpitations, Rapid Heart Rate, Shortness of Breath and Swelling of Extremities. Gastrointestinal Not Present- Abdominal Pain, Bloating, Bloody Stool, Change in Bowel Habits, Chronic diarrhea, Constipation, Difficulty Swallowing, Excessive gas, Gets full quickly at meals, Hemorrhoids, Indigestion, Nausea, Rectal Pain and Vomiting. Male Genitourinary Not Present- Blood in Urine, Change in Urinary Stream, Frequency, Impotence, Nocturia, Painful Urination, Urgency and Urine Leakage. All other systems negative  Vitals (Austin Gould; 12/15/2017 1:32 PM) 12/15/2017 1:31 PM Weight: 194.5 lb Height: 65in Body Surface Area: 1.95 m Body Mass Index: 32.37 kg/m  Temp.: 98.7F(Oral)  Pulse: 122 (Regular)  BP: 132/80 (Sitting, Left Arm, Standard)       Physical Exam (Jerene Yeager Gould; 12/15/2017 1:42 PM) The physical exam findings are as follows: Note:Constitutional: No acute distress, conversant, appears stated age  Eyes: Anicteric sclerae, moist conjunctiva, no lid lag  Neck: No thyromegaly, trachea midline, no cervical lymphadenopathy  Lungs: Clear to auscultation biilaterally, normal respiratory effot  Cardiovascular: regular rate & rhythm, no murmurs, no peripheal edema, pedal pulses 2+  GI: Soft, no masses or hepatosplenomegaly, non-tender  to palpation  MSK: Normal gait, no clubbing cyanosis, edema  Skin: No rashes, palpation reveals normal skin turgor  Psychiatric: Appropriate judgment and insight,   oriented to person, place, and time  Abdomen Inspection Hernias - Umbilical hernia - Reducible(Large).    Assessment & Plan (Issaic Welliver Gould; 12/15/2017 1:43 PM) UMBILICAL HERNIA WITHOUT OBSTRUCTION AND WITHOUT GANGRENE (K42.9) Impression: 62-year-old male with a incarcerated umbilical hernia. 1. The patient will like to proceed to the operating room for laparoscopic umbilical hernia repair with mesh.  2. I discussed with the patient the signs and symptoms of incarceration and strangulation and the need to proceed to the ER should they occur.  3. I discussed with the patient the risks and benefits of the procedure to include but not limited to: Infection, bleeding, damage to surrounding structures, possible need for further surgery, possible nerve pain, and possible recurrence. The patient was understanding and wishes to proceed. 

## 2017-12-15 NOTE — H&P (Signed)
History of Present Illness Axel Filler MD; 12/15/2017 1:42 PM) The patient is a 62 year old male who presents with an umbilical hernia. Chief Complaint: Umbilical hernia  Patient is a 62 year old male with a several month history of an umbilical hernia. Patient states it has gotten bigger. 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 Patient states he has some pain on and off. He states that is fairly dull. Patient did have to go to the ER at 1 time secondary to the inability to reduce the hernia. Patient denies any nausea, vomiting, fever, chills.  Patient previously has undergone hospitalization for sepsis secondary to kidney stones. He's had no previous abdominal surgeries.    Diagnostic Studies History Maurilio Lovely; 12/15/2017 1:28 PM) Colonoscopy  1-5 years ago  Allergies Maurilio Lovely; 12/15/2017 1:29 PM) Sulfa 10 *OPHTHALMIC AGENTS*  Cipro *FLUOROQUINOLONES*   Medication History Maurilio Lovely; 12/15/2017 1:31 PM) MetFORMIN HCl (1000MG  Tablet, Oral) Active. Atorvastatin Calcium (40MG  Tablet, Oral) Active. Olmesartan Medoxomil (40MG  Tablet, Oral) Active. Tamsulosin HCl (0.4MG  Capsule, Oral) Active. Allopurinol (300MG  Tablet, Oral) Active. Sodium & Potassium Bicarbonate (Oral) Active. Medications Reconciled  Social History Maurilio Lovely; 12/15/2017 1:28 PM) Alcohol use  Remotely quit alcohol use. Caffeine use  Coffee. No drug use  Tobacco use  Never smoker.  Family History Maurilio Lovely; 12/15/2017 1:28 PM) Breast Cancer  Sister. Colon Polyps  Sister. Depression  Sister. Diabetes Mellitus  Mother, Sister. Heart Disease  Father, Mother. Hypertension  Father. Ovarian Cancer  Mother. Thyroid problems  Mother.  Other Problems Maurilio Lovely; 12/15/2017 1:28 PM) Diabetes Mellitus  High blood pressure  Hypercholesterolemia  Kidney Stone     Review of Systems Axel Filler MD;  12/15/2017 1:40 PM) General Not Present- Appetite Loss, Chills, Fatigue, Fever, Night Sweats, Weight Gain and Weight Loss. Skin Not Present- Change in Wart/Mole, Dryness, Hives, Jaundice, New Lesions, Non-Healing Wounds, Rash and Ulcer. HEENT Present- Wears glasses/contact lenses. Not Present- Earache, Hearing Loss, Hoarseness, Nose Bleed, Oral Ulcers, Ringing in the Ears, Seasonal Allergies, Sinus Pain, Sore Throat, Visual Disturbances and Yellow Eyes. Respiratory Not Present- Bloody sputum, Chronic Cough, Difficulty Breathing, Snoring and Wheezing. Cardiovascular Present- Leg Cramps. Not Present- Chest Pain, Difficulty Breathing Lying Down, Palpitations, Rapid Heart Rate, Shortness of Breath and Swelling of Extremities. Gastrointestinal Not Present- Abdominal Pain, Bloating, Bloody Stool, Change in Bowel Habits, Chronic diarrhea, Constipation, Difficulty Swallowing, Excessive gas, Gets full quickly at meals, Hemorrhoids, Indigestion, Nausea, Rectal Pain and Vomiting. Male Genitourinary Not Present- Blood in Urine, Change in Urinary Stream, Frequency, Impotence, Nocturia, Painful Urination, Urgency and Urine Leakage. All other systems negative  Vitals Maurilio Lovely; 12/15/2017 1:32 PM) 12/15/2017 1:31 PM Weight: 194.5 lb Height: 65in Body Surface Area: 1.95 m Body Mass Index: 32.37 kg/m  Temp.: 98.36F(Oral)  Pulse: 122 (Regular)  BP: 132/80 (Sitting, Left Arm, Standard)       Physical Exam Axel Filler MD; 12/15/2017 1:42 PM) The physical exam findings are as follows: Note:Constitutional: No acute distress, conversant, appears stated age  Eyes: Anicteric sclerae, moist conjunctiva, no lid lag  Neck: No thyromegaly, trachea midline, no cervical lymphadenopathy  Lungs: Clear to auscultation biilaterally, normal respiratory effot  Cardiovascular: regular rate & rhythm, no murmurs, no peripheal edema, pedal pulses 2+  GI: Soft, no masses or hepatosplenomegaly, non-tender  to palpation  MSK: Normal gait, no clubbing cyanosis, edema  Skin: No rashes, palpation reveals normal skin turgor  Psychiatric: Appropriate judgment and insight,  oriented to person, place, and time  Abdomen Inspection Hernias - Umbilical hernia - Reducible(Large).    Assessment & Plan Axel Filler(Ankith Edmonston MD; 12/15/2017 1:43 PM) UMBILICAL HERNIA WITHOUT OBSTRUCTION AND WITHOUT GANGRENE (K42.9) Impression: 62 year old male with a incarcerated umbilical hernia. 1. The patient will like to proceed to the operating room for laparoscopic umbilical hernia repair with mesh.  2. I discussed with the patient the signs and symptoms of incarceration and strangulation and the need to proceed to the ER should they occur.  3. I discussed with the patient the risks and benefits of the procedure to include but not limited to: Infection, bleeding, damage to surrounding structures, possible need for further surgery, possible nerve pain, and possible recurrence. The patient was understanding and wishes to proceed.

## 2017-12-28 NOTE — Pre-Procedure Instructions (Addendum)
Austin Gould  12/28/2017      [id number] - [place], [place] - [address] [place] [address] Phone: [phone] Fax: [phone]  [facility] [address] - [place], [place] - [address] [place] [address] Phone: [phone] Fax: [phone]    Your procedure is scheduled on August 26  [facility] to [facility] Admitting at 0530 A.M.  Call this number if you have problems the morning of surgery:  [phone]   Remember:  Do not eat or drink after midnight.      Take these medicines the morning of surgery with A SIP OF WATER  esomeprazole (NEXIUM)  famotidine (PEPCID) Oxycodone tamsulosin (FLOMAX)  7 days prior to surgery STOP taking any Aspirin(unless otherwise instructed by your surgeon), Aleve, Naproxen, Ibuprofen, Motrin, Advil, Goody's, BC's, all herbal medications, fish oil, and all vitamins  Follow your surgeon's instructions on when to stop Asprin.  If no instructions were given by your surgeon then you will need to call the office to get those instructions.     WHAT DO I DO ABOUT MY DIABETES MEDICATION?   [name] Kitchen. Do not take oral diabetes medicines (pills) the morning of surgery. metFORMIN (GLUCOPHAGE)    How to Manage Your Diabetes Before and After Surgery  Why is it important to control my blood sugar before and after surgery? . Improving blood sugar levels before and after surgery helps healing and can limit problems. . A way of improving blood sugar control is eating a healthy diet by: o  Eating less sugar and carbohydrates o  Increasing activity/exercise o  Talking with your doctor about reaching your blood sugar goals . High blood sugars (greater than 180 mg/dL) can raise your risk of infections and slow your recovery, so you will need to focus on controlling your diabetes during the weeks before surgery. . Make sure that the  doctor who takes care of your diabetes knows about your planned surgery including the date and location.  How do I manage my blood sugar before surgery? . Check your blood sugar at least 4 times a day, starting 2 days before surgery, to make sure that the level is not too high or low. o Check your blood sugar the morning of your surgery when you wake up and every 2 hours until you get to the Short Stay unit. . If your blood sugar is less than 70 mg/dL, you will need to treat for low blood sugar: o Do not take insulin. o Treat a low blood sugar (less than 70 mg/dL) with  cup of clear juice (cranberry or apple), 4 glucose tablets, OR glucose gel. o Recheck blood sugar in 15 minutes after treatment (to make sure it is greater than 70 mg/dL). If your blood sugar is not greater than 70 mg/dL on recheck, call [phone] for further instructions. . Report your blood sugar to the short stay nurse when you get to Short Stay.  . If you are admitted to the hospital after surgery: o Your blood sugar will be checked by the staff and you will probably be given insulin after surgery (instead of oral diabetes medicines) to make sure you have good blood sugar levels. o The goal for blood sugar control after surgery is 80-180 mg/dL.    Do not wear jewelry  Do not wear lotions, powders, or cologne, or deodorant.  Men may shave  face and neck.  Do not bring valuables to the hospital.  [facility] is not responsible for any belongings or valuables.  Contacts, dentures or bridgework may not be worn into surgery.  Leave your suitcase in the car.  After surgery it may be brought to your room.  For patients admitted to the hospital, discharge time will be determined by your treatment team.  Patients discharged the day of surgery will not be allowed to drive home.    Special instructions:   [facility]- Preparing For Surgery  Before surgery, you can play an important role. Because skin is not sterile, your  skin needs to be as free of germs as possible. You can reduce the number of germs on your skin by washing with CHG (chlorahexidine gluconate) Soap before surgery.  CHG is an antiseptic cleaner which kills germs and bonds with the skin to continue killing germs even after washing.    Oral Hygiene is also important to reduce your risk of infection.  Remember - BRUSH YOUR TEETH THE MORNING OF SURGERY WITH YOUR REGULAR TOOTHPASTE  Please do not use if you have an allergy to CHG or antibacterial soaps. If your skin becomes reddened/irritated stop using the CHG.  Do not shave (including legs and underarms) for at least 48 hours prior to first CHG shower. It is OK to shave your face.  Please follow these instructions carefully.   1. Shower the NIGHT BEFORE SURGERY and the MORNING OF SURGERY with CHG.   2. If you chose to wash your hair, wash your hair first as usual with your normal shampoo.  3. After you shampoo, rinse your hair and body thoroughly to remove the shampoo.  4. Use CHG as you would any other liquid soap. You can apply CHG directly to the skin and wash gently with a scrungie or a clean washcloth.   5. Apply the CHG Soap to your body ONLY FROM THE NECK DOWN.  Do not use on open wounds or open sores. Avoid contact with your eyes, ears, mouth and genitals (private parts). Wash Face and genitals (private parts)  with your normal soap.  6. Wash thoroughly, paying special attention to the area where your surgery will be performed.  7. Thoroughly rinse your body with warm water from the neck down.  8. DO NOT shower/wash with your normal soap after using and rinsing off the CHG Soap.  9. Pat yourself dry with a CLEAN TOWEL.  10. Wear CLEAN PAJAMAS to bed the night before surgery, wear comfortable clothes the morning of surgery  11. Place CLEAN SHEETS on your bed the night of your first shower and DO NOT SLEEP WITH PETS.    Day of Surgery:  Do not apply any deodorants/lotions.   Please wear clean clothes to the hospital/surgery center.   Remember to brush your teeth WITH YOUR REGULAR TOOTHPASTE.    Please read over the following fact sheets that you were given.

## 2017-12-29 ENCOUNTER — Other Ambulatory Visit: Payer: Self-pay

## 2017-12-29 ENCOUNTER — Encounter (HOSPITAL_COMMUNITY): Payer: Self-pay

## 2017-12-29 ENCOUNTER — Encounter (HOSPITAL_COMMUNITY)
Admission: RE | Admit: 2017-12-29 | Discharge: 2017-12-29 | Disposition: A | Discharge status: Home / Self Care | Payer: Managed Care, Other (non HMO) | Source: Ambulatory Visit | Attending: General Surgery | Admitting: General Surgery

## 2017-12-29 DIAGNOSIS — Z01812 Encounter for preprocedural laboratory examination: Secondary | ICD-10-CM | POA: Diagnosis present

## 2017-12-29 DIAGNOSIS — Z01810 Encounter for preprocedural cardiovascular examination: Secondary | ICD-10-CM | POA: Insufficient documentation

## 2017-12-29 LAB — BASIC METABOLIC PANEL
ANION GAP: 15 (ref 5–15)
BUN: 14 mg/dL (ref 6–20)
CALCIUM: 9.5 mg/dL (ref 8.9–10.3)
CO2: 18 mmol/L — ABNORMAL LOW (ref 22–32)
Chloride: 103 mmol/L (ref 98–111)
Creatinine, Ser: 1.26 mg/dL — ABNORMAL HIGH (ref 0.61–1.24)
Glucose, Bld: 176 mg/dL — ABNORMAL HIGH (ref 70–99)
Potassium: 4.5 mmol/L (ref 3.5–5.1)
SODIUM: 136 mmol/L (ref 135–145)

## 2017-12-29 LAB — CBC
HCT: 46.8 % (ref 39.0–52.0)
Hemoglobin: 14.2 g/dL (ref 13.0–17.0)
MCH: 27 pg (ref 26.0–34.0)
MCHC: 30.3 g/dL (ref 30.0–36.0)
MCV: 89 fL (ref 78.0–100.0)
PLATELETS: 297 10*3/uL (ref 150–400)
RBC: 5.26 MIL/uL (ref 4.22–5.81)
RDW: 14.3 % (ref 11.5–15.5)
WBC: 9.1 10*3/uL (ref 4.0–10.5)

## 2017-12-29 LAB — HEMOGLOBIN A1C
Hgb A1c MFr Bld: 9.8 % — ABNORMAL HIGH (ref 4.8–5.6)
MEAN PLASMA GLUCOSE: 234.56 mg/dL

## 2017-12-29 LAB — GLUCOSE, CAPILLARY: Glucose-Capillary: 185 mg/dL — ABNORMAL HIGH (ref 70–99)

## 2017-12-29 NOTE — Progress Notes (Signed)
Pharmacy tech paged for completion of med rec

## 2017-12-29 NOTE — Progress Notes (Signed)
PCP - Benedetto GoadFred Wilson Cardiologist - denies  Chest x-ray - not needed EKG - 12/29/17 Stress Test - denies ECHO - denies Cardiac Cath - denies  Sleep Study -  CPAP -   Fasting Blood Sugar - doesn't really know Checks Blood Sugar __1___ every other day  Aspirin Instructions: stop 7 days  Anesthesia review: NO  Patient denies shortness of breath, fever, cough and chest pain at PAT appointment   Patient verbalized understanding of instructions that were given to them at the PAT appointment. Patient was also instructed that they will need to review over the PAT instructions again at home before surgery.

## 2018-01-08 ENCOUNTER — Encounter (HOSPITAL_COMMUNITY)
Admission: RE | Disposition: A | Discharge status: Home / Self Care | Payer: Self-pay | Source: Ambulatory Visit | Attending: General Surgery

## 2018-01-08 ENCOUNTER — Ambulatory Visit (HOSPITAL_COMMUNITY)
Admission: RE | Admit: 2018-01-08 | Discharge: 2018-01-08 | Disposition: A | Discharge status: Home / Self Care | Payer: Managed Care, Other (non HMO) | Source: Ambulatory Visit | Attending: General Surgery | Admitting: General Surgery

## 2018-01-08 ENCOUNTER — Ambulatory Visit (HOSPITAL_COMMUNITY): Payer: Managed Care, Other (non HMO) | Admitting: Physician Assistant

## 2018-01-08 ENCOUNTER — Ambulatory Visit (HOSPITAL_COMMUNITY): Payer: Managed Care, Other (non HMO) | Admitting: Certified Registered"

## 2018-01-08 ENCOUNTER — Encounter (HOSPITAL_COMMUNITY): Payer: Self-pay | Admitting: Anesthesiology

## 2018-01-08 DIAGNOSIS — E7800 Pure hypercholesterolemia, unspecified: Secondary | ICD-10-CM | POA: Diagnosis not present

## 2018-01-08 DIAGNOSIS — Z79899 Other long term (current) drug therapy: Secondary | ICD-10-CM | POA: Diagnosis not present

## 2018-01-08 DIAGNOSIS — Z7984 Long term (current) use of oral hypoglycemic drugs: Secondary | ICD-10-CM | POA: Diagnosis not present

## 2018-01-08 DIAGNOSIS — E119 Type 2 diabetes mellitus without complications: Secondary | ICD-10-CM | POA: Insufficient documentation

## 2018-01-08 DIAGNOSIS — I10 Essential (primary) hypertension: Secondary | ICD-10-CM | POA: Diagnosis not present

## 2018-01-08 DIAGNOSIS — K420 Umbilical hernia with obstruction, without gangrene: Secondary | ICD-10-CM | POA: Diagnosis present

## 2018-01-08 HISTORY — PX: UMBILICAL HERNIA REPAIR: SHX196

## 2018-01-08 HISTORY — PX: INSERTION OF MESH: SHX5868

## 2018-01-08 LAB — GLUCOSE, CAPILLARY
Glucose-Capillary: 165 mg/dL — ABNORMAL HIGH (ref 70–99)
Glucose-Capillary: 167 mg/dL — ABNORMAL HIGH (ref 70–99)

## 2018-01-08 SURGERY — REPAIR, HERNIA, UMBILICAL, LAPAROSCOPIC
Anesthesia: General

## 2018-01-08 MED ORDER — SUCCINYLCHOLINE CHLORIDE 200 MG/10ML IV SOSY
PREFILLED_SYRINGE | INTRAVENOUS | Status: DC | PRN
  Administered 2018-01-08: 100 mg via INTRAVENOUS

## 2018-01-08 MED ORDER — LACTATED RINGERS IV SOLN: INTRAVENOUS | Status: DC

## 2018-01-08 MED ORDER — FENTANYL CITRATE (PF) 100 MCG/2ML IJ SOLN
INTRAMUSCULAR | Status: DC | PRN
  Administered 2018-01-08 (×2): 50 ug via INTRAVENOUS

## 2018-01-08 MED ORDER — 0.9 % SODIUM CHLORIDE (POUR BTL) OPTIME
TOPICAL | Status: DC | PRN
  Administered 2018-01-08: 1000 mL

## 2018-01-08 MED ORDER — MIDAZOLAM HCL 2 MG/2ML IJ SOLN
INTRAMUSCULAR | Status: AC
  Filled 2018-01-08: qty 2

## 2018-01-08 MED ORDER — ACETAMINOPHEN 10 MG/ML IV SOLN
INTRAVENOUS | Status: AC
  Filled 2018-01-08: qty 100

## 2018-01-08 MED ORDER — PROPOFOL 10 MG/ML IV BOLUS
INTRAVENOUS | Status: DC | PRN
  Administered 2018-01-08: 150 mg via INTRAVENOUS

## 2018-01-08 MED ORDER — LIDOCAINE 2% (20 MG/ML) 5 ML SYRINGE
INTRAMUSCULAR | Status: DC | PRN
  Administered 2018-01-08: 80 mg via INTRAVENOUS

## 2018-01-08 MED ORDER — TRAMADOL HCL 50 MG PO TABS
50.0000 mg | ORAL_TABLET | Freq: Once | ORAL | Status: AC
  Administered 2018-01-08: 50 mg via ORAL

## 2018-01-08 MED ORDER — PHENYLEPHRINE HCL 10 MG/ML IJ SOLN
INTRAMUSCULAR | Status: DC | PRN
  Administered 2018-01-08 (×2): 200 ug via INTRAVENOUS

## 2018-01-08 MED ORDER — ONDANSETRON HCL 4 MG/2ML IJ SOLN
INTRAMUSCULAR | Status: DC | PRN
  Administered 2018-01-08: 4 mg via INTRAVENOUS

## 2018-01-08 MED ORDER — LACTATED RINGERS IV SOLN
INTRAVENOUS | Status: DC
  Administered 2018-01-08 (×2): via INTRAVENOUS

## 2018-01-08 MED ORDER — BUPIVACAINE HCL (PF) 0.25 % IJ SOLN
INTRAMUSCULAR | Status: AC
  Filled 2018-01-08: qty 30

## 2018-01-08 MED ORDER — HYDROMORPHONE HCL 1 MG/ML IJ SOLN
INTRAMUSCULAR | Status: AC
  Filled 2018-01-08: qty 1

## 2018-01-08 MED ORDER — GABAPENTIN 300 MG PO CAPS
300.0000 mg | ORAL_CAPSULE | ORAL | Status: AC
  Administered 2018-01-08: 300 mg via ORAL

## 2018-01-08 MED ORDER — FENTANYL CITRATE (PF) 250 MCG/5ML IJ SOLN
INTRAMUSCULAR | Status: AC
  Filled 2018-01-08: qty 5

## 2018-01-08 MED ORDER — SODIUM CHLORIDE 0.9 % IV SOLN
INTRAVENOUS | Status: DC | PRN
  Administered 2018-01-08: 50 ug/min via INTRAVENOUS

## 2018-01-08 MED ORDER — CEFAZOLIN SODIUM-DEXTROSE 2-4 GM/100ML-% IV SOLN
2.0000 g | INTRAVENOUS | Status: AC
  Administered 2018-01-08: 2 g via INTRAVENOUS

## 2018-01-08 MED ORDER — TRAMADOL HCL 50 MG PO TABS
ORAL_TABLET | ORAL | Status: AC
  Filled 2018-01-08: qty 1

## 2018-01-08 MED ORDER — DEXAMETHASONE SODIUM PHOSPHATE 10 MG/ML IJ SOLN
INTRAMUSCULAR | Status: AC
  Filled 2018-01-08: qty 1

## 2018-01-08 MED ORDER — ACETAMINOPHEN 10 MG/ML IV SOLN
1000.0000 mg | Freq: Once | INTRAVENOUS | Status: AC
  Administered 2018-01-08: 1000 mg via INTRAVENOUS

## 2018-01-08 MED ORDER — MEPERIDINE HCL 50 MG/ML IJ SOLN: 6.2500 mg | INTRAMUSCULAR | Status: DC | PRN

## 2018-01-08 MED ORDER — SUGAMMADEX SODIUM 200 MG/2ML IV SOLN
INTRAVENOUS | Status: DC | PRN
  Administered 2018-01-08: 180 mg via INTRAVENOUS

## 2018-01-08 MED ORDER — MIDAZOLAM HCL 2 MG/2ML IJ SOLN
INTRAMUSCULAR | Status: DC | PRN
  Administered 2018-01-08: 2 mg via INTRAVENOUS

## 2018-01-08 MED ORDER — ACETAMINOPHEN 500 MG PO TABS: 1000.0000 mg | ORAL_TABLET | ORAL | Status: DC

## 2018-01-08 MED ORDER — ACETAMINOPHEN 500 MG PO TABS
ORAL_TABLET | ORAL | Status: AC
  Filled 2018-01-08: qty 2

## 2018-01-08 MED ORDER — TRAMADOL HCL 50 MG PO TABS: 50.0000 mg | ORAL_TABLET | Freq: Four times a day (QID) | ORAL | 0 refills | Status: AC | PRN

## 2018-01-08 MED ORDER — BUPIVACAINE HCL 0.25 % IJ SOLN
INTRAMUSCULAR | Status: DC | PRN
  Administered 2018-01-08: 8 mL

## 2018-01-08 MED ORDER — PROMETHAZINE HCL 25 MG/ML IJ SOLN: 6.2500 mg | INTRAMUSCULAR | Status: DC | PRN

## 2018-01-08 MED ORDER — GABAPENTIN 300 MG PO CAPS
ORAL_CAPSULE | ORAL | Status: AC
  Administered 2018-01-08: 300 mg via ORAL
  Filled 2018-01-08: qty 1

## 2018-01-08 MED ORDER — DEXAMETHASONE SODIUM PHOSPHATE 10 MG/ML IJ SOLN
INTRAMUSCULAR | Status: DC | PRN
  Administered 2018-01-08: 10 mg via INTRAVENOUS

## 2018-01-08 MED ORDER — ROCURONIUM BROMIDE 10 MG/ML (PF) SYRINGE
PREFILLED_SYRINGE | INTRAVENOUS | Status: DC | PRN
  Administered 2018-01-08: 30 mg via INTRAVENOUS
  Administered 2018-01-08: 10 mg via INTRAVENOUS

## 2018-01-08 MED ORDER — CEFAZOLIN SODIUM-DEXTROSE 2-4 GM/100ML-% IV SOLN
INTRAVENOUS | Status: AC
  Filled 2018-01-08: qty 100

## 2018-01-08 MED ORDER — PROPOFOL 10 MG/ML IV BOLUS
INTRAVENOUS | Status: AC
  Filled 2018-01-08: qty 20

## 2018-01-08 MED ORDER — HYDROMORPHONE HCL 1 MG/ML IJ SOLN
0.2500 mg | INTRAMUSCULAR | Status: DC | PRN
  Administered 2018-01-08 (×4): 0.5 mg via INTRAVENOUS

## 2018-01-08 MED ORDER — CHLORHEXIDINE GLUCONATE CLOTH 2 % EX PADS: 6.0000 | MEDICATED_PAD | Freq: Once | CUTANEOUS | Status: DC

## 2018-01-08 SURGICAL SUPPLY — 40 items
BLADE CLIPPER SURG (BLADE) ×3 IMPLANT
CANISTER SUCT 3000ML PPV (MISCELLANEOUS) IMPLANT
CHLORAPREP W/TINT 26ML (MISCELLANEOUS) ×3 IMPLANT
COVER SURGICAL LIGHT HANDLE (MISCELLANEOUS) ×3 IMPLANT
DECANTER SPIKE VIAL GLASS SM (MISCELLANEOUS) ×3 IMPLANT
DERMABOND ADVANCED (GAUZE/BANDAGES/DRESSINGS) ×2
DERMABOND ADVANCED .7 DNX12 (GAUZE/BANDAGES/DRESSINGS) ×1 IMPLANT
DEVICE SECURE STRAP 25 ABSORB (INSTRUMENTS) ×6 IMPLANT
DEVICE TROCAR PUNCTURE CLOSURE (ENDOMECHANICALS) ×3 IMPLANT
ELECT REM PT RETURN 9FT ADLT (ELECTROSURGICAL) ×3
ELECTRODE REM PT RTRN 9FT ADLT (ELECTROSURGICAL) ×1 IMPLANT
GLOVE BIO SURGEON STRL SZ7.5 (GLOVE) ×3 IMPLANT
GOWN STRL REUS W/ TWL LRG LVL3 (GOWN DISPOSABLE) ×2 IMPLANT
GOWN STRL REUS W/ TWL XL LVL3 (GOWN DISPOSABLE) ×1 IMPLANT
GOWN STRL REUS W/TWL LRG LVL3 (GOWN DISPOSABLE) ×4
GOWN STRL REUS W/TWL XL LVL3 (GOWN DISPOSABLE) ×2
KIT BASIN OR (CUSTOM PROCEDURE TRAY) ×3 IMPLANT
KIT TURNOVER KIT B (KITS) ×3 IMPLANT
MARKER SKIN DUAL TIP RULER LAB (MISCELLANEOUS) ×3 IMPLANT
MESH VENTRALIGHT ST 6IN CRC (Mesh General) ×3 IMPLANT
NEEDLE INSUFFLATION 14GA 120MM (NEEDLE) ×6 IMPLANT
NEEDLE SPNL 22GX3.5 QUINCKE BK (NEEDLE) IMPLANT
NS IRRIG 1000ML POUR BTL (IV SOLUTION) ×3 IMPLANT
PAD ARMBOARD 7.5X6 YLW CONV (MISCELLANEOUS) ×6 IMPLANT
SCISSORS LAP 5X35 DISP (ENDOMECHANICALS) ×3 IMPLANT
SET IRRIG TUBING LAPAROSCOPIC (IRRIGATION / IRRIGATOR) IMPLANT
SLEEVE ENDOPATH XCEL 5M (ENDOMECHANICALS) ×3 IMPLANT
SUT CHROMIC 2 0 SH (SUTURE) ×3 IMPLANT
SUT ETHIBOND 0 MO6 C/R (SUTURE) IMPLANT
SUT MNCRL AB 4-0 PS2 18 (SUTURE) ×3 IMPLANT
SUT NOVA 1 T20/GS 25DT (SUTURE) IMPLANT
SUT PROLENE 2 0 KS (SUTURE) ×6 IMPLANT
TOWEL OR 17X24 6PK STRL BLUE (TOWEL DISPOSABLE) ×3 IMPLANT
TOWEL OR 17X26 10 PK STRL BLUE (TOWEL DISPOSABLE) IMPLANT
TRAY LAPAROSCOPIC MC (CUSTOM PROCEDURE TRAY) ×3 IMPLANT
TROCAR XCEL BLUNT TIP 100MML (ENDOMECHANICALS) IMPLANT
TROCAR XCEL NON-BLD 11X100MML (ENDOMECHANICALS) IMPLANT
TROCAR XCEL NON-BLD 5MMX100MML (ENDOMECHANICALS) ×3 IMPLANT
TUBING INSUFFLATION (TUBING) ×3 IMPLANT
WATER STERILE IRR 1000ML POUR (IV SOLUTION) ×3 IMPLANT

## 2018-01-08 NOTE — Transfer of Care (Signed)
Immediate Anesthesia Transfer of Care Note  Patient: Austin Gould  Procedure(s) Performed: LAPAROSCOPIC UMBILICAL HERNIA REPAIR WITH MESH (N/A ) INSERTION OF MESH (N/A )  Patient Location: PACU  Anesthesia Type:General  Level of Consciousness: drowsy  Airway & Oxygen Therapy: Patient Spontanous Breathing and Patient connected to nasal cannula oxygen  Post-op Assessment: Report given to RN, Post -op Vital signs reviewed and stable and Patient moving all extremities  Post vital signs: Reviewed and stable  Last Vitals:  Vitals Value Taken Time  BP 136/75 01/08/2018  2:16 PM  Temp    Pulse 89 01/08/2018  2:17 PM  Resp 21 01/08/2018  2:17 PM  SpO2 95 % 01/08/2018  2:17 PM  Vitals shown include unvalidated device data.  Last Pain:  Vitals:   01/08/18 1126  TempSrc: Oral         Complications: No apparent anesthesia complications

## 2018-01-08 NOTE — Op Note (Signed)
01/08/2018  2:06 PM  PATIENT:  Austin Gould  62 y.o. male  PRE-OPERATIVE DIAGNOSIS:  UMBILICAL HERNIA  POST-OPERATIVE DIAGNOSIS:  Incarcerated umbilical hernia  PROCEDURE:  Procedure(s): LAPAROSCOPIC UMBILICAL HERNIA REPAIR WITH MESH (N/A) INSERTION OF MESH (N/A)  SURGEON:  Surgeon(s) and Role:    * Axel Filleramirez, Ibrahima Holberg, MD - Primary  ANESTHESIA:   local and general  EBL:  <5cc  BLOOD ADMINISTERED:none  DRAINS: none   LOCAL MEDICATIONS USED:  BUPIVICAINE   SPECIMEN:  No Specimen  DISPOSITION OF SPECIMEN:  PATHOLOGY  COUNTS:  YES  TOURNIQUET:  * No tourniquets in log *  DICTATION: .Dragon Dictation   Details of the procedure:   After the patient was consented patient was taken back to the operating room patient was then placed in supine position bilateral SCDs in place.  The patient was prepped and draped in the usual sterile fashion. After antibiotics were confirmed a timeout was called and all facts were verified. The Veress needle technique was used to insuflate the abdomen at Palmer's point. The abdomen was insufflated to 14 mm mercury. Subsequently a 5 mm trocar was placed a camera inserted there was no injury to any intra-abdominal organs.    There was seen to be an incarcerated umbilical hernia containing omentum.  A second camera port was in placed into the left lower quadrant.   At this the Falicform ligament was taken down with Bovie cautery maintaining hemostasis.  I proceeded to reduce the hernia contents.  The hernia sac was dissected out of the hernia and disposed.  The fascia at the hernia was reapproximated using a #1Ethibonds x 2.  Once the hernia was cleared away, a Bard Ventralight 15.2cm  mesh was inserted into the abdomen.  The mesh was secured circumferentially with am Securestrap tacker in a double crown fashion.  2-0 prolenes were used at 3:00, 6:00, 9:00, and 12:00 as transfascial sutures using a endoclose decive.    The omentum was brought over the  area of the mesh. The pneumoperitoneum was evacuated  & all trocars  were removed. The skin was reapproximated with 4-0  Monocryl sutures in a subcuticular fashion. The skin was dressed with Steri-Strips tape and gauze.  The patient was taken to the recovery room in stable condition.  Type of repair -primary suture & mesh  Mesh overlap - 6cm  Placement of mesh -  beneath fascia and into peritoneal cavity   PLAN OF CARE: Discharge to home after PACU  PATIENT DISPOSITION:  PACU - hemodynamically stable.   Delay start of Pharmacological VTE agent (>24hrs) due to surgical blood loss or risk of bleeding: not applicable

## 2018-01-08 NOTE — Discharge Instructions (Signed)
[facility] ° °HERNIA REPAIR: POST OP INSTRUCTIONS ° °Always review your discharge instruction sheet given to you by the facility where your surgery was performed. °IF YOU HAVE DISABILITY OR FAMILY LEAVE FORMS, YOU MUST BRING THEM TO THE OFFICE FOR PROCESSING.   °DO NOT GIVE THEM TO YOUR DOCTOR. ° °1. A  prescription for pain medication may be given to you upon discharge.  Take your pain medication as prescribed, if needed.  If narcotic pain medicine is not needed, then you may take acetaminophen (Tylenol) or ibuprofen (Advil) as needed. °2. Take your usually prescribed medications unless otherwise directed. °If you need a refill on your pain medication, please contact your pharmacy.  They will contact our office to request authorization. Prescriptions will not be filled after 5 pm or on week-ends. °3. You should follow a light diet the first 24 hours after arrival home, such as soup and crackers, etc.  Be sure to include lots of fluids daily.  Resume your normal diet the day after surgery. °4.Most patients will experience some swelling and bruising around the umbilicus or in the groin and scrotum.  Ice packs and reclining will help.  Swelling and bruising can take several days to resolve.  °6. It is common to experience some constipation if taking pain medication after surgery.  Increasing fluid intake and taking a stool softener (such as Colace) will usually help or prevent this problem from occurring.  A mild laxative (Milk of Magnesia or Miralax) should be taken according to package directions if there are no bowel movements after 48 hours. °7. Unless discharge instructions indicate otherwise, you may remove your bandages 24-48 hours after surgery, and you may shower at that time.  You may have steri-strips (small skin tapes) in place directly over the incision.  These strips should be left on the skin for 7-10 days.  If your surgeon used skin glue on the incision, you may shower in  24 hours.  The glue will flake off over the next 2-3 weeks.  Any sutures or staples will be removed at the office during your follow-up visit. °8. ACTIVITIES:  You may resume regular (light) daily activities beginning the next day--such as daily self-care, walking, climbing stairs--gradually increasing activities as tolerated.  You may have sexual intercourse when it is comfortable.  Refrain from any heavy lifting or straining until approved by your doctor. ° °a.You may drive when you are no longer taking prescription pain medication, you can comfortably wear a seatbelt, and you can safely maneuver your car and apply brakes. °b.RETURN TO WORK:   °_____________________________________________ ° °9.You should see your doctor in the office for a follow-up appointment approximately 2-3 weeks after your surgery.  Make sure that you call for this appointment within a day or two after you arrive home to insure a convenient appointment time. °10.OTHER INSTRUCTIONS: _________________________ °   _____________________________________ ° °WHEN TO CALL YOUR DOCTOR: °1. Fever over 101.0 °2. Inability to urinate °3. Nausea and/or vomiting °4. Extreme swelling or bruising °5. Continued bleeding from incision. °6. Increased pain, redness, or drainage from the incision ° °The clinic staff is available to answer your questions during regular business hours.  Please don’t hesitate to call and ask to speak to one of the nurses for clinical concerns.  If you have a medical emergency, go to the nearest emergency room or call 911.  A surgeon from [facility] is always on call at the hospital ° ° °[address], [address], [place], [place]  [address] ? ° [address] [address], , [place]   [address] °[phone] ? [phone] ? FAX [phone] °Web site: [email or web address] ° °

## 2018-01-08 NOTE — Interval H&P Note (Signed)
History and Physical Interval Note:  01/08/2018 11:50 AM  Austin MaidensPerry L Wickliff  has presented today for surgery, with the diagnosis of UMBILICAL HERNIA  The various methods of treatment have been discussed with the patient and family. After consideration of risks, benefits and other options for treatment, the patient has consented to  Procedure(s): LAPAROSCOPIC UMBILICAL HERNIA REPAIR WITH MESH (N/A) INSERTION OF MESH (N/A) as a surgical intervention .  The patient's history has been reviewed, patient examined, no change in status, stable for surgery.  I have reviewed the patient's chart and labs.  Questions were answered to the patient's satisfaction.     Marigene Ehlersamirez Jr., Jed LimerickArmando

## 2018-01-08 NOTE — Anesthesia Preprocedure Evaluation (Addendum)
Anesthesia Evaluation  Patient identified by MRN, date of birth, ID band Patient awake    Reviewed: Allergy & Precautions, NPO status , Patient's Chart, lab work & pertinent test results  Airway Mallampati: II  TM Distance: >3 FB Neck ROM: Full    Dental  (+) Dental Advisory Given, Chipped, Missing,    Pulmonary former smoker,    breath sounds clear to auscultation       Cardiovascular hypertension, Pt. on medications  Rhythm:Regular Rate:Normal     Neuro/Psych negative neurological ROS     GI/Hepatic Neg liver ROS, GERD  Medicated,  Endo/Other  diabetes, Type 2, Oral Hypoglycemic Agents  Renal/GU      Musculoskeletal   Abdominal (+) + obese,   Peds  Hematology   Anesthesia Other Findings - HLD  Reproductive/Obstetrics                            Lab Results  Component Value Date   WBC 9.1 12/29/2017   HGB 14.2 12/29/2017   HCT 46.8 12/29/2017   MCV 89.0 12/29/2017   PLT 297 12/29/2017   Lab Results  Component Value Date   CREATININE 1.26 (H) 12/29/2017   BUN 14 12/29/2017   NA 136 12/29/2017   K 4.5 12/29/2017   CL 103 12/29/2017   CO2 18 (L) 12/29/2017   No results found for: INR, PROTIME  EKG: normal sinus rhythm.  Anesthesia Physical Anesthesia Plan  ASA: II  Anesthesia Plan: General   Post-op Pain Management:    Induction: Intravenous  PONV Risk Score and Plan: 3 and Ondansetron, Dexamethasone and Midazolam  Airway Management Planned: Oral ETT  Additional Equipment: None  Intra-op Plan:   Post-operative Plan: Extubation in OR  Informed Consent: I have reviewed the patients History and Physical, chart, labs and discussed the procedure including the risks, benefits and alternatives for the proposed anesthesia with the patient or authorized representative who has indicated his/her understanding and acceptance.   Dental advisory given  Plan Discussed with:  CRNA  Anesthesia Plan Comments:         Anesthesia Quick Evaluation

## 2018-01-09 ENCOUNTER — Encounter (HOSPITAL_COMMUNITY): Payer: Self-pay | Admitting: General Surgery

## 2018-01-09 NOTE — Anesthesia Postprocedure Evaluation (Addendum)
Anesthesia Post Note  Patient: Halina Maidenserry L Passarella  Procedure(s) Performed: LAPAROSCOPIC UMBILICAL HERNIA REPAIR WITH MESH (N/A ) INSERTION OF MESH (N/A )     Patient location during evaluation: PACU Anesthesia Type: General Level of consciousness: awake and alert Pain management: pain level controlled Vital Signs Assessment: post-procedure vital signs reviewed and stable Respiratory status: spontaneous breathing, nonlabored ventilation, respiratory function stable and patient connected to nasal cannula oxygen Cardiovascular status: blood pressure returned to baseline and stable Postop Assessment: no apparent nausea or vomiting Anesthetic complications: no    Last Vitals:  Vitals:   01/08/18 1700 01/08/18 1728  BP: 139/81 (!) 147/76  Pulse: 87 76  Resp: (!) 27 18  Temp: (!) 36.3 C   SpO2: 97% 100%    Last Pain:  Vitals:   01/08/18 1728  TempSrc:   PainSc: 3                  Shelton SilvasKevin D Davy Westmoreland

## 2018-01-21 IMAGING — DX DG ABDOMEN 1V
1 series · 1 of 1 positions shown · non-contrast
Comparison: KUB October 11, 2016

CLINICAL DATA: Lithotripsy plan today.  History of kidney stones.

EXAM:
ABDOMEN - 1 VIEW

[abdomen kub]
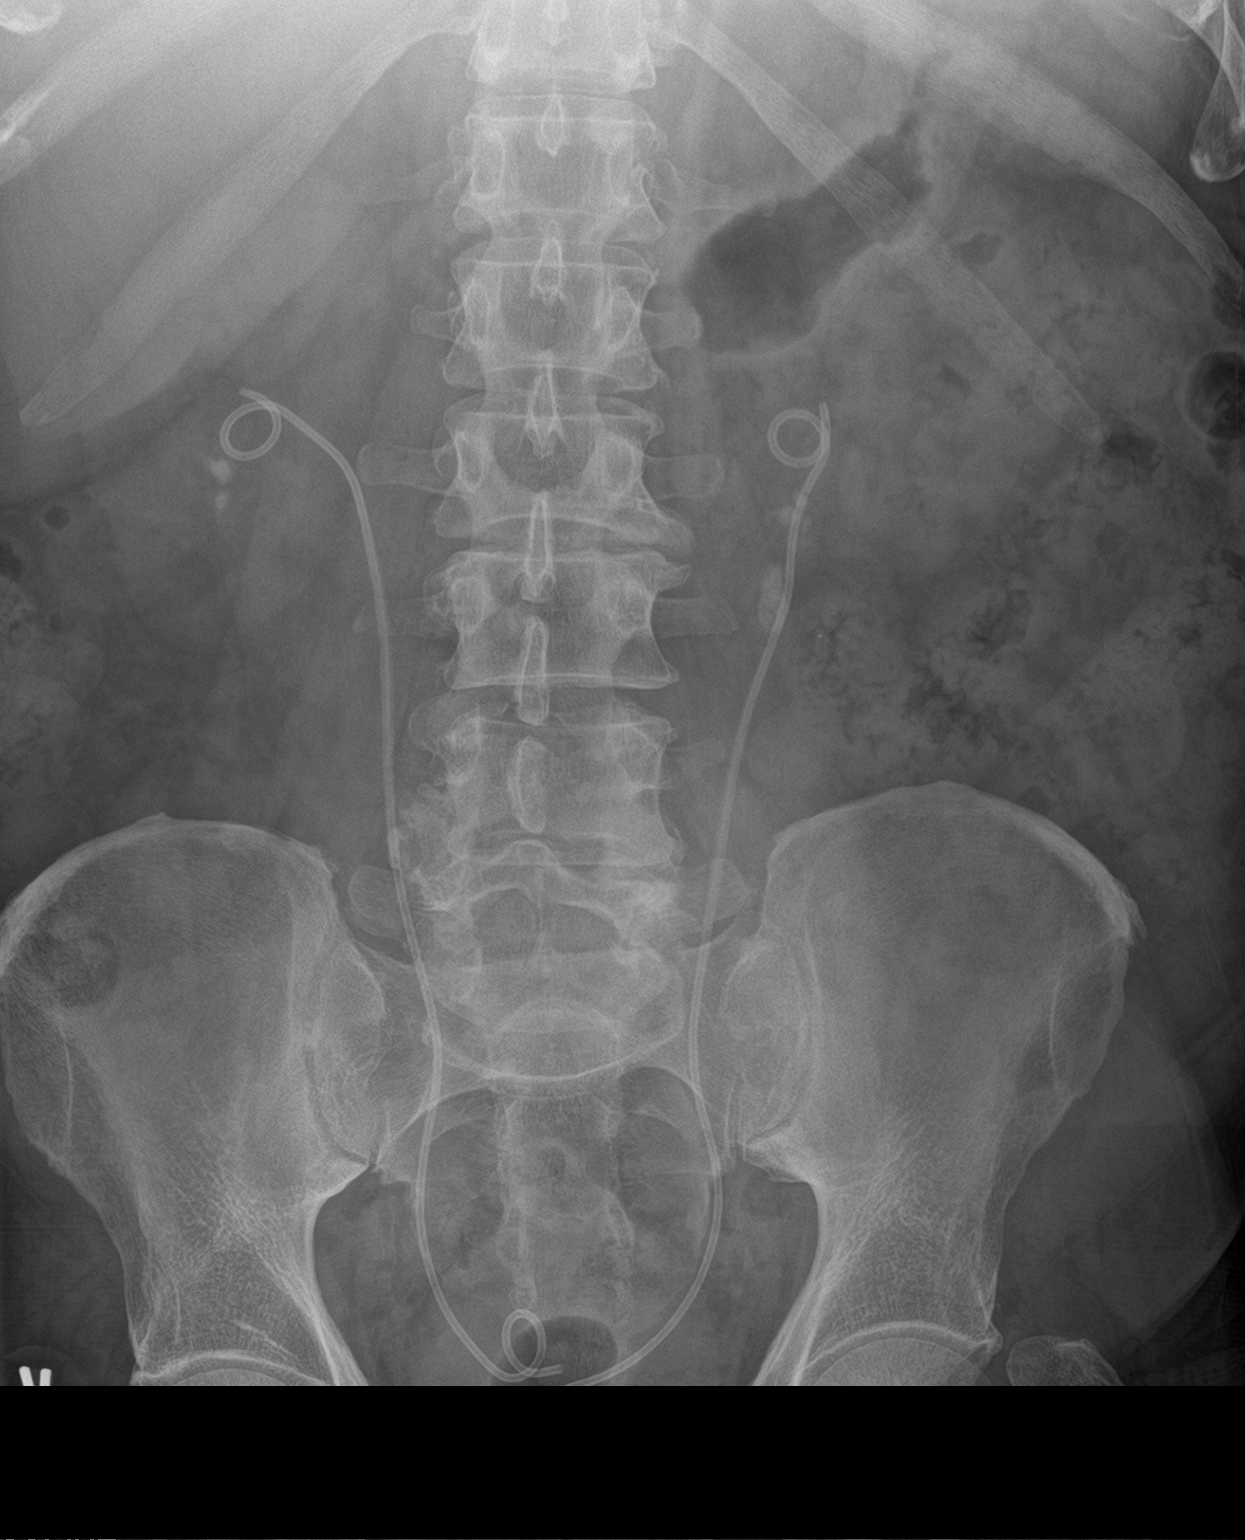

[1 of 1 positions shown; findings below may reference images not displayed]

FINDINGS: Double pigtail stents remain present bilaterally. On the right there
are at least 2 stones measuring up to 7 mm diameter in the lower
pole. On the left there are stones at the UPJ and in the proximal
ureter which appear stable. The largest is elongated and measures
approximately 18 mm in length by approximately 7 mm in width and
lies at the UPJ or the proximal ureter. More distally no stones are
observed. The bowel gas pattern is normal.
IMPRESSION: Bilateral kidney stones. There are coarse stones in the renal pelvis
and proximal left ureter on the left little changed in position
since the previous study.

## 2019-10-18 ENCOUNTER — Other Ambulatory Visit: Payer: Self-pay

## 2019-10-18 DIAGNOSIS — N200 Calculus of kidney: Secondary | ICD-10-CM

## 2020-04-15 ENCOUNTER — Ambulatory Visit: Payer: Managed Care, Other (non HMO) | Admitting: Urology

## 2020-05-01 ENCOUNTER — Other Ambulatory Visit: Payer: Self-pay

## 2020-05-01 ENCOUNTER — Ambulatory Visit (INDEPENDENT_AMBULATORY_CARE_PROVIDER_SITE_OTHER): Payer: Managed Care, Other (non HMO) | Admitting: Urology

## 2020-05-01 ENCOUNTER — Encounter: Payer: Self-pay | Admitting: Urology

## 2020-05-01 VITALS — BP 135/76 | HR 106 | Temp 98.4°F | Ht 63.0 in | Wt 193.0 lb

## 2020-05-01 DIAGNOSIS — N138 Other obstructive and reflux uropathy: Secondary | ICD-10-CM | POA: Insufficient documentation

## 2020-05-01 DIAGNOSIS — R3912 Poor urinary stream: Secondary | ICD-10-CM | POA: Insufficient documentation

## 2020-05-01 DIAGNOSIS — N401 Enlarged prostate with lower urinary tract symptoms: Secondary | ICD-10-CM | POA: Diagnosis not present

## 2020-05-01 DIAGNOSIS — N200 Calculus of kidney: Secondary | ICD-10-CM

## 2020-05-01 LAB — URINALYSIS, ROUTINE W REFLEX MICROSCOPIC
Bilirubin, UA: NEGATIVE
Leukocytes,UA: NEGATIVE
Nitrite, UA: NEGATIVE
Protein,UA: NEGATIVE
Specific Gravity, UA: 1.02 (ref 1.005–1.030)
Urobilinogen, Ur: 0.2 mg/dL (ref 0.2–1.0)
pH, UA: 5 (ref 5.0–7.5)

## 2020-05-01 LAB — MICROSCOPIC EXAMINATION
Renal Epithel, UA: NONE SEEN /hpf
WBC, UA: NONE SEEN /hpf (ref 0–5)

## 2020-05-01 MED ORDER — ALLOPURINOL 300 MG PO TABS: 300.0000 mg | ORAL_TABLET | Freq: Every day | ORAL | 3 refills | Status: DC

## 2020-05-01 MED ORDER — TAMSULOSIN HCL 0.4 MG PO CAPS: 0.4000 mg | ORAL_CAPSULE | Freq: Every day | ORAL | 3 refills | Status: DC

## 2020-05-01 MED ORDER — SODIUM BICARBONATE 650 MG PO TABS: 650.0000 mg | ORAL_TABLET | Freq: Two times a day (BID) | ORAL | 3 refills | Status: DC

## 2020-05-01 NOTE — Progress Notes (Signed)
05/01/2020 1:16 PM   Austin Gould 06/11/1955 [id number]  Referring provider: Barbie Banner, MD [address] [place],  [place] [address]  followup nephrolithiasis and BPH  HPI: Austin Gould is a 64yo here for followup for nephrolithiasis and BPH. He has stable LUTS on flomax. No stone events since last visit. He has sodium bicarb and allopurinol. No recent imaging  His records from [facility] are as follows: I have kidney stones.  HPI: Austin Gould is a 65 year-old male established patient who is here for renal calculi.  The problem is on both sides. He first stated noticing pain on approximately 12/01/2016. This is not his first kidney stone. His first stone was approximately 12/14/1989. He has had more than 5 stones prior to getting this one. He is not currently having flank pain, back pain, groin pain, nausea, vomiting, fever or chills. He has not caught a stone in his urine strainer since his symptoms began.   He has had ureteral stent and ureteroscopy for treatment of his stones in the past.   He underwent B/L stent placement for sepsis and right ureteral calculus followed by B/L URS in 2018. Stone composition Uric Acid 80% and CaOx 20%. He was started on sodium bicarb  daily, allopurinol  daily.   He had some mild left hydronephrosis and dilation of the left proximal ureter on follow-up ultrasound in 2019. CT scan was done February 2020 and this was stable without ureteral stone. Small stone in left lower pole.   Renal ultrasound today stable with some mild left dilation. Calyces are not under tension. Some small stones. No flank pain or stone passage   10/11/2019: No stone events since last visit.     CC: I have symptoms of an enlarged prostate.  HPI: He first noticed the symptoms approximately 03/16/2013. His symptoms have not gotten worse over the last year. He has been treated with Flomax. The patient has never had a surgical procedure for bladder outlet obstruction to  his prostate.   He does not get up at night to urinate. He does not have to wait a long time to start his urinary stream. He does not have to strain or bear down to start his urinary stream. He does have a good size and strength to his urinary stream. He does not dribble at the end of urination. He is not having problems with emptying his bladder well.   His urine has not shut off completely. He has not previously had an indwelling catheter in for more than two weeks at a time.   He has not had a PSA done. He does not have any family members who have been diagnosed with prostate cancer.   He noted improvement in his LUTS while taking flomax for nephrolithiasis in 2018. PSA rose to 4 but he had stents in for stones and may have had a UTI.   He stopped tamsulosin but restarted it in 2019. He did have an umbilical hernia repair done in August of 2019, there were no complications.   He returns and PVR is 44 ml. Prostate was about 60 g on CT.   He drives a truck.   10/11/2019: He has stable mild LUTS on flomax 0.4mg  daily     AUA Symptom Score: Less than 20% of the time he has the sensation of not emptying his bladder completely when finished urinating. Less than 50% of the time he has to urinate again fewer than two hours after  he has finished urinating. Less than 20% of the time he has to start and stop again several times when he urinates. Less than 20% of the time he finds it difficult to postpone urination. Less than 20% of the time he has a weak urinary stream. Less than 20% of the time he has to push or strain to begin urination. He never has to get up to urinate from the time he goes to bed until the time he gets up in the morning.   Calculated AUA Symptom Score: 7    QOL Score: He would feel pleased if he had to live with his urinary condition the way it is now for the rest of his life.   Calculated QOL Symptom Score: 1      PMH: Past Medical History:  Diagnosis Date   Anemia  12/23/2016   IRON SUPPLEMENT   Bilateral ureteral calculi 10/04/2016   Diabetes mellitus    type 2   GERD (gastroesophageal reflux disease)    PRN MEDS as need   History of kidney stones 10/04/2016   bil. per CT   Hyperlipidemia    Hypertension    Umbilical hernia     Surgical History: Past Surgical History:  Procedure Laterality Date   COLONOSCOPY     CYSTOSCOPY W/ URETERAL STENT PLACEMENT Bilateral 10/04/2016   Procedure: CYSTOSCOPY WITH RETROGRADE PYELOGRAM/BILATERAL URETERAL STENT PLACEMENT;  Surgeon: Ihor Gully, MD;  Location: [facility];  Service: Urology;  Laterality: Bilateral;   CYSTOSCOPY W/ URETERAL STENT PLACEMENT Bilateral 12/23/2016   Procedure: CYSTOSCOPY WITH RETROGRADE PYELOGRAM/URETERAL BILATERAL STENT PLACEMENT;  Surgeon: Malen Gauze, MD;  Location: [facility];  Service: Urology;  Laterality: Bilateral;   CYSTOSCOPY WITH RETROGRADE PYELOGRAM, URETEROSCOPY AND STENT PLACEMENT Bilateral 11/25/2016   Procedure: CYSTOSCOPY BILATERAL STENT REMOVAL  BILATERAL, URETEROSCOPY STONE REMOVAL  AND left  STENT REPLACEMENT with holmium laser;  Surgeon: Ihor Gully, MD;  Location: [facility];  Service: Urology;  Laterality: Bilateral;   CYSTOSCOPY WITH RETROGRADE PYELOGRAM, URETEROSCOPY AND STENT PLACEMENT Bilateral 02/13/2017   Procedure: CYSTOSCOPY WITH RETROGRADE PYELOGRAM, URETEROSCOPY STONE BASKETRY AND STENT REPLACEMENT;  Surgeon: Malen Gauze, MD;  Location: [facility];  Service: Urology;  Laterality: Bilateral;   CYSTOSCOPY WITH URETEROSCOPY  10/19/2011   Procedure: CYSTOSCOPY WITH URETEROSCOPY;  Surgeon: Milford Cage, MD;  Location: [facility];  Service: Urology;  Laterality: Left;  cystoscopy, left ureteroscopy, holmium laser, lithotripsey left ureter stent placement    EXTRACORPOREAL SHOCK WAVE LITHOTRIPSY Left 08/18/2011   EXTRACORPOREAL SHOCK WAVE LITHOTRIPSY Left 10/17/2016   Procedure: LEFT  EXTRACORPOREAL SHOCK WAVE LITHOTRIPSY (ESWL);  Surgeon: Sebastian Ache, MD;  Location: [facility];  Service: Urology;  Laterality: Left;   HOLMIUM LASER APPLICATION Bilateral 11/25/2016   Procedure: HOLMIUM LASER APPLICATION;  Surgeon: Ihor Gully, MD;  Location: [facility];  Service: Urology;  Laterality: Bilateral;   HOLMIUM LASER APPLICATION Bilateral 02/13/2017   Procedure: HOLMIUM LASER APPLICATION;  Surgeon: Malen Gauze, MD;  Location: [facility];  Service: Urology;  Laterality: Bilateral;   INSERTION OF MESH N/A 01/08/2018   Procedure: INSERTION OF MESH;  Surgeon: Axel Filler, MD;  Location: [facility];  Service: General;  Laterality: N/A;   UMBILICAL HERNIA REPAIR N/A 01/08/2018   Procedure: LAPAROSCOPIC UMBILICAL HERNIA REPAIR WITH MESH;  Surgeon: Axel Filler, MD;  Location: [facility];  Service: General;  Laterality: N/A;    Home Medications:  Allergies as of 05/01/2020  Reactions   Ciprofloxacin Hives   Sulfamethoxazole-trimethoprim Hives      Medication List       Accurate as of May 01, 2020  1:16 PM. If you have any questions, ask your nurse or doctor.        STOP taking these medications   Oxycodone HCl 10 MG Tabs Stopped by: Wilkie AyePatrick Tykeria Wawrzyniak, MD   phenazopyridine 200 MG tablet Commonly known as: Pyridium Stopped by: Wilkie AyePatrick Citlally Captain, MD   promethazine 25 MG tablet Commonly known as: PHENERGAN Stopped by: Wilkie AyePatrick Rowan Blaker, MD   senna-docusate 8.6-50 MG tablet Commonly known as: Senokot-S Stopped by: Wilkie AyePatrick Daymond Cordts, MD     TAKE these medications   allopurinol 300 MG tablet Commonly known as: ZYLOPRIM Take 300 mg by mouth daily.   aspirin EC 81 MG tablet Take 81 mg by mouth daily.   atorvastatin 40 MG tablet Commonly known as: LIPITOR Take 40 mg by mouth every morning.   esomeprazole 40 MG capsule Commonly known as: NEXIUM Take 40 mg by mouth daily as needed (reflux).   famotidine 20 MG  tablet Commonly known as: PEPCID Take 20 mg by mouth 2 (two) times daily as needed for heartburn. Heart burn   Fish Oil 1000 MG Cpdr Take 1,000 mg by mouth 2 (two) times daily.   GARLIC PO Take 1 capsule by mouth 2 (two) times daily.   Januvia 100 MG tablet Generic drug: sitaGLIPtin Take 100 mg by mouth daily.   metFORMIN 1000 MG tablet Commonly known as: GLUCOPHAGE Take 1,000 mg by mouth 2 (two) times daily with a meal.   olmesartan 40 MG tablet Commonly known as: BENICAR Take 40 mg by mouth every evening.   pioglitazone 15 MG tablet Commonly known as: ACTOS Take 15 mg by mouth daily.   tamsulosin 0.4 MG Caps capsule Commonly known as: FLOMAX Take 1 capsule (0.4 mg total) by mouth daily. To help pass kidney stone fragments.   telmisartan 80 MG tablet Commonly known as: MICARDIS       Allergies:  Allergies  Allergen Reactions   Ciprofloxacin Hives   Sulfamethoxazole-Trimethoprim Hives    Family History: No family history on file.  Social History:  reports that he has quit smoking. His smoking use included cigarettes. He quit after 25.00 years of use. He has never used smokeless tobacco. He reports that he does not drink alcohol and does not use drugs.  ROS: All other review of systems were reviewed and are negative except what is noted above in HPI  Physical Exam: BP 135/76    Pulse (!) 106    Temp 98.4 F (36.9 C)    Ht 5\' 3"  (1.6 m)    Wt 193 lb (87.5 kg)    BMI 34.19 kg/m   Constitutional:  Alert and oriented, No acute distress. HEENT: [place] AT, moist mucus membranes.  Trachea midline, no masses. Cardiovascular: No clubbing, cyanosis, or edema. Respiratory: Normal respiratory effort, no increased work of breathing. GI: Abdomen is soft, nontender, nondistended, no abdominal masses GU: No CVA tenderness.  Lymph: No cervical or inguinal lymphadenopathy. Skin: No rashes, bruises or suspicious lesions. Neurologic: Grossly intact, no focal deficits, moving  all 4 extremities. Psychiatric: Normal mood and affect.  Laboratory Data: Lab Results  Component Value Date   WBC 9.1 12/29/2017   HGB 14.2 12/29/2017   HCT 46.8 12/29/2017   MCV 89.0 12/29/2017   PLT 297 12/29/2017    Lab Results  Component Value Date   CREATININE 1.26 (H)  12/29/2017    No results found for: PSA  No results found for: TESTOSTERONE  Lab Results  Component Value Date   HGBA1C 9.8 (H) 12/29/2017    Urinalysis    Component Value Date/Time   COLORURINE YELLOW 12/23/2016 0115   APPEARANCEUR CLOUDY (A) 12/23/2016 0115   LABSPEC 1.015 12/23/2016 0115   PHURINE 5.0 12/23/2016 0115   GLUCOSEU NEGATIVE 12/23/2016 0115   HGBUR MODERATE (A) 12/23/2016 0115   BILIRUBINUR NEGATIVE 12/23/2016 0115   KETONESUR 5 (A) 12/23/2016 0115   PROTEINUR 30 (A) 12/23/2016 0115   NITRITE POSITIVE (A) 12/23/2016 0115   LEUKOCYTESUR LARGE (A) 12/23/2016 0115    Lab Results  Component Value Date   BACTERIA MANY (A) 12/23/2016    Pertinent Imaging:  Results for orders placed during the hospital encounter of 11/25/16  KUB day of procedure  Narrative CLINICAL DATA:  Bilateral renal colic.  EXAM: ABDOMEN - 1 VIEW  COMPARISON:  November 15, 2016  FINDINGS: Double-J stents are again noted extending from the respective renal pelvis levels to the bladder. There is a calcification at the expected level of the right ureteropelvic junction measuring 7 x 6 mm. There is a calcification on the left at the level of the inferior aspect of L3 measuring 1.2 x 0.5 cm. There are several calcifications in the lower pole of left kidney, largest measuring 7 mm. There is a small amount of prostatic calcification.  There is moderate stool in colon. No bowel dilatation or air-fluid level to suggest bowel obstruction. No free air.  IMPRESSION: Double-J stents in place bilaterally. Proximal ureteral calculi bilaterally. Calculi lower pole left kidney. Several small prostatic calculi  noted. No bowel obstruction or free air evident.   Electronically Signed By: Bretta Bang III M.D. On: 11/25/2016 08:10  No results found for this or any previous visit.  No results found for this or any previous visit.  No results found for this or any previous visit.  No results found for this or any previous visit.  No results found for this or any previous visit.  No results found for this or any previous visit.  Results for orders placed during the hospital encounter of 12/23/16  CT Renal Stone Study  Narrative CLINICAL DATA:  Acute onset of left flank pain, fever, nausea and vomiting. Leukocytosis. Initial encounter.  EXAM: CT ABDOMEN AND PELVIS WITHOUT CONTRAST  TECHNIQUE: Multidetector CT imaging of the abdomen and pelvis was performed following the standard protocol without IV contrast.  COMPARISON:  CT of the abdomen and pelvis performed 10/04/2016  FINDINGS: Lower chest: The visualized lung bases are grossly clear. The visualized portions of the mediastinum are unremarkable.  Hepatobiliary: The liver is unremarkable in appearance. The gallbladder is unremarkable in appearance. The common bile duct remains normal in caliber.  Pancreas: The pancreas is within normal limits.  Spleen: The spleen is unremarkable in appearance.  Adrenals/Urinary Tract: The adrenal glands are unremarkable in appearance.  Moderate to severe left-sided hydronephrosis is noted, with left-sided perinephric stranding. There is distention of the left ureter along its entire course. No distal obstructing stone is seen. This may reflect a recently passed stone.  Mild right-sided hydronephrosis is noted, reflecting an obstructing 8 x 5 mm stone proximally at the right ureteropelvic junction. Scattered nonobstructing stones are noted within the left kidney, measuring up to 5 mm in size.  Stomach/Bowel: The stomach is unremarkable in appearance. The small bowel is within  normal limits. The appendix is not visualized; there  is no evidence for appendicitis. The colon is unremarkable in appearance.  Vascular/Lymphatic: Scattered calcification is seen along the abdominal aorta and its branches. The abdominal aorta is otherwise grossly unremarkable. The inferior vena cava is grossly unremarkable. No retroperitoneal lymphadenopathy is seen. No pelvic sidewall lymphadenopathy is identified.  Reproductive: The bladder is mildly distended and grossly unremarkable. The prostate is enlarged, measuring 5.1 cm in transverse dimension.  Other: A moderate umbilical hernia is noted, containing only fat.  Musculoskeletal: No acute osseous abnormalities are identified. The visualized musculature is unremarkable in appearance.  IMPRESSION: 1. Moderate to severe left-sided hydronephrosis. Distention of the left ureter along its entire course. No distal obstructing stone seen. This may reflect a recently passed stone. 2. Mild right-sided hydronephrosis, reflecting an obstructing 8 x 5 mm stone proximally at the right ureteropelvic junction. Would follow the patient's renal function closely, given bilateral hydronephrosis. 3. Scattered nonobstructing left renal stones measure up to 5 mm in size. 4. Scattered aortic atherosclerosis. 5. Enlarged prostate noted. 6. Moderate umbilical hernia, containing only fat.   Electronically Signed By: Roanna Raider M.D. On: 12/23/2016 06:16   Assessment & Plan:    1. Calculus, renal -RTC 6 months with renal US -continue sodium bicarb and allopuronol - Urinalysis, Routine w reflex microscopic  2. Benign prostatic hyperplasia with urinary obstruction -continue flomax 0.4mg   3. Weak urinary stream -continue flomax 0.4mg  daily   No follow-ups on file.  Wilkie Aye, MD  [facility] [place]

## 2020-05-01 NOTE — Progress Notes (Signed)

## 2020-05-01 NOTE — Patient Instructions (Signed)
Dietary Guidelines to Help Prevent Kidney Stones Kidney stones are deposits of minerals and salts that form inside your kidneys. Your risk of developing kidney stones may be greater depending on your diet, your lifestyle, the medicines you take, and whether you have certain medical conditions. Most people can reduce their chances of developing kidney stones by following the instructions below. Depending on your overall health and the type of kidney stones you tend to develop, your dietitian may give you more specific instructions. What are tips for following this plan? Reading food labels  Choose foods with "no salt added" or "low-salt" labels. Limit your sodium intake to less than 1500 mg per day.  Choose foods with calcium for each meal and snack. Try to eat about 300 mg of calcium at each meal. Foods that contain 200-500 mg of calcium per serving include: ? 8 oz (237 ml) of milk, fortified nondairy milk, and fortified fruit juice. ? 8 oz (237 ml) of kefir, yogurt, and soy yogurt. ? 4 oz (118 ml) of tofu. ? 1 oz of cheese. ? 1 cup (300 g) of dried figs. ? 1 cup (91 g) of cooked broccoli. ? 1-3 oz can of sardines or mackerel.  Most people need 1000 to 1500 mg of calcium each day. Talk to your dietitian about how much calcium is recommended for you. Shopping  Buy plenty of fresh fruits and vegetables. Most people do not need to avoid fruits and vegetables, even if they contain nutrients that may contribute to kidney stones.  When shopping for convenience foods, choose: ? Whole pieces of fruit. ? Premade salads with dressing on the side. ? Low-fat fruit and yogurt smoothies.  Avoid buying frozen meals or prepared deli foods.  Look for foods with live cultures, such as yogurt and kefir. Cooking  Do not add salt to food when cooking. Place a salt shaker on the table and allow each person to add his or her own salt to taste.  Use vegetable protein, such as beans, textured vegetable  protein (TVP), or tofu instead of meat in pasta, casseroles, and soups. Meal planning   Eat less salt, if told by your dietitian. To do this: ? Avoid eating processed or premade food. ? Avoid eating fast food.  Eat less animal protein, including cheese, meat, poultry, or fish, if told by your dietitian. To do this: ? Limit the number of times you have meat, poultry, fish, or cheese each week. Eat a diet free of meat at least 2 days a week. ? Eat only one serving each day of meat, poultry, fish, or seafood. ? When you prepare animal protein, cut pieces into small portion sizes. For most meat and fish, one serving is about the size of one deck of cards.  Eat at least 5 servings of fresh fruits and vegetables each day. To do this: ? Keep fruits and vegetables on hand for snacks. ? Eat 1 piece of fruit or a handful of berries with breakfast. ? Have a salad and fruit at lunch. ? Have two kinds of vegetables at dinner.  Limit foods that are high in a substance called oxalate. These include: ? Spinach. ? Rhubarb. ? Beets. ? Potato chips and french fries. ? Nuts.  If you regularly take a diuretic medicine, make sure to eat at least 1-2 fruits or vegetables high in potassium each day. These include: ? Avocado. ? Banana. ? Orange, prune, carrot, or tomato juice. ? Baked potato. ? Cabbage. ? Beans and split   peas. General instructions   Drink enough fluid to keep your urine clear or pale yellow. This is the most important thing you can do.  Talk to your health care provider and dietitian about taking daily supplements. Depending on your health and the cause of your kidney stones, you may be advised: ? Not to take supplements with vitamin C. ? To take a calcium supplement. ? To take a daily probiotic supplement. ? To take other supplements such as magnesium, fish oil, or vitamin B6.  Take all medicines and supplements as told by your health care provider.  Limit alcohol intake to no  more than 1 drink a day for nonpregnant women and 2 drinks a day for men. One drink equals 12 oz of beer, 5 oz of wine, or 1 oz of hard liquor.  Lose weight if told by your health care provider. Work with your dietitian to find strategies and an eating plan that works best for you. What foods are not recommended? Limit your intake of the following foods, or as told by your dietitian. Talk to your dietitian about specific foods you should avoid based on the type of kidney stones and your overall health. Grains Breads. Bagels. Rolls. Baked goods. Salted crackers. Cereal. Pasta. Vegetables Spinach. Rhubarb. Beets. Canned vegetables. Pickles. Olives. Meats and other protein foods Nuts. Nut butters. Large portions of meat, poultry, or fish. Salted or cured meats. Deli meats. Hot dogs. Sausages. Dairy Cheese. Beverages Regular soft drinks. Regular vegetable juice. Seasonings and other foods Seasoning blends with salt. Salad dressings. Canned soups. Soy sauce. Ketchup. Barbecue sauce. Canned pasta sauce. Casseroles. Pizza. Lasagna. Frozen meals. Potato chips. French fries. Summary  You can reduce your risk of kidney stones by making changes to your diet.  The most important thing you can do is drink enough fluid. You should drink enough fluid to keep your urine clear or pale yellow.  Ask your health care provider or dietitian how much protein from animal sources you should eat each day, and also how much salt and calcium you should have each day. This information is not intended to replace advice given to you by your health care provider. Make sure you discuss any questions you have with your health care provider. Document Revised: 08/22/2018 Document Reviewed: 04/12/2016 Elsevier Patient Education  2020 [employer]  

## 2020-10-17 ENCOUNTER — Other Ambulatory Visit: Payer: Self-pay | Admitting: Urology

## 2020-10-23 ENCOUNTER — Other Ambulatory Visit: Payer: Self-pay

## 2020-10-23 ENCOUNTER — Ambulatory Visit (HOSPITAL_COMMUNITY)
Admission: RE | Admit: 2020-10-23 | Discharge: 2020-10-23 | Disposition: A | Discharge status: Home / Self Care | Payer: Managed Care, Other (non HMO) | Source: Ambulatory Visit | Attending: Urology | Admitting: Urology

## 2020-10-23 DIAGNOSIS — N200 Calculus of kidney: Secondary | ICD-10-CM

## 2020-10-30 ENCOUNTER — Encounter: Payer: Self-pay | Admitting: Urology

## 2020-10-30 ENCOUNTER — Other Ambulatory Visit: Payer: Self-pay

## 2020-10-30 ENCOUNTER — Ambulatory Visit (INDEPENDENT_AMBULATORY_CARE_PROVIDER_SITE_OTHER): Payer: Managed Care, Other (non HMO) | Admitting: Urology

## 2020-10-30 VITALS — BP 156/72 | HR 97 | Ht 65.0 in | Wt 195.0 lb

## 2020-10-30 DIAGNOSIS — N200 Calculus of kidney: Secondary | ICD-10-CM

## 2020-10-30 DIAGNOSIS — N138 Other obstructive and reflux uropathy: Secondary | ICD-10-CM | POA: Diagnosis not present

## 2020-10-30 DIAGNOSIS — N401 Enlarged prostate with lower urinary tract symptoms: Secondary | ICD-10-CM | POA: Diagnosis not present

## 2020-10-30 DIAGNOSIS — R3912 Poor urinary stream: Secondary | ICD-10-CM | POA: Diagnosis not present

## 2020-10-30 LAB — URINALYSIS, ROUTINE W REFLEX MICROSCOPIC
Bilirubin, UA: NEGATIVE
Ketones, UA: NEGATIVE
Leukocytes,UA: NEGATIVE
Nitrite, UA: NEGATIVE
Protein,UA: NEGATIVE
RBC, UA: NEGATIVE
Specific Gravity, UA: 1.015 (ref 1.005–1.030)
Urobilinogen, Ur: 0.2 mg/dL (ref 0.2–1.0)
pH, UA: 6 (ref 5.0–7.5)

## 2020-10-30 MED ORDER — TAMSULOSIN HCL 0.4 MG PO CAPS: 0.4000 mg | ORAL_CAPSULE | Freq: Every day | ORAL | 3 refills | Status: DC

## 2020-10-30 MED ORDER — SODIUM BICARBONATE 650 MG PO TABS: 650.0000 mg | ORAL_TABLET | Freq: Two times a day (BID) | ORAL | 3 refills | Status: DC

## 2020-10-30 MED ORDER — ALLOPURINOL 300 MG PO TABS: 300.0000 mg | ORAL_TABLET | Freq: Every day | ORAL | 3 refills | Status: DC

## 2020-10-30 NOTE — Progress Notes (Signed)
10/30/2020 10:54 AM   Austin Gould [birth year] [id number]  Referring provider: Barbie Banner, MD [address] [place],  [place] [address]  Followup BPH and nephrolithiasis   HPI: Austin Gould is a 65yo here for followup for nephrolithiasis and BPH. Renal US 10/26/2020 shows a stable 71mm left lower pole calculus. He denies flank pain. No stone events since last visit. He is on sodium bicarb 1300mg  daily, allopurinol 300mg . He is on flomax 0.4mg  daily. IPSS 2 QOL 1. No other complaints today   PMH: Past Medical History:  Diagnosis Date   Anemia 12/23/2016   IRON SUPPLEMENT   Bilateral ureteral calculi 10/04/2016   Diabetes mellitus    type 2   GERD (gastroesophageal reflux disease)    PRN MEDS as need   History of kidney stones 10/04/2016   bil. per CT   Hyperlipidemia    Hypertension    Umbilical hernia     Surgical History: Past Surgical History:  Procedure Laterality Date   COLONOSCOPY     CYSTOSCOPY W/ URETERAL STENT PLACEMENT Bilateral 10/04/2016   Procedure: CYSTOSCOPY WITH RETROGRADE PYELOGRAM/BILATERAL URETERAL STENT PLACEMENT;  Surgeon: 10/06/2016, MD;  Location: [facility];  Service: Urology;  Laterality: Bilateral;   CYSTOSCOPY W/ URETERAL STENT PLACEMENT Bilateral 12/23/2016   Procedure: CYSTOSCOPY WITH RETROGRADE PYELOGRAM/URETERAL BILATERAL STENT PLACEMENT;  Surgeon: Ihor Gully, MD;  Location: [facility];  Service: Urology;  Laterality: Bilateral;   CYSTOSCOPY WITH RETROGRADE PYELOGRAM, URETEROSCOPY AND STENT PLACEMENT Bilateral 11/25/2016   Procedure: CYSTOSCOPY BILATERAL STENT REMOVAL  BILATERAL, URETEROSCOPY STONE REMOVAL  AND left  STENT REPLACEMENT with holmium laser;  Surgeon: Malen Gauze, MD;  Location: [facility];  Service: Urology;  Laterality: Bilateral;   CYSTOSCOPY WITH RETROGRADE PYELOGRAM, URETEROSCOPY AND STENT PLACEMENT Bilateral 02/13/2017   Procedure: CYSTOSCOPY WITH RETROGRADE PYELOGRAM, URETEROSCOPY STONE BASKETRY AND  STENT REPLACEMENT;  Surgeon: [facility], MD;  Location: [facility];  Service: Urology;  Laterality: Bilateral;   CYSTOSCOPY WITH URETEROSCOPY  10/19/2011   Procedure: CYSTOSCOPY WITH URETEROSCOPY;  Surgeon: [facility] MD;  Location: [facility];  Service: Urology;  Laterality: Left;  cystoscopy, left ureteroscopy, holmium laser, lithotripsey left ureter stent placement    EXTRACORPOREAL SHOCK WAVE LITHOTRIPSY Left 08/18/2011   EXTRACORPOREAL SHOCK WAVE LITHOTRIPSY Left 10/17/2016   Procedure: LEFT EXTRACORPOREAL SHOCK WAVE LITHOTRIPSY (ESWL);  Surgeon: 10/18/2011, MD;  Location: [facility];  Service: Urology;  Laterality: Left;   HOLMIUM LASER APPLICATION Bilateral 11/25/2016   Procedure: HOLMIUM LASER APPLICATION;  Surgeon: Sebastian Ache, MD;  Location: [facility];  Service: Urology;  Laterality: Bilateral;   HOLMIUM LASER APPLICATION Bilateral 02/13/2017   Procedure: HOLMIUM LASER APPLICATION;  Surgeon: [facility] MD;  [facility];  Service: Urology;  Laterality: Bilateral;   INSERTION OF MESH N/A 01/08/2018   Procedure: INSERTION OF MESH;  Surgeon: [facility] MD;  Location: [facility];  Service: General;  Laterality: N/A;   UMBILICAL HERNIA REPAIR N/A 01/08/2018   Procedure: LAPAROSCOPIC UMBILICAL HERNIA REPAIR WITH MESH;  Surgeon: [facility], MD;  Location: [facility];  Service: General;  Laterality: N/A;    Home Medications:  Allergies as of 10/30/2020       Reactions   Ciprofloxacin Hives   Sulfa Antibiotics Hives   Sulfamethoxazole-trimethoprim Hives   Ace Inhibitors Other (See Comments)   unknown   Clarithromycin Other (See Comments)   unknown  Medication List        Accurate as of October 30, 2020 10:54 AM. If you have any questions, ask your nurse or doctor.          allopurinol 300 MG tablet Commonly known as: ZYLOPRIM Take 1 tablet (300 mg total) by mouth  daily.   aspirin EC 81 MG tablet Take 81 mg by mouth daily.   atorvastatin 40 MG tablet Commonly known as: LIPITOR Take 40 mg by mouth every morning.   esomeprazole 40 MG capsule Commonly known as: NEXIUM Take 40 mg by mouth daily as needed (reflux).   famotidine 20 MG tablet Commonly known as: PEPCID Take 20 mg by mouth 2 (two) times daily as needed for heartburn. Heart burn   Fish Oil 1000 MG Cpdr Take 1,000 mg by mouth 2 (two) times daily.   GARLIC PO Take 1 capsule by mouth 2 (two) times daily.   Januvia 100 MG tablet Generic drug: sitaGLIPtin Take 100 mg by mouth daily.   metFORMIN 1000 MG tablet Commonly known as: GLUCOPHAGE Take 1,000 mg by mouth 2 (two) times daily with a meal.   olmesartan 40 MG tablet Commonly known as: BENICAR Take 40 mg by mouth every evening.   pioglitazone 15 MG tablet Commonly known as: ACTOS Take 15 mg by mouth daily.   pioglitazone 45 MG tablet Commonly known as: ACTOS Take 45 mg by mouth daily.   sodium bicarbonate 650 MG tablet Take 1 tablet (650 mg total) by mouth 2 (two) times daily.   tamsulosin 0.4 MG Caps capsule Commonly known as: FLOMAX Take 1 capsule (0.4 mg total) by mouth at bedtime.   telmisartan 80 MG tablet Commonly known as: MICARDIS        Allergies:  Allergies  Allergen Reactions   Ciprofloxacin Hives   Sulfa Antibiotics Hives   Sulfamethoxazole-Trimethoprim Hives   Ace Inhibitors Other (See Comments)    unknown   Clarithromycin Other (See Comments)    unknown    Family History: No family history on file.  Social History:  reports that he has quit smoking. His smoking use included cigarettes. He has never used smokeless tobacco. He reports that he does not drink alcohol and does not use drugs.  ROS: All other review of systems were reviewed and are negative except what is noted above in HPI  Physical Exam: BP (!) 156/72   Pulse 97   Ht 5\' 5"  (1.651 m)   Wt 195 lb (88.5 kg)   BMI 32.45  kg/m   Constitutional:  Alert and oriented, No acute distress. HEENT: [place] AT, moist mucus membranes.  Trachea midline, no masses. Cardiovascular: No clubbing, cyanosis, or edema. Respiratory: Normal respiratory effort, no increased work of breathing. GI: Abdomen is soft, nontender, nondistended, no abdominal masses GU: No CVA tenderness.  Lymph: No cervical or inguinal lymphadenopathy. Skin: No rashes, bruises or suspicious lesions. Neurologic: Grossly intact, no focal deficits, moving all 4 extremities. Psychiatric: Normal mood and affect.  Laboratory Data: Lab Results  Component Value Date   WBC 9.1 12/29/2017   HGB 14.2 12/29/2017   HCT 46.8 12/29/2017   MCV 89.0 12/29/2017   PLT 297 12/29/2017    Lab Results  Component Value Date   CREATININE 1.26 (H) 12/29/2017    No results found for: PSA  No results found for: TESTOSTERONE  Lab Results  Component Value Date   HGBA1C 9.8 (H) 12/29/2017    Urinalysis    Component Value Date/Time   COLORURINE  YELLOW 12/23/2016 0115   APPEARANCEUR Clear 05/01/2020 1316   LABSPEC 1.015 12/23/2016 0115   PHURINE 5.0 12/23/2016 0115   GLUCOSEU 3+ (A) 05/01/2020 1316   HGBUR MODERATE (A) 12/23/2016 0115   BILIRUBINUR Negative 05/01/2020 1316   KETONESUR 5 (A) 12/23/2016 0115   PROTEINUR Negative 05/01/2020 1316   PROTEINUR 30 (A) 12/23/2016 0115   NITRITE Negative 05/01/2020 1316   NITRITE POSITIVE (A) 12/23/2016 0115   LEUKOCYTESUR Negative 05/01/2020 1316    Lab Results  Component Value Date   LABMICR See below: 05/01/2020   WBCUA None seen 05/01/2020   LABEPIT 0-10 05/01/2020   MUCUS Present 05/01/2020   BACTERIA Few 05/01/2020    Pertinent Imaging: Re[place] 10/26/2020: Images reviewed and discussed with the patient Results for orders placed during the hospital encounter of 11/25/16  KUB day of procedure  Narrative CLINICAL DATA:  Bilateral renal colic.  EXAM: ABDOMEN - 1 VIEW  COMPARISON:  November 15, 2016  FINDINGS: Double-J stents are again noted extending from the respective renal pelvis levels to the bladder. There is a calcification at the expected level of the right ureteropelvic junction measuring 7 x 6 mm. There is a calcification on the left at the level of the inferior aspect of L3 measuring 1.2 x 0.5 cm. There are several calcifications in the lower pole of left kidney, largest measuring 7 mm. There is a small amount of prostatic calcification.  There is moderate stool in colon. No bowel dilatation or air-fluid level to suggest bowel obstruction. No free air.  IMPRESSION: Double-J stents in place bilaterally. Proximal ureteral calculi bilaterally. Calculi lower pole left kidney. Several small prostatic calculi noted. No bowel obstruction or free air evident.   Electronically Signed By: Bretta Bang III M.D. On: 11/25/2016 08:10  No results found for this or any previous visit.  No results found for this or any previous visit.  No results found for this or any previous visit.  Results for orders placed during the hospital encounter of 10/23/20  US RENAL  Narrative CLINICAL DATA:  History of urolithiasis, follow-up  EXAM: RENAL / URINARY TRACT ULTRASOUND COMPLETE  COMPARISON:  Ultrasound 03/01/2019, CT 06/21/2018  FINDINGS: Right Kidney:  Renal measurements: 1.8 x 5.4 x 5.9 cm = volume: 196.5 mL. Echogenicity is within normal limits. No concerning renal mass, shadowing calculus or hydronephrosis. Trace perinephric fluid noted towards the lower.  Left Kidney:  Renal measurements: 11.1 x 5.3 x 5 1 cm = volume: 155.6 mL. Echogenicity is within normal limits. No concerning renal mass. Shadowing echogenic renal calculus in the lower pole left kidney measures up to 5 mm. Pelvicaliectasis versus mild hydronephrosis which persists after voiding.  Bladder:  Bladder volume of 576 mL at the time of exam with postvoid residual of 62 mL. Bladder is  unremarkable for the degree under distension. Bilateral bladder jets are identified. No visible bladder calculi or debris.  Other:  None.  IMPRESSION: 5 mm nonobstructing calculus in the lower pole left kidney.  Left renal pelvicaliectasis versus mild hydronephrosis which persists after voiding but with visualization of the left bladder jet.  Postvoid residual 62 mL is slightly greater than expected. Could suggest some incomplete voiding. Correlate for clinical symptoms of outlet obstruction.  Nonspecific trace perinephric fluid along the inferior pole left kidney.   Electronically Signed By: Kreg Shropshire M.D. On: 10/26/2020 03:18  No results found for this or any previous visit.  No results found for this or any previous visit.  Results for orders placed during the hospital encounter of 12/23/16  CT Renal Stone Study  Narrative CLINICAL DATA:  Acute onset of left flank pain, fever, nausea and vomiting. Leukocytosis. Initial encounter.  EXAM: CT ABDOMEN AND PELVIS WITHOUT CONTRAST  TECHNIQUE: Multidetector CT imaging of the abdomen and pelvis was performed following the standard protocol without IV contrast.  COMPARISON:  CT of the abdomen and pelvis performed 10/04/2016  FINDINGS: Lower chest: The visualized lung bases are grossly clear. The visualized portions of the mediastinum are unremarkable.  Hepatobiliary: The liver is unremarkable in appearance. The gallbladder is unremarkable in appearance. The common bile duct remains normal in caliber.  Pancreas: The pancreas is within normal limits.  Spleen: The spleen is unremarkable in appearance.  Adrenals/Urinary Tract: The adrenal glands are unremarkable in appearance.  Moderate to severe left-sided hydronephrosis is noted, with left-sided perinephric stranding. There is distention of the left ureter along its entire course. No distal obstructing stone is seen. This may reflect a recently passed  stone.  Mild right-sided hydronephrosis is noted, reflecting an obstructing 8 x 5 mm stone proximally at the right ureteropelvic junction. Scattered nonobstructing stones are noted within the left kidney, measuring up to 5 mm in size.  Stomach/Bowel: The stomach is unremarkable in appearance. The small bowel is within normal limits. The appendix is not visualized; there is no evidence for appendicitis. The colon is unremarkable in appearance.  Vascular/Lymphatic: Scattered calcification is seen along the abdominal aorta and its branches. The abdominal aorta is otherwise grossly unremarkable. The inferior vena cava is grossly unremarkable. No retroperitoneal lymphadenopathy is seen. No pelvic sidewall lymphadenopathy is identified.  Reproductive: The bladder is mildly distended and grossly unremarkable. The prostate is enlarged, measuring 5.1 cm in transverse dimension.  Other: A moderate umbilical hernia is noted, containing only fat.  Musculoskeletal: No acute osseous abnormalities are identified. The visualized musculature is unremarkable in appearance.  IMPRESSION: 1. Moderate to severe left-sided hydronephrosis. Distention of the left ureter along its entire course. No distal obstructing stone seen. This may reflect a recently passed stone. 2. Mild right-sided hydronephrosis, reflecting an obstructing 8 x 5 mm stone proximally at the right ureteropelvic junction. Would follow the patient's renal function closely, given bilateral hydronephrosis. 3. Scattered nonobstructing left renal stones measure up to 5 mm in size. 4. Scattered aortic atherosclerosis. 5. Enlarged prostate noted. 6. Moderate umbilical hernia, containing only fat.   Electronically Signed By: Roanna Raider M.D. On: 12/23/2016 06:16   Assessment & Plan:    1. Calculus, renal -Continue allopurinol 300mg  and sodium bicarb 650mg  BID -RTC 6 months with a renal - Urinalysis, Routine w reflex  microscopic - Ultrasound renal complete; Future  2. Benign prostatic hyperplasia with urinary obstruction -Continue flomax 0.4mg  daily  3. Weak urinary stream Continue flomax 0.4mg  daily   No follow-ups on file.  , MD  [facility] [place]

## 2020-10-30 NOTE — Progress Notes (Signed)

## 2020-10-30 NOTE — Patient Instructions (Signed)
Textbook of Natural Medicine (5th ed., pp. 1518-1527.e3). [place], [place]: Elsevier.">  Dietary Guidelines to Help Prevent Kidney Stones Kidney stones are deposits of minerals and salts that form inside your kidneys. Your risk of developing kidney stones may be greater depending on your diet, your lifestyle, the medicines you take, and whether you have certain medical conditions. Most people can lower their chances of developing kidney stones by following the instructions below. Your dietitian may give you more specific instructions depending on your overall health and the type of kidney stones youtend to develop. What are tips for following this plan? Reading food labels  Choose foods with "no salt added" or "low-salt" labels. Limit your salt (sodium) intake to less than 1,500 mg a day. Choose foods with calcium for each meal and snack. Try to eat about 300 mg of calcium at each meal. Foods that contain 200-500 mg of calcium a serving include: 8 oz (237 mL) of milk, calcium-fortifiednon-dairy milk, and calcium-fortifiedfruit juice. Calcium-fortified means that calcium has been added to these drinks. 8 oz (237 mL) of kefir, yogurt, and soy yogurt. 4 oz (114 g) of tofu. 1 oz (28 g) of cheese. 1 cup (150 g) of dried figs. 1 cup (91 g) of cooked broccoli. One 3 oz (85 g) can of sardines or mackerel. Most people need 1,000-1,500 mg of calcium a day. Talk to your dietitian abouthow much calcium is recommended for you. Shopping Buy plenty of fresh fruits and vegetables. Most people do not need to avoid fruits and vegetables, even if these foods contain nutrients that may contribute to kidney stones. When shopping for convenience foods, choose: Whole pieces of fruit. Pre-made salads with dressing on the side. Low-fat fruit and yogurt smoothies. Avoid buying frozen meals or prepared deli foods. These can be high in sodium. Look for foods with live cultures, such as yogurt and kefir. Choose high-fiber  grains, such as whole-wheat breads, oat bran, and wheat cereals. Cooking Do not add salt to food when cooking. Place a salt shaker on the table and allow each person to add his or her own salt to taste. Use vegetable protein, such as beans, textured vegetable protein (TVP), or tofu, instead of meat in pasta, casseroles, and soups. Meal planning Eat less salt, if told by your dietitian. To do this: Avoid eating processed or pre-made food. Avoid eating fast food. Eat less animal protein, including cheese, meat, poultry, or fish, if told by your dietitian. To do this: Limit the number of times you have meat, poultry, fish, or cheese each week. Eat a diet free of meat at least 2 days a week. Eat only one serving each day of meat, poultry, fish, or seafood. When you prepare animal protein, cut pieces into small portion sizes. For most meat and fish, one serving is about the size of the palm of your hand. Eat at least five servings of fresh fruits and vegetables each day. To do this: Keep fruits and vegetables on hand for snacks. Eat one piece of fruit or a handful of berries with breakfast. Have a salad and fruit at lunch. Have two kinds of vegetables at dinner. Limit foods that are high in a substance called oxalate. These include: Spinach (cooked), rhubarb, beets, sweet potatoes, and Swiss chard. Peanuts. Potato chips, french fries, and baked potatoes with skin on. Nuts and nut products. Chocolate. If you regularly take a diuretic medicine, make sure to eat at least 1 or 2 servings of fruits or vegetables that are   high in potassium each day. These include: Avocado. Banana. Orange, prune, carrot, or tomato juice. Baked potato. Cabbage. Beans and split peas. Lifestyle  Drink enough fluid to keep your urine pale yellow. This is the most important thing you can do. Spread your fluid intake throughout the day. If you drink alcohol: Limit how much you use to: 0-1 drink a day for women who  are not pregnant. 0-2 drinks a day for men. Be aware of how much alcohol is in your drink. In the [place] one drink equals one 12 oz bottle of beer (355 mL), one 5 oz glass of wine (148 mL), or one 1 oz glass of hard liquor (44 mL). Lose weight if told by your health care provider. Work with your dietitian to find an eating plan and weight loss strategies that work best for you.  General information Talk to your health care provider and dietitian about taking daily supplements. You may be told the following depending on your health and the cause of your kidney stones: Not to take supplements with vitamin C. To take a calcium supplement. To take a daily probiotic supplement. To take other supplements such as magnesium, fish oil, or vitamin B6. Take over-the-counter and prescription medicines only as told by your health care provider. These include supplements. What foods should I limit? Limit your intake of the following foods, or eat them as told by your dietitian. Vegetables Spinach. Rhubarb. Beets. Canned vegetables. Pickles. Olives. Baked potatoeswith skin. Grains Wheat bran. Baked goods. Salted crackers. Cereals high in sugar. Meats and other proteins Nuts. Nut butters. Large portions of meat, poultry, or fish. Salted, precooked,or cured meats, such as sausages, meat loaves, and hot dogs. Dairy Cheese. Beverages Regular soft drinks. Regular vegetable juice. Seasonings and condiments Seasoning blends with salt. Salad dressings. Soy sauce. Ketchup. Barbecue sauce. Other foods Canned soups. Canned pasta sauce. Casseroles. Pizza. Lasagna. Frozen meals.Potato chips. French fries. The items listed above may not be a complete list of foods and beverages you should limit. Contact a dietitian for more information. What foods should I avoid? Talk to your dietitian about specific foods you should avoid based on the typeof kidney stones you have and your overall health. Fruits Grapefruit. The  item listed above may not be a complete list of foods and beverages you should avoid. Contact a dietitian for more information. Summary Kidney stones are deposits of minerals and salts that form inside your kidneys. You can lower your risk of kidney stones by making changes to your diet. The most important thing you can do is drink enough fluid. Drink enough fluid to keep your urine pale yellow. Talk to your dietitian about how much calcium you should have each day, and eat less salt and animal protein as told by your dietitian. This information is not intended to replace advice given to you by your health care provider. Make sure you discuss any questions you have with your healthcare provider. Document Revised: 04/25/2019 Document Reviewed: 04/25/2019 Elsevier Patient Education  2022 [employer]  

## 2021-04-23 ENCOUNTER — Ambulatory Visit (HOSPITAL_COMMUNITY): Payer: Managed Care, Other (non HMO)

## 2021-04-30 ENCOUNTER — Ambulatory Visit: Payer: Managed Care, Other (non HMO) | Admitting: Urology

## 2021-05-26 ENCOUNTER — Ambulatory Visit (HOSPITAL_COMMUNITY): Payer: 59

## 2021-06-04 ENCOUNTER — Ambulatory Visit: Payer: Managed Care, Other (non HMO) | Admitting: Urology

## 2021-06-21 ENCOUNTER — Ambulatory Visit (HOSPITAL_COMMUNITY)
Admission: RE | Admit: 2021-06-21 | Discharge: 2021-06-21 | Disposition: A | Discharge status: Home / Self Care | Payer: 59 | Source: Ambulatory Visit | Attending: Urology | Admitting: Urology

## 2021-06-21 ENCOUNTER — Other Ambulatory Visit: Payer: Self-pay

## 2021-06-21 DIAGNOSIS — K7689 Other specified diseases of liver: Secondary | ICD-10-CM | POA: Diagnosis not present

## 2021-06-21 DIAGNOSIS — N200 Calculus of kidney: Secondary | ICD-10-CM | POA: Diagnosis not present

## 2021-06-21 DIAGNOSIS — N1330 Unspecified hydronephrosis: Secondary | ICD-10-CM | POA: Diagnosis not present

## 2021-06-23 ENCOUNTER — Ambulatory Visit (INDEPENDENT_AMBULATORY_CARE_PROVIDER_SITE_OTHER): Payer: 59 | Admitting: Urology

## 2021-06-23 ENCOUNTER — Ambulatory Visit (HOSPITAL_COMMUNITY)
Admission: RE | Admit: 2021-06-23 | Discharge: 2021-06-23 | Disposition: A | Discharge status: Home / Self Care | Payer: 59 | Source: Ambulatory Visit | Attending: Urology | Admitting: Urology

## 2021-06-23 ENCOUNTER — Encounter: Payer: Self-pay | Admitting: Urology

## 2021-06-23 ENCOUNTER — Other Ambulatory Visit: Payer: Self-pay

## 2021-06-23 VITALS — BP 165/71 | HR 116

## 2021-06-23 DIAGNOSIS — Z87442 Personal history of urinary calculi: Secondary | ICD-10-CM | POA: Diagnosis not present

## 2021-06-23 DIAGNOSIS — R3912 Poor urinary stream: Secondary | ICD-10-CM

## 2021-06-23 DIAGNOSIS — N401 Enlarged prostate with lower urinary tract symptoms: Secondary | ICD-10-CM | POA: Diagnosis not present

## 2021-06-23 DIAGNOSIS — N138 Other obstructive and reflux uropathy: Secondary | ICD-10-CM

## 2021-06-23 DIAGNOSIS — M2578 Osteophyte, vertebrae: Secondary | ICD-10-CM | POA: Diagnosis not present

## 2021-06-23 DIAGNOSIS — N200 Calculus of kidney: Secondary | ICD-10-CM

## 2021-06-23 MED ORDER — TAMSULOSIN HCL 0.4 MG PO CAPS: 0.4000 mg | ORAL_CAPSULE | Freq: Every day | ORAL | 3 refills | Status: DC

## 2021-06-23 MED ORDER — ALLOPURINOL 300 MG PO TABS: 300.0000 mg | ORAL_TABLET | Freq: Every day | ORAL | 3 refills | Status: DC

## 2021-06-23 MED ORDER — OXYCODONE-ACETAMINOPHEN 5-325 MG PO TABS: 1.0000 | ORAL_TABLET | ORAL | 0 refills | Status: AC | PRN

## 2021-06-23 MED ORDER — SODIUM BICARBONATE 650 MG PO TABS: 650.0000 mg | ORAL_TABLET | Freq: Two times a day (BID) | ORAL | 3 refills | Status: DC

## 2021-06-23 NOTE — Patient Instructions (Signed)
Dietary Guidelines to Help Prevent Kidney Stones Kidney stones are deposits of minerals and salts that form inside your kidneys. Your risk of developing kidney stones may be greater depending on your diet, your lifestyle, the medicines you take, and whether you have certain medical conditions. Most people can lower their chances of developing kidney stones by following the instructions below. Your dietitian may give you more specific instructions depending on your overall health and the type of kidney stones you tend to develop. What are tips for following this plan? Reading food labels  Choose foods with "no salt added" or "low-salt" labels. Limit your salt (sodium) intake to less than 1,500 mg a day. Choose foods with calcium for each meal and snack. Try to eat about 300 mg of calcium at each meal. Foods that contain 200-500 mg of calcium a serving include: 8 oz (237 mL) of milk, calcium-fortifiednon-dairy milk, and calcium-fortifiedfruit juice. Calcium-fortified means that calcium has been added to these drinks. 8 oz (237 mL) of kefir, yogurt, and soy yogurt. 4 oz (114 g) of tofu. 1 oz (28 g) of cheese. 1 cup (150 g) of dried figs. 1 cup (91 g) of cooked broccoli. One 3 oz (85 g) can of sardines or mackerel. Most people need 1,000-1,500 mg of calcium a day. Talk to your dietitian about how much calcium is recommended for you. Shopping Buy plenty of fresh fruits and vegetables. Most people do not need to avoid fruits and vegetables, even if these foods contain nutrients that may contribute to kidney stones. When shopping for convenience foods, choose: Whole pieces of fruit. Pre-made salads with dressing on the side. Low-fat fruit and yogurt smoothies. Avoid buying frozen meals or prepared deli foods. These can be high in sodium. Look for foods with live cultures, such as yogurt and kefir. Choose high-fiber grains, such as whole-wheat breads, oat bran, and wheat cereals. Cooking Do not add  salt to food when cooking. Place a salt shaker on the table and allow each person to add his or her own salt to taste. Use vegetable protein, such as beans, textured vegetable protein (TVP), or tofu, instead of meat in pasta, casseroles, and soups. Meal planning Eat less salt, if told by your dietitian. To do this: Avoid eating processed or pre-made food. Avoid eating fast food. Eat less animal protein, including cheese, meat, poultry, or fish, if told by your dietitian. To do this: Limit the number of times you have meat, poultry, fish, or cheese each week. Eat a diet free of meat at least 2 days a week. Eat only one serving each day of meat, poultry, fish, or seafood. When you prepare animal protein, cut pieces into small portion sizes. For most meat and fish, one serving is about the size of the palm of your hand. Eat at least five servings of fresh fruits and vegetables each day. To do this: Keep fruits and vegetables on hand for snacks. Eat one piece of fruit or a handful of berries with breakfast. Have a salad and fruit at lunch. Have two kinds of vegetables at dinner. Limit foods that are high in a substance called oxalate. These include: Spinach (cooked), rhubarb, beets, sweet potatoes, and Swiss chard. Peanuts. Potato chips, french fries, and baked potatoes with skin on. Nuts and nut products. Chocolate. If you regularly take a diuretic medicine, make sure to eat at least 1 or 2 servings of fruits or vegetables that are high in potassium each day. These include: Avocado. Banana. Orange, prune,   carrot, or tomato juice. Baked potato. Cabbage. Beans and split peas. Lifestyle  Drink enough fluid to keep your urine pale yellow. This is the most important thing you can do. Spread your fluid intake throughout the day. If you drink alcohol: Limit how much you use to: 0-1 drink a day for women who are not pregnant. 0-2 drinks a day for men. Be aware of how much alcohol is in your  drink. In the [place] one drink equals one 12 oz bottle of beer (355 mL), one 5 oz glass of wine (148 mL), or one 1 oz glass of hard liquor (44 mL). Lose weight if told by your health care provider. Work with your dietitian to find an eating plan and weight loss strategies that work best for you. General information Talk to your health care provider and dietitian about taking daily supplements. You may be told the following depending on your health and the cause of your kidney stones: Not to take supplements with vitamin C. To take a calcium supplement. To take a daily probiotic supplement. To take other supplements such as magnesium, fish oil, or vitamin B6. Take over-the-counter and prescription medicines only as told by your health care provider. These include supplements. What foods should I limit? Limit your intake of the following foods, or eat them as told by your dietitian. Vegetables Spinach. Rhubarb. Beets. Canned vegetables. Pickles. Olives. Baked potatoes with skin. Grains Wheat bran. Baked goods. Salted crackers. Cereals high in sugar. Meats and other proteins Nuts. Nut butters. Large portions of meat, poultry, or fish. Salted, precooked, or cured meats, such as sausages, meat loaves, and hot dogs. Dairy Cheese. Beverages Regular soft drinks. Regular vegetable juice. Seasonings and condiments Seasoning blends with salt. Salad dressings. Soy sauce. Ketchup. Barbecue sauce. Other foods Canned soups. Canned pasta sauce. Casseroles. Pizza. Lasagna. Frozen meals. Potato chips. French fries. The items listed above may not be a complete list of foods and beverages you should limit. Contact a dietitian for more information. What foods should I avoid? Talk to your dietitian about specific foods you should avoid based on the type of kidney stones you have and your overall health. Fruits Grapefruit. The item listed above may not be a complete list of foods and beverages you should  avoid. Contact a dietitian for more information. Summary Kidney stones are deposits of minerals and salts that form inside your kidneys. You can lower your risk of kidney stones by making changes to your diet. The most important thing you can do is drink enough fluid. Drink enough fluid to keep your urine pale yellow. Talk to your dietitian about how much calcium you should have each day, and eat less salt and animal protein as told by your dietitian. This information is not intended to replace advice given to you by your health care provider. Make sure you discuss any questions you have with your health care provider. Document Revised: 04/25/2019 Document Reviewed: 04/25/2019 Elsevier Patient Education  2022 Elsevier Inc.  

## 2021-06-23 NOTE — Progress Notes (Signed)
06/23/2021 3:36 PM   Austin Gould Apr 11, 1956 [id number]  Referring provider: Christain Sacramento, MD [address] [place],  [place] [address]  Followup BPH and nephrolithiasis   HPI: Austin Gould is a 65yo here for followup for BPh and nephrolithiasis. IPSS 1 QOL 0 on flomax 0.4mg  daily. Urine stream strong. Nocturia 0-1x. No straining to urinate. He denies any stone events since last visit. No flank pain. No hematuria Renal US 06/21/2021 shows no left renal calculus and new mild left hydronephrolithiasis   PMH: Past Medical History:  Diagnosis Date   Anemia 12/23/2016   IRON SUPPLEMENT   Bilateral ureteral calculi 10/04/2016   Diabetes mellitus    type 2   GERD (gastroesophageal reflux disease)    PRN MEDS as need   History of kidney stones 10/04/2016   bil. per CT   Hyperlipidemia    Hypertension    Umbilical hernia     Surgical History: Past Surgical History:  Procedure Laterality Date   COLONOSCOPY     CYSTOSCOPY W/ URETERAL STENT PLACEMENT Bilateral 10/04/2016   Procedure: CYSTOSCOPY WITH RETROGRADE PYELOGRAM/BILATERAL URETERAL STENT PLACEMENT;  Surgeon: Kathie Rhodes, MD;  Location: [facility];  Service: Urology;  Laterality: Bilateral;   CYSTOSCOPY W/ URETERAL STENT PLACEMENT Bilateral 12/23/2016   Procedure: CYSTOSCOPY WITH RETROGRADE PYELOGRAM/URETERAL BILATERAL STENT PLACEMENT;  Surgeon: Cleon Gustin, MD;  Location: [facility];  Service: Urology;  Laterality: Bilateral;   CYSTOSCOPY WITH RETROGRADE PYELOGRAM, URETEROSCOPY AND STENT PLACEMENT Bilateral 11/25/2016   Procedure: CYSTOSCOPY BILATERAL STENT REMOVAL  BILATERAL, URETEROSCOPY STONE REMOVAL  AND left  STENT REPLACEMENT with holmium laser;  Surgeon: Kathie Rhodes, MD;  Location: [facility];  Service: Urology;  Laterality: Bilateral;   CYSTOSCOPY WITH RETROGRADE PYELOGRAM, URETEROSCOPY AND STENT PLACEMENT Bilateral 02/13/2017   Procedure: CYSTOSCOPY WITH RETROGRADE PYELOGRAM, URETEROSCOPY STONE  BASKETRY AND STENT REPLACEMENT;  Surgeon: Cleon Gustin, MD;  Location: [facility];  Service: Urology;  Laterality: Bilateral;   CYSTOSCOPY WITH URETEROSCOPY  10/19/2011   Procedure: CYSTOSCOPY WITH URETEROSCOPY;  Surgeon: Molli Hazard, MD;  Location: [facility];  Service: Urology;  Laterality: Left;  cystoscopy, left ureteroscopy, holmium laser, lithotripsey left ureter stent placement    EXTRACORPOREAL SHOCK WAVE LITHOTRIPSY Left 08/18/2011   EXTRACORPOREAL SHOCK WAVE LITHOTRIPSY Left 10/17/2016   Procedure: LEFT EXTRACORPOREAL SHOCK WAVE LITHOTRIPSY (ESWL);  Surgeon: Alexis Frock, MD;  Location: [facility];  Service: Urology;  Laterality: Left;   HOLMIUM LASER APPLICATION Bilateral [id number]   Procedure: HOLMIUM LASER APPLICATION;  Surgeon: Kathie Rhodes, MD;  Location: [facility];  Service: Urology;  Laterality: Bilateral;   HOLMIUM LASER APPLICATION Bilateral [id number]   Procedure: HOLMIUM LASER APPLICATION;  Surgeon: Cleon Gustin, MD;  Location: [facility];  Service: Urology;  Laterality: Bilateral;   INSERTION OF MESH N/A 01/08/2018   Procedure: INSERTION OF MESH;  Surgeon: Ralene Ok, MD;  Location: [place];  Service: General;  Laterality: N/A;   UMBILICAL HERNIA REPAIR N/A 01/08/2018   Procedure: LAPAROSCOPIC UMBILICAL HERNIA REPAIR WITH MESH;  Surgeon: Ralene Ok, MD;  Location: [place];  Service: General;  Laterality: N/A;    Home Medications:  Allergies as of 06/23/2021       Reactions   Ciprofloxacin Hives   Sulfa Antibiotics Hives   Sulfamethoxazole-trimethoprim Hives   Ace Inhibitors Other (See Comments)   unknown   Clarithromycin Other (See Comments)   unknown        Medication  List        Accurate as of June 23, 2021  3:36 PM. If you have any questions, ask your nurse or doctor.          allopurinol 300 MG tablet Commonly known as: ZYLOPRIM Take 1 tablet (300 mg total)  by mouth daily.   aspirin EC 81 MG tablet Take 81 mg by mouth daily.   atorvastatin 40 MG tablet Commonly known as: LIPITOR Take 40 mg by mouth every morning.   esomeprazole 40 MG capsule Commonly known as: NEXIUM Take 40 mg by mouth daily as needed (reflux).   famotidine 20 MG tablet Commonly known as: PEPCID Take 20 mg by mouth 2 (two) times daily as needed for heartburn. Heart burn   Fish Oil 1000 MG Cpdr Take 1,000 mg by mouth 2 (two) times daily.   GARLIC PO Take 1 capsule by mouth 2 (two) times daily.   Januvia 100 MG tablet Generic drug: sitaGLIPtin Take 100 mg by mouth daily.   metFORMIN 1000 MG tablet Commonly known as: GLUCOPHAGE Take 1,000 mg by mouth 2 (two) times daily with a meal.   olmesartan 40 MG tablet Commonly known as: BENICAR Take 40 mg by mouth every evening.   pioglitazone 15 MG tablet Commonly known as: ACTOS Take 15 mg by mouth daily.   pioglitazone 45 MG tablet Commonly known as: ACTOS Take 45 mg by mouth daily.   sodium bicarbonate 650 MG tablet Take 1 tablet (650 mg total) by mouth 2 (two) times daily.   tamsulosin 0.4 MG Caps capsule Commonly known as: FLOMAX Take 1 capsule (0.4 mg total) by mouth at bedtime.   telmisartan 80 MG tablet Commonly known as: MICARDIS        Allergies:  Allergies  Allergen Reactions   Ciprofloxacin Hives   Sulfa Antibiotics Hives   Sulfamethoxazole-Trimethoprim Hives   Ace Inhibitors Other (See Comments)    unknown   Clarithromycin Other (See Comments)    unknown    Family History: No family history on file.  Social History:  reports that he has quit smoking. His smoking use included cigarettes. He has never used smokeless tobacco. He reports that he does not drink alcohol and does not use drugs.  ROS: All other review of systems were reviewed and are negative except what is noted above in HPI  Physical Exam: BP (!) 165/71    Pulse (!) 116   Constitutional:  Alert and oriented, No  acute distress. HEENT: [place] AT, moist mucus membranes.  Trachea midline, no masses. Cardiovascular: No clubbing, cyanosis, or edema. Respiratory: Normal respiratory effort, no increased work of breathing. GI: Abdomen is soft, nontender, nondistended, no abdominal masses GU: No CVA tenderness.  Lymph: No cervical or inguinal lymphadenopathy. Skin: No rashes, bruises or suspicious lesions. Neurologic: Grossly intact, no focal deficits, moving all 4 extremities. Psychiatric: Normal mood and affect.  Laboratory Data: Lab Results  Component Value Date   WBC 9.1 12/29/2017   HGB 14.2 12/29/2017   HCT 46.8 12/29/2017   MCV 89.0 12/29/2017   PLT 297 12/29/2017    Lab Results  Component Value Date   CREATININE 1.26 (H) 12/29/2017    No results found for: PSA  No results found for: TESTOSTERONE  Lab Results  Component Value Date   HGBA1C 9.8 (H) 12/29/2017    Urinalysis    Component Value Date/Time   COLORURINE YELLOW 12/23/2016 0115   APPEARANCEUR Clear 10/30/2020 1024   LABSPEC 1.015 12/23/2016 0115  PHURINE 5.0 12/23/2016 0115   GLUCOSEU 2+ (A) 10/30/2020 1024   HGBUR MODERATE (A) 12/23/2016 0115   BILIRUBINUR Negative 10/30/2020 1024   KETONESUR 5 (A) 12/23/2016 0115   PROTEINUR Negative 10/30/2020 1024   PROTEINUR 30 (A) 12/23/2016 0115   NITRITE Negative 10/30/2020 1024   NITRITE POSITIVE (A) 12/23/2016 0115   LEUKOCYTESUR Negative 10/30/2020 1024    Lab Results  Component Value Date   LABMICR See below: 05/01/2020   WBCUA None seen 05/01/2020   LABEPIT 0-10 05/01/2020   MUCUS Present 05/01/2020   BACTERIA Few 05/01/2020    Pertinent Imaging: Renal US 06/21/2021: Images reviewed and discussed with the patient  Results for orders placed during the hospital encounter of 11/25/16  KUB day of procedure  Narrative CLINICAL DATA:  Bilateral renal colic.  EXAM: ABDOMEN - 1 VIEW  COMPARISON:  November 15, 2016  FINDINGS: Double-J stents are again noted  extending from the respective renal pelvis levels to the bladder. There is a calcification at the expected level of the right ureteropelvic junction measuring 7 x 6 mm. There is a calcification on the left at the level of the inferior aspect of L3 measuring 1.2 x 0.5 cm. There are several calcifications in the lower pole of left kidney, largest measuring 7 mm. There is a small amount of prostatic calcification.  There is moderate stool in colon. No bowel dilatation or air-fluid level to suggest bowel obstruction. No free air.  IMPRESSION: Double-J stents in place bilaterally. Proximal ureteral calculi bilaterally. Calculi lower pole left kidney. Several small prostatic calculi noted. No bowel obstruction or free air evident.   Electronically Signed By: Lowella Grip III M.D. On: 11/25/2016 08:10  No results found for this or any previous visit.  No results found for this or any previous visit.  No results found for this or any previous visit.  Results for orders placed during the hospital encounter of 06/21/21  Ultrasound renal complete  Narrative CLINICAL DATA:  Nephrolithiasis.  EXAM: RENAL / URINARY TRACT ULTRASOUND COMPLETE  COMPARISON:  Renal ultrasound 10/23/2020. CT renal stone 12/23/2016.  FINDINGS: Right Kidney:  Renal measurements: 10.4 x 4.9 x 6.8 cm = volume: 182 mL. Echogenicity within normal limits. No mass or hydronephrosis visualized.  Left Kidney:  Renal measurements: 11.6 x 7.2 x 5.8 cm = volume: 251 mL. Echogenicity within normal limits. Mild hydronephrosis. No mass visualized.  Bladder:  Appears normal for Gould of bladder distention. Postvoid residual 99 CT.  Other:  Echogenic liver.  IMPRESSION: 1. Mild left-sided hydronephrosis. 2. No renal calculi visualized. 3. Postvoid residual in the bladder as above. 4. Echogenic liver likely related to fatty infiltration.   Electronically Signed By: Ronney Asters M.D. On:  06/21/2021 23:19  No results found for this or any previous visit.  No results found for this or any previous visit.  Results for orders placed during the hospital encounter of 12/23/16  CT Renal Stone Study  Narrative CLINICAL DATA:  Acute onset of left flank pain, fever, nausea and vomiting. Leukocytosis. Initial encounter.  EXAM: CT ABDOMEN AND PELVIS WITHOUT CONTRAST  TECHNIQUE: Multidetector CT imaging of the abdomen and pelvis was performed following the standard protocol without IV contrast.  COMPARISON:  CT of the abdomen and pelvis performed 10/04/2016  FINDINGS: Lower chest: The visualized lung bases are grossly clear. The visualized portions of the mediastinum are unremarkable.  Hepatobiliary: The liver is unremarkable in appearance. The gallbladder is unremarkable in appearance. The common bile duct remains  normal in caliber.  Pancreas: The pancreas is within normal limits.  Spleen: The spleen is unremarkable in appearance.  Adrenals/Urinary Tract: The adrenal glands are unremarkable in appearance.  Moderate to severe left-sided hydronephrosis is noted, with left-sided perinephric stranding. There is distention of the left ureter along its entire course. No distal obstructing stone is seen. This may reflect a recently passed stone.  Mild right-sided hydronephrosis is noted, reflecting an obstructing 8 x 5 mm stone proximally at the right ureteropelvic junction. Scattered nonobstructing stones are noted within the left kidney, measuring up to 5 mm in size.  Stomach/Bowel: The stomach is unremarkable in appearance. The small bowel is within normal limits. The appendix is not visualized; there is no evidence for appendicitis. The colon is unremarkable in appearance.  Vascular/Lymphatic: Scattered calcification is seen along the abdominal aorta and its branches. The abdominal aorta is otherwise grossly unremarkable. The inferior vena cava is  grossly unremarkable. No retroperitoneal lymphadenopathy is seen. No pelvic sidewall lymphadenopathy is identified.  Reproductive: The bladder is mildly distended and grossly unremarkable. The prostate is enlarged, measuring 5.1 cm in transverse dimension.  Other: A moderate umbilical hernia is noted, containing only fat.  Musculoskeletal: No acute osseous abnormalities are identified. The visualized musculature is unremarkable in appearance.  IMPRESSION: 1. Moderate to severe left-sided hydronephrosis. Distention of the left ureter along its entire course. No distal obstructing stone seen. This may reflect a recently passed stone. 2. Mild right-sided hydronephrosis, reflecting an obstructing 8 x 5 mm stone proximally at the right ureteropelvic junction. Would follow the patient's renal function closely, given bilateral hydronephrosis. 3. Scattered nonobstructing left renal stones measure up to 5 mm in size. 4. Scattered aortic atherosclerosis. 5. Enlarged prostate noted. 6. Moderate umbilical hernia, containing only fat.   Electronically Signed By: Garald Balding M.D. On: 12/23/2016 06:16   Assessment & Plan:    1. Calculus, renal -KUB, will call with results.  -We discussed the management of kidney stones. These options include observation, ureteroscopy, shockwave lithotripsy (ESWL) and percutaneous nephrolithotomy (PCNL). We discussed which options are relevant to the patient's stone(s). We discussed the natural history of kidney stones as well as the complications of untreated stones and the impact on quality of life without treatment as well as with each of the above listed treatments. We also discussed the efficacy of each treatment in its ability to clear the stone burden. With any of these management options I discussed the signs and symptoms of infection and the need for emergent treatment should these be experienced. For each option we discussed the ability of each  procedure to clear the patient of their stone burden.   For observation I described the risks which include but are not limited to silent renal damage, life-threatening infection, need for emergent surgery, failure to pass stone and pain.   For ureteroscopy I described the risks which include bleeding, infection, damage to contiguous structures, positioning injury, ureteral stricture, ureteral avulsion, ureteral injury, need for prolonged ureteral stent, inability to perform ureteroscopy, need for an interval procedure, inability to clear stone burden, stent discomfort/pain, heart attack, stroke, pulmonary embolus and the inherent risks with general anesthesia.   For shockwave lithotripsy I described the risks which include arrhythmia, kidney contusion, kidney hemorrhage, need for transfusion, pain, inability to adequately break up stone, inability to pass stone fragments, Steinstrasse, infection associated with obstructing stones, need for alternate surgical procedure, need for repeat shockwave lithotripsy, MI, CVA, PE and the inherent risks with anesthesia/conscious sedation.  For PCNL I described the risks including positioning injury, pneumothorax, hydrothorax, need for chest tube, inability to clear stone burden, renal laceration, arterial venous fistula or malformation, need for embolization of kidney, loss of kidney or renal function, need for repeat procedure, need for prolonged nephrostomy tube, ureteral avulsion, MI, CVA, PE and the inherent risks of general anesthesia.   - The patient would like to proceed with medical expulsive therapy. RTc 2 weeks with KUB - Urinalysis, Routine w reflex microscopic - Abdomen 1 view (KUB)  2. Benign prostatic hyperplasia with urinary obstruction -continuie flomax 0.4mg  daily  3. Weak urinary stream -continue flomax 0.4mg  daily   No follow-ups on file.  Nicolette Bang, MD  [facility] [place]

## 2021-06-24 ENCOUNTER — Telehealth: Payer: Self-pay

## 2021-06-24 NOTE — Telephone Encounter (Signed)
Patient's wife called requesting xray results.  Please advise.

## 2021-06-25 ENCOUNTER — Telehealth: Payer: Self-pay

## 2021-06-25 DIAGNOSIS — N200 Calculus of kidney: Secondary | ICD-10-CM

## 2021-06-25 NOTE — Telephone Encounter (Signed)
Reviewed KUB with Dr [name].  Patient notified of results per Dr. [name] proximal 7mm stone present.   Patient will have KUB upon return in 2 weeks.

## 2021-06-25 NOTE — Telephone Encounter (Signed)
Wife called to check results of xray. Radiology has not released images yet, she was advised Dr. [name] will review once the images are released.

## 2021-07-13 ENCOUNTER — Ambulatory Visit: Payer: 59 | Admitting: Urology

## 2021-07-13 ENCOUNTER — Encounter: Payer: Self-pay | Admitting: Urology

## 2021-07-13 ENCOUNTER — Ambulatory Visit (HOSPITAL_COMMUNITY)
Admission: RE | Admit: 2021-07-13 | Discharge: 2021-07-13 | Disposition: A | Discharge status: Home / Self Care | Payer: 59 | Source: Ambulatory Visit | Attending: Urology | Admitting: Urology

## 2021-07-13 ENCOUNTER — Other Ambulatory Visit: Payer: Self-pay

## 2021-07-13 VITALS — BP 137/74 | HR 111

## 2021-07-13 DIAGNOSIS — Z87442 Personal history of urinary calculi: Secondary | ICD-10-CM | POA: Diagnosis not present

## 2021-07-13 DIAGNOSIS — R1012 Left upper quadrant pain: Secondary | ICD-10-CM | POA: Diagnosis not present

## 2021-07-13 DIAGNOSIS — N200 Calculus of kidney: Secondary | ICD-10-CM | POA: Diagnosis not present

## 2021-07-13 DIAGNOSIS — N2889 Other specified disorders of kidney and ureter: Secondary | ICD-10-CM | POA: Diagnosis not present

## 2021-07-13 MED ORDER — TAMSULOSIN HCL 0.4 MG PO CAPS: 0.4000 mg | ORAL_CAPSULE | Freq: Every day | ORAL | 3 refills | Status: DC

## 2021-07-13 NOTE — Patient Instructions (Signed)
Dietary Guidelines to Help Prevent Kidney Stones Kidney stones are deposits of minerals and salts that form inside your kidneys. Your risk of developing kidney stones may be greater depending on your diet, your lifestyle, the medicines you take, and whether you have certain medical conditions. Most people can lower their chances of developing kidney stones by following the instructions below. Your dietitian may give you more specific instructions depending on your overall health and the type of kidney stones you tend to develop. What are tips for following this plan? Reading food labels  Choose foods with "no salt added" or "low-salt" labels. Limit your salt (sodium) intake to less than 1,500 mg a day. Choose foods with calcium for each meal and snack. Try to eat about 300 mg of calcium at each meal. Foods that contain 200-500 mg of calcium a serving include: 8 oz (237 mL) of milk, calcium-fortifiednon-dairy milk, and calcium-fortifiedfruit juice. Calcium-fortified means that calcium has been added to these drinks. 8 oz (237 mL) of kefir, yogurt, and soy yogurt. 4 oz (114 g) of tofu. 1 oz (28 g) of cheese. 1 cup (150 g) of dried figs. 1 cup (91 g) of cooked broccoli. One 3 oz (85 g) can of sardines or mackerel. Most people need 1,000-1,500 mg of calcium a day. Talk to your dietitian about how much calcium is recommended for you. Shopping Buy plenty of fresh fruits and vegetables. Most people do not need to avoid fruits and vegetables, even if these foods contain nutrients that may contribute to kidney stones. When shopping for convenience foods, choose: Whole pieces of fruit. Pre-made salads with dressing on the side. Low-fat fruit and yogurt smoothies. Avoid buying frozen meals or prepared deli foods. These can be high in sodium. Look for foods with live cultures, such as yogurt and kefir. Choose high-fiber grains, such as whole-wheat breads, oat bran, and wheat cereals. Cooking Do not add  salt to food when cooking. Place a salt shaker on the table and allow each person to add his or her own salt to taste. Use vegetable protein, such as beans, textured vegetable protein (TVP), or tofu, instead of meat in pasta, casseroles, and soups. Meal planning Eat less salt, if told by your dietitian. To do this: Avoid eating processed or pre-made food. Avoid eating fast food. Eat less animal protein, including cheese, meat, poultry, or fish, if told by your dietitian. To do this: Limit the number of times you have meat, poultry, fish, or cheese each week. Eat a diet free of meat at least 2 days a week. Eat only one serving each day of meat, poultry, fish, or seafood. When you prepare animal protein, cut pieces into small portion sizes. For most meat and fish, one serving is about the size of the palm of your hand. Eat at least five servings of fresh fruits and vegetables each day. To do this: Keep fruits and vegetables on hand for snacks. Eat one piece of fruit or a handful of berries with breakfast. Have a salad and fruit at lunch. Have two kinds of vegetables at dinner. Limit foods that are high in a substance called oxalate. These include: Spinach (cooked), rhubarb, beets, sweet potatoes, and Swiss chard. Peanuts. Potato chips, french fries, and baked potatoes with skin on. Nuts and nut products. Chocolate. If you regularly take a diuretic medicine, make sure to eat at least 1 or 2 servings of fruits or vegetables that are high in potassium each day. These include: Avocado. Banana. Orange, prune,   carrot, or tomato juice. Baked potato. Cabbage. Beans and split peas. Lifestyle  Drink enough fluid to keep your urine pale yellow. This is the most important thing you can do. Spread your fluid intake throughout the day. If you drink alcohol: Limit how much you use to: 0-1 drink a day for women who are not pregnant. 0-2 drinks a day for men. Be aware of how much alcohol is in your  drink. In the [place] one drink equals one 12 oz bottle of beer (355 mL), one 5 oz glass of wine (148 mL), or one 1 oz glass of hard liquor (44 mL). Lose weight if told by your health care provider. Work with your dietitian to find an eating plan and weight loss strategies that work best for you. General information Talk to your health care provider and dietitian about taking daily supplements. You may be told the following depending on your health and the cause of your kidney stones: Not to take supplements with vitamin C. To take a calcium supplement. To take a daily probiotic supplement. To take other supplements such as magnesium, fish oil, or vitamin B6. Take over-the-counter and prescription medicines only as told by your health care provider. These include supplements. What foods should I limit? Limit your intake of the following foods, or eat them as told by your dietitian. Vegetables Spinach. Rhubarb. Beets. Canned vegetables. Pickles. Olives. Baked potatoes with skin. Grains Wheat bran. Baked goods. Salted crackers. Cereals high in sugar. Meats and other proteins Nuts. Nut butters. Large portions of meat, poultry, or fish. Salted, precooked, or cured meats, such as sausages, meat loaves, and hot dogs. Dairy Cheese. Beverages Regular soft drinks. Regular vegetable juice. Seasonings and condiments Seasoning blends with salt. Salad dressings. Soy sauce. Ketchup. Barbecue sauce. Other foods Canned soups. Canned pasta sauce. Casseroles. Pizza. Lasagna. Frozen meals. Potato chips. French fries. The items listed above may not be a complete list of foods and beverages you should limit. Contact a dietitian for more information. What foods should I avoid? Talk to your dietitian about specific foods you should avoid based on the type of kidney stones you have and your overall health. Fruits Grapefruit. The item listed above may not be a complete list of foods and beverages you should  avoid. Contact a dietitian for more information. Summary Kidney stones are deposits of minerals and salts that form inside your kidneys. You can lower your risk of kidney stones by making changes to your diet. The most important thing you can do is drink enough fluid. Drink enough fluid to keep your urine pale yellow. Talk to your dietitian about how much calcium you should have each day, and eat less salt and animal protein as told by your dietitian. This information is not intended to replace advice given to you by your health care provider. Make sure you discuss any questions you have with your health care provider. Document Revised: 04/25/2019 Document Reviewed: 04/25/2019 Elsevier Patient Education  2022 Elsevier Inc.  

## 2021-07-13 NOTE — Progress Notes (Signed)
07/13/2021 11:21 AM   Austin Gould 1956-05-05 [id number]  Referring provider: Christain Sacramento, MD [address] [place],   [address]  Followup nephrolithiasis   HPI: Mr Kocinski is a 66yo here for followup for nephrolithiasis. He has not passed his calculus. He denies any flank pain. KUb from today shows no obvious ureteral calculi. He denies any worsening LUTS. No hematuria or dysuria   PMH: Past Medical History:  Diagnosis Date   Anemia 12/23/2016   IRON SUPPLEMENT   Bilateral ureteral calculi 10/04/2016   Diabetes mellitus    type 2   GERD (gastroesophageal reflux disease)    PRN MEDS as need   History of kidney stones 10/04/2016   bil. per CT   Hyperlipidemia    Hypertension    Umbilical hernia     Surgical History: Past Surgical History:  Procedure Laterality Date   COLONOSCOPY     CYSTOSCOPY W/ URETERAL STENT PLACEMENT Bilateral 10/04/2016   Procedure: CYSTOSCOPY WITH RETROGRADE PYELOGRAM/BILATERAL URETERAL STENT PLACEMENT;  Surgeon: Kathie Rhodes, MD;  Location: [facility];  Service: Urology;  Laterality: Bilateral;   CYSTOSCOPY W/ URETERAL STENT PLACEMENT Bilateral 12/23/2016   Procedure: CYSTOSCOPY WITH RETROGRADE PYELOGRAM/URETERAL BILATERAL STENT PLACEMENT;  Surgeon: Cleon Gustin, MD;  Location: [facility];  Service: Urology;  Laterality: Bilateral;   CYSTOSCOPY WITH RETROGRADE PYELOGRAM, URETEROSCOPY AND STENT PLACEMENT Bilateral 11/25/2016   Procedure: CYSTOSCOPY BILATERAL STENT REMOVAL  BILATERAL, URETEROSCOPY STONE REMOVAL  AND left  STENT REPLACEMENT with holmium laser;  Surgeon: Kathie Rhodes, MD;  Location: [place];  Service: Urology;  Laterality: Bilateral;   CYSTOSCOPY WITH RETROGRADE PYELOGRAM, URETEROSCOPY AND STENT PLACEMENT Bilateral 02/13/2017   Procedure: CYSTOSCOPY WITH RETROGRADE PYELOGRAM, URETEROSCOPY STONE BASKETRY AND STENT REPLACEMENT;  Surgeon: Cleon Gustin, MD;  Location: [facility];   Service: Urology;  Laterality: Bilateral;   CYSTOSCOPY WITH URETEROSCOPY  10/19/2011   Procedure: CYSTOSCOPY WITH URETEROSCOPY;  Surgeon: Molli Hazard, MD;  Location: [facility];  Service: Urology;  Laterality: Left;  cystoscopy, left ureteroscopy, holmium laser, lithotripsey left ureter stent placement    EXTRACORPOREAL SHOCK WAVE LITHOTRIPSY Left 08/18/2011   EXTRACORPOREAL SHOCK WAVE LITHOTRIPSY Left 10/17/2016   Procedure: LEFT EXTRACORPOREAL SHOCK WAVE LITHOTRIPSY (ESWL);  Surgeon: Alexis Frock, MD;  [facility];  Service: Urology;  Laterality: Left;   HOLMIUM LASER APPLICATION Bilateral [id number]   Procedure: HOLMIUM LASER APPLICATION;  Surgeon: Kathie Rhodes, MD;  Location: [facility];  Service: Urology;  Laterality: Bilateral;   HOLMIUM LASER APPLICATION Bilateral [id number]   Procedure: HOLMIUM LASER APPLICATION;  Surgeon: Cleon Gustin, MD;  Location: [facility];  Service: Urology;  Laterality: Bilateral;   INSERTION OF MESH N/A 01/08/2018   Procedure: INSERTION OF MESH;  Surgeon: Ralene Ok, MD;  Location: [place];  Service: General;  Laterality: N/A;   UMBILICAL HERNIA REPAIR N/A 01/08/2018   Procedure: LAPAROSCOPIC UMBILICAL HERNIA REPAIR WITH MESH;  Surgeon: Ralene Ok, MD;  Location: [place];  Service: General;  Laterality: N/A;    Home Medications:  Allergies as of 07/13/2021       Reactions   Ciprofloxacin Hives   Sulfa Antibiotics Hives   Sulfamethoxazole-trimethoprim Hives   Ace Inhibitors Other (See Comments)   unknown   Clarithromycin Other (See Comments)   unknown        Medication List        Accurate as of July 13, 2021 11:21 AM. If you have  any questions, ask your nurse or doctor.          allopurinol 300 MG tablet Commonly known as: ZYLOPRIM Take 1 tablet (300 mg total) by mouth daily.   aspirin EC 81 MG tablet Take 81 mg by mouth daily.   atorvastatin 40 MG  tablet Commonly known as: LIPITOR Take 40 mg by mouth every morning.   esomeprazole 40 MG capsule Commonly known as: NEXIUM Take 40 mg by mouth daily as needed (reflux).   famotidine 20 MG tablet Commonly known as: PEPCID Take 20 mg by mouth 2 (two) times daily as needed for heartburn. Heart burn   Fish Oil 1000 MG Cpdr Take 1,000 mg by mouth 2 (two) times daily.   GARLIC PO Take 1 capsule by mouth 2 (two) times daily.   Januvia 100 MG tablet Generic drug: sitaGLIPtin Take 100 mg by mouth daily.   metFORMIN 1000 MG tablet Commonly known as: GLUCOPHAGE Take 1,000 mg by mouth 2 (two) times daily with a meal.   olmesartan 40 MG tablet Commonly known as: BENICAR Take 40 mg by mouth every evening.   oxyCODONE-acetaminophen 5-325 MG tablet Commonly known as: Percocet Take 1 tablet by mouth every 4 (four) hours as needed.   pioglitazone 15 MG tablet Commonly known as: ACTOS Take 15 mg by mouth daily.   pioglitazone 45 MG tablet Commonly known as: ACTOS Take 45 mg by mouth daily.   sodium bicarbonate 650 MG tablet Take 1 tablet (650 mg total) by mouth 2 (two) times daily.   tamsulosin 0.4 MG Caps capsule Commonly known as: FLOMAX Take 1 capsule (0.4 mg total) by mouth at bedtime.   telmisartan 80 MG tablet Commonly known as: MICARDIS        Allergies:  Allergies  Allergen Reactions   Ciprofloxacin Hives   Sulfa Antibiotics Hives   Sulfamethoxazole-Trimethoprim Hives   Ace Inhibitors Other (See Comments)    unknown   Clarithromycin Other (See Comments)    unknown    Family History: No family history on file.  Social History:  reports that he has quit smoking. His smoking use included cigarettes. He has never used smokeless tobacco. He reports that he does not drink alcohol and does not use drugs.  ROS: All other review of systems were reviewed and are negative except what is noted above in HPI  Physical Exam: BP 137/74    Pulse (!) 111    Constitutional:  Alert and oriented, No acute distress. HEENT: [place] AT, moist mucus membranes.  Trachea midline, no masses. Cardiovascular: No clubbing, cyanosis, or edema. Respiratory: Normal respiratory effort, no increased work of breathing. GI: Abdomen is soft, nontender, nondistended, no abdominal masses GU: No CVA tenderness.  Lymph: No cervical or inguinal lymphadenopathy. Skin: No rashes, bruises or suspicious lesions. Neurologic: Grossly intact, no focal deficits, moving all 4 extremities. Psychiatric: Normal mood and affect.  Laboratory Data: Lab Results  Component Value Date   WBC 9.1 12/29/2017   HGB 14.2 12/29/2017   HCT 46.8 12/29/2017   MCV 89.0 12/29/2017   PLT 297 12/29/2017    Lab Results  Component Value Date   CREATININE 1.26 (H) 12/29/2017    No results found for: PSA  No results found for: TESTOSTERONE  Lab Results  Component Value Date   HGBA1C 9.8 (H) 12/29/2017    Urinalysis    Component Value Date/Time   COLORURINE YELLOW 12/23/2016 0115   APPEARANCEUR Clear 10/30/2020 1024   LABSPEC 1.015 12/23/2016 0115  PHURINE 5.0 12/23/2016 0115   GLUCOSEU 2+ (A) 10/30/2020 1024   HGBUR MODERATE (A) 12/23/2016 0115   BILIRUBINUR Negative 10/30/2020 1024   KETONESUR 5 (A) 12/23/2016 0115   PROTEINUR Negative 10/30/2020 1024   PROTEINUR 30 (A) 12/23/2016 0115   NITRITE Negative 10/30/2020 1024   NITRITE POSITIVE (A) 12/23/2016 0115   LEUKOCYTESUR Negative 10/30/2020 1024    Lab Results  Component Value Date   LABMICR See below: 05/01/2020   WBCUA None seen 05/01/2020   LABEPIT 0-10 05/01/2020   MUCUS Present 05/01/2020   BACTERIA Few 05/01/2020    Pertinent Imaging: KUb today: Images reviewed and discussed with the patient Results for orders placed in visit on 06/23/21  Abdomen 1 view (KUB)  Narrative CLINICAL DATA:  History of nephrolithiasis  EXAM: ABDOMEN - 1 VIEW  COMPARISON:  11/25/2016, ultrasound from  06/21/2021  FINDINGS: Previously seen ureteral stents have been removed in the interval. No definitive renal or ureteral stones are seen. No obstructive changes are noted. Fecal material is noted throughout the colon. Mild osteophytic changes of lumbar spine are noted.  IMPRESSION: No definitive renal or ureteral stones are noted.   Electronically Signed By: Alcide CleverMark  Lukens M.D. On: 06/25/2021 08:45  No results found for this or any previous visit.  No results found for this or any previous visit.  No results found for this or any previous visit.  Results for orders placed during the hospital encounter of 06/21/21  Ultrasound renal complete  Narrative CLINICAL DATA:  Nephrolithiasis.  EXAM: RENAL / URINARY TRACT ULTRASOUND COMPLETE  COMPARISON:  Renal ultrasound 10/23/2020. CT renal stone 12/23/2016.  FINDINGS: Right Kidney:  Renal measurements: 10.4 x 4.9 x 6.8 cm = volume: 182 mL. Echogenicity within normal limits. No mass or hydronephrosis visualized.  Left Kidney:  Renal measurements: 11.6 x 7.2 x 5.8 cm = volume: 251 mL. Echogenicity within normal limits. Mild hydronephrosis. No mass visualized.  Bladder:  Appears normal for Gould of bladder distention. Postvoid residual 99 CT.  Other:  Echogenic liver.  IMPRESSION: 1. Mild left-sided hydronephrosis. 2. No renal calculi visualized. 3. Postvoid residual in the bladder as above. 4. Echogenic liver likely related to fatty infiltration.   Electronically Signed By: Darliss CheneyAmy  Guttmann M.D. On: 06/21/2021 23:19  No results found for this or any previous visit.  No results found for this or any previous visit.  Results for orders placed during the hospital encounter of 12/23/16  CT Renal Stone Study  Narrative CLINICAL DATA:  Acute onset of left flank pain, fever, nausea and vomiting. Leukocytosis. Initial encounter.  EXAM: CT ABDOMEN AND PELVIS WITHOUT CONTRAST  TECHNIQUE: Multidetector CT  imaging of the abdomen and pelvis was performed following the standard protocol without IV contrast.  COMPARISON:  CT of the abdomen and pelvis performed 10/04/2016  FINDINGS: Lower chest: The visualized lung bases are grossly clear. The visualized portions of the mediastinum are unremarkable.  Hepatobiliary: The liver is unremarkable in appearance. The gallbladder is unremarkable in appearance. The common bile duct remains normal in caliber.  Pancreas: The pancreas is within normal limits.  Spleen: The spleen is unremarkable in appearance.  Adrenals/Urinary Tract: The adrenal glands are unremarkable in appearance.  Moderate to severe left-sided hydronephrosis is noted, with left-sided perinephric stranding. There is distention of the left ureter along its entire course. No distal obstructing stone is seen. This may reflect a recently passed stone.  Mild right-sided hydronephrosis is noted, reflecting an obstructing 8 x 5 mm stone proximally at  the right ureteropelvic junction. Scattered nonobstructing stones are noted within the left kidney, measuring up to 5 mm in size.  Stomach/Bowel: The stomach is unremarkable in appearance. The small bowel is within normal limits. The appendix is not visualized; there is no evidence for appendicitis. The colon is unremarkable in appearance.  Vascular/Lymphatic: Scattered calcification is seen along the abdominal aorta and its branches. The abdominal aorta is otherwise grossly unremarkable. The inferior vena cava is grossly unremarkable. No retroperitoneal lymphadenopathy is seen. No pelvic sidewall lymphadenopathy is identified.  Reproductive: The bladder is mildly distended and grossly unremarkable. The prostate is enlarged, measuring 5.1 cm in transverse dimension.  Other: A moderate umbilical hernia is noted, containing only fat.  Musculoskeletal: No acute osseous abnormalities are identified. The visualized musculature is  unremarkable in appearance.  IMPRESSION: 1. Moderate to severe left-sided hydronephrosis. Distention of the left ureter along its entire course. No distal obstructing stone seen. This may reflect a recently passed stone. 2. Mild right-sided hydronephrosis, reflecting an obstructing 8 x 5 mm stone proximally at the right ureteropelvic junction. Would follow the patient's renal function closely, given bilateral hydronephrosis. 3. Scattered nonobstructing left renal stones measure up to 5 mm in size. 4. Scattered aortic atherosclerosis. 5. Enlarged prostate noted. 6. Moderate umbilical hernia, containing only fat.   Electronically Signed By: Garald Balding M.D. On: 12/23/2016 06:16   Assessment & Plan:    1. Calculus, renal -continue medical expulsive therapy -RTC 2-3 weeks with CT stone study     No follow-ups on file.  Nicolette Bang, MD  [facility] [place]

## 2021-07-16 ENCOUNTER — Other Ambulatory Visit: Payer: Self-pay

## 2021-07-16 ENCOUNTER — Ambulatory Visit (HOSPITAL_COMMUNITY): Payer: 59

## 2021-07-16 ENCOUNTER — Ambulatory Visit (HOSPITAL_COMMUNITY)
Admission: RE | Admit: 2021-07-16 | Discharge: 2021-07-16 | Disposition: A | Discharge status: Home / Self Care | Payer: 59 | Source: Ambulatory Visit | Attending: Urology | Admitting: Urology

## 2021-07-16 DIAGNOSIS — K3189 Other diseases of stomach and duodenum: Secondary | ICD-10-CM | POA: Diagnosis not present

## 2021-07-16 DIAGNOSIS — R1012 Left upper quadrant pain: Secondary | ICD-10-CM | POA: Diagnosis not present

## 2021-07-16 DIAGNOSIS — R3129 Other microscopic hematuria: Secondary | ICD-10-CM | POA: Diagnosis not present

## 2021-07-16 DIAGNOSIS — N200 Calculus of kidney: Secondary | ICD-10-CM | POA: Diagnosis not present

## 2021-07-16 DIAGNOSIS — N400 Enlarged prostate without lower urinary tract symptoms: Secondary | ICD-10-CM | POA: Diagnosis not present

## 2021-08-03 ENCOUNTER — Ambulatory Visit: Payer: 59 | Admitting: Urology

## 2021-08-06 ENCOUNTER — Ambulatory Visit (INDEPENDENT_AMBULATORY_CARE_PROVIDER_SITE_OTHER): Payer: 59 | Admitting: Urology

## 2021-08-06 ENCOUNTER — Other Ambulatory Visit: Payer: Self-pay

## 2021-08-06 VITALS — BP 144/76 | HR 92

## 2021-08-06 DIAGNOSIS — N200 Calculus of kidney: Secondary | ICD-10-CM | POA: Diagnosis not present

## 2021-08-06 LAB — URINALYSIS, ROUTINE W REFLEX MICROSCOPIC
Bilirubin, UA: NEGATIVE
Glucose, UA: NEGATIVE
Ketones, UA: NEGATIVE
Leukocytes,UA: NEGATIVE
Nitrite, UA: NEGATIVE
Protein,UA: NEGATIVE
RBC, UA: NEGATIVE
Specific Gravity, UA: <1.005 — ABNORMAL LOW (ref 1.005–1.030)
Urobilinogen, Ur: 0.2 mg/dL (ref 0.2–1.0)
pH, UA: 6 (ref 5.0–7.5)

## 2021-08-06 MED ORDER — SODIUM BICARBONATE 650 MG PO TABS: 650.0000 mg | ORAL_TABLET | Freq: Two times a day (BID) | ORAL | 3 refills | Status: DC

## 2021-08-06 MED ORDER — ALLOPURINOL 300 MG PO TABS: 300.0000 mg | ORAL_TABLET | Freq: Every day | ORAL | 3 refills | Status: DC

## 2021-08-06 MED ORDER — TAMSULOSIN HCL 0.4 MG PO CAPS: 0.4000 mg | ORAL_CAPSULE | Freq: Every day | ORAL | 3 refills | Status: DC

## 2021-08-06 NOTE — Patient Instructions (Signed)
Dietary Guidelines to Help Prevent Kidney Stones Kidney stones are deposits of minerals and salts that form inside your kidneys. Your risk of developing kidney stones may be greater depending on your diet, your lifestyle, the medicines you take, and whether you have certain medical conditions. Most people can lower their chances of developing kidney stones by following the instructions below. Your dietitian may give you more specific instructions depending on your overall health and the type of kidney stones you tend to develop. What are tips for following this plan? Reading food labels  Choose foods with "no salt added" or "low-salt" labels. Limit your salt (sodium) intake to less than 1,500 mg a day. Choose foods with calcium for each meal and snack. Try to eat about 300 mg of calcium at each meal. Foods that contain 200-500 mg of calcium a serving include: 8 oz (237 mL) of milk, calcium-fortifiednon-dairy milk, and calcium-fortifiedfruit juice. Calcium-fortified means that calcium has been added to these drinks. 8 oz (237 mL) of kefir, yogurt, and soy yogurt. 4 oz (114 g) of tofu. 1 oz (28 g) of cheese. 1 cup (150 g) of dried figs. 1 cup (91 g) of cooked broccoli. One 3 oz (85 g) can of sardines or mackerel. Most people need 1,000-1,500 mg of calcium a day. Talk to your dietitian about how much calcium is recommended for you. Shopping Buy plenty of fresh fruits and vegetables. Most people do not need to avoid fruits and vegetables, even if these foods contain nutrients that may contribute to kidney stones. When shopping for convenience foods, choose: Whole pieces of fruit. Pre-made salads with dressing on the side. Low-fat fruit and yogurt smoothies. Avoid buying frozen meals or prepared deli foods. These can be high in sodium. Look for foods with live cultures, such as yogurt and kefir. Choose high-fiber grains, such as whole-wheat breads, oat bran, and wheat cereals. Cooking Do not add  salt to food when cooking. Place a salt shaker on the table and allow each person to add his or her own salt to taste. Use vegetable protein, such as beans, textured vegetable protein (TVP), or tofu, instead of meat in pasta, casseroles, and soups. Meal planning Eat less salt, if told by your dietitian. To do this: Avoid eating processed or pre-made food. Avoid eating fast food. Eat less animal protein, including cheese, meat, poultry, or fish, if told by your dietitian. To do this: Limit the number of times you have meat, poultry, fish, or cheese each week. Eat a diet free of meat at least 2 days a week. Eat only one serving each day of meat, poultry, fish, or seafood. When you prepare animal protein, cut pieces into small portion sizes. For most meat and fish, one serving is about the size of the palm of your hand. Eat at least five servings of fresh fruits and vegetables each day. To do this: Keep fruits and vegetables on hand for snacks. Eat one piece of fruit or a handful of berries with breakfast. Have a salad and fruit at lunch. Have two kinds of vegetables at dinner. Limit foods that are high in a substance called oxalate. These include: Spinach (cooked), rhubarb, beets, sweet potatoes, and Swiss chard. Peanuts. Potato chips, french fries, and baked potatoes with skin on. Nuts and nut products. Chocolate. If you regularly take a diuretic medicine, make sure to eat at least 1 or 2 servings of fruits or vegetables that are high in potassium each day. These include: Avocado. Banana. Orange, prune,   carrot, or tomato juice. Baked potato. Cabbage. Beans and split peas. Lifestyle  Drink enough fluid to keep your urine pale yellow. This is the most important thing you can do. Spread your fluid intake throughout the day. If you drink alcohol: Limit how much you use to: 0-1 drink a day for women who are not pregnant. 0-2 drinks a day for men. Be aware of how much alcohol is in your  drink. In the [place] one drink equals one 12 oz bottle of beer (355 mL), one 5 oz glass of wine (148 mL), or one 1 oz glass of hard liquor (44 mL). Lose weight if told by your health care provider. Work with your dietitian to find an eating plan and weight loss strategies that work best for you. General information Talk to your health care provider and dietitian about taking daily supplements. You may be told the following depending on your health and the cause of your kidney stones: Not to take supplements with vitamin C. To take a calcium supplement. To take a daily probiotic supplement. To take other supplements such as magnesium, fish oil, or vitamin B6. Take over-the-counter and prescription medicines only as told by your health care provider. These include supplements. What foods should I limit? Limit your intake of the following foods, or eat them as told by your dietitian. Vegetables Spinach. Rhubarb. Beets. Canned vegetables. Pickles. Olives. Baked potatoes with skin. Grains Wheat bran. Baked goods. Salted crackers. Cereals high in sugar. Meats and other proteins Nuts. Nut butters. Large portions of meat, poultry, or fish. Salted, precooked, or cured meats, such as sausages, meat loaves, and hot dogs. Dairy Cheese. Beverages Regular soft drinks. Regular vegetable juice. Seasonings and condiments Seasoning blends with salt. Salad dressings. Soy sauce. Ketchup. Barbecue sauce. Other foods Canned soups. Canned pasta sauce. Casseroles. Pizza. Lasagna. Frozen meals. Potato chips. French fries. The items listed above may not be a complete list of foods and beverages you should limit. Contact a dietitian for more information. What foods should I avoid? Talk to your dietitian about specific foods you should avoid based on the type of kidney stones you have and your overall health. Fruits Grapefruit. The item listed above may not be a complete list of foods and beverages you should  avoid. Contact a dietitian for more information. Summary Kidney stones are deposits of minerals and salts that form inside your kidneys. You can lower your risk of kidney stones by making changes to your diet. The most important thing you can do is drink enough fluid. Drink enough fluid to keep your urine pale yellow. Talk to your dietitian about how much calcium you should have each day, and eat less salt and animal protein as told by your dietitian. This information is not intended to replace advice given to you by your health care provider. Make sure you discuss any questions you have with your health care provider. Document Revised: 04/25/2019 Document Reviewed: 04/25/2019 Elsevier Patient Education  2022 Elsevier Inc.  

## 2021-08-06 NOTE — Progress Notes (Signed)
? ?08/06/2021 ?10:25 AM  ? ?Austin Gould ?1956-01-12 ?[id number] ? ?Referring provider: Christain Sacramento, MD ?[address] ?[place],  [place] [address] ? ?Followup nephrolithiasis ? ? ?HPI: ?Mr Austin Gould is a 66yo here for followup for nephrolithiasis. CT stone studay shows he passed his left ureteral calculus. He denies any flank pain. No worsenign LUTS. CT does shows a 3-17mm left lower pole calculus. No other complaints today ? ? ?PMH: ?Past Medical History:  ?Diagnosis Date  ? Anemia 12/23/2016  ? IRON SUPPLEMENT  ? Bilateral ureteral calculi 10/04/2016  ? Diabetes mellitus   ? type 2  ? GERD (gastroesophageal reflux disease)   ? PRN MEDS as need  ? History of kidney stones 10/04/2016  ? bil. per CT  ? Hyperlipidemia   ? Hypertension   ? Umbilical hernia   ? ? ?Surgical History: ?Past Surgical History:  ?Procedure Laterality Date  ? COLONOSCOPY    ? CYSTOSCOPY W/ URETERAL STENT PLACEMENT Bilateral 10/04/2016  ? Procedure: CYSTOSCOPY WITH RETROGRADE PYELOGRAM/BILATERAL URETERAL STENT PLACEMENT;  Surgeon: Kathie Rhodes, MD;  [facility];  Service: Urology;  Laterality: Bilateral;  ? CYSTOSCOPY W/ URETERAL STENT PLACEMENT Bilateral 12/23/2016  ? Procedure: CYSTOSCOPY WITH RETROGRADE PYELOGRAM/URETERAL BILATERAL STENT PLACEMENT;  Surgeon: Cleon Gustin, MD;  [facility];  Service: Urology;  Laterality: Bilateral;  ? CYSTOSCOPY WITH RETROGRADE PYELOGRAM, URETEROSCOPY AND STENT PLACEMENT Bilateral 11/25/2016  ? Procedure: CYSTOSCOPY BILATERAL STENT REMOVAL  BILATERAL, URETEROSCOPY STONE REMOVAL  AND left  STENT REPLACEMENT with holmium laser;  Surgeon: Kathie Rhodes, MD;  Location: [place];  Service: Urology;  Laterality: Bilateral;  ? CYSTOSCOPY WITH RETROGRADE PYELOGRAM, URETEROSCOPY AND STENT PLACEMENT Bilateral 02/13/2017  ? Procedure: CYSTOSCOPY WITH RETROGRADE PYELOGRAM, URETEROSCOPY STONE BASKETRY AND STENT REPLACEMENT;  Surgeon: Cleon Gustin, MD;  Location: [facility];  Service: Urology;  Laterality: Bilateral;  ? CYSTOSCOPY WITH URETEROSCOPY  10/19/2011  ? Procedure: CYSTOSCOPY WITH URETEROSCOPY;  Surgeon: Molli Hazard, MD;  Location: [facility];  Service: Urology;  Laterality: Left;  cystoscopy, left ureteroscopy, holmium laser, lithotripsey left ureter stent placement   ? EXTRACORPOREAL SHOCK WAVE LITHOTRIPSY Left 08/18/2011  ? EXTRACORPOREAL SHOCK WAVE LITHOTRIPSY Left 10/17/2016  ? Procedure: LEFT EXTRACORPOREAL SHOCK WAVE LITHOTRIPSY (ESWL);  Surgeon: Alexis Frock, MD;  [facility];  Service: Urology;  Laterality: Left;  ? HOLMIUM LASER APPLICATION Bilateral [id number]  ? Procedure: HOLMIUM LASER APPLICATION;  Surgeon: Kathie Rhodes, MD;  Location: [facility];  Service: Urology;  Laterality: Bilateral;  ? HOLMIUM LASER APPLICATION Bilateral [id number]  ? Procedure: HOLMIUM LASER APPLICATION;  Surgeon: Cleon Gustin, MD;  Location[facility];  Service: Urology;  Laterality: Bilateral;  ? INSERTION OF MESH N/A 01/08/2018  ? Procedure: INSERTION OF MESH;  Surgeon: Ralene Ok, MD;  Location: [place];  Service: General;  Laterality: N/A;  ? UMBILICAL HERNIA REPAIR N/A 01/08/2018  ? Procedure: LAPAROSCOPIC UMBILICAL HERNIA REPAIR WITH MESH;  Surgeon: Ralene Ok, MD;  Location: [place];  Service: General;  Laterality: N/A;  ? ? ?Home Medications:  ?Allergies as of 08/06/2021   ? ?   Reactions  ? Ciprofloxacin Hives  ? Sulfa Antibiotics Hives  ? Sulfamethoxazole-trimethoprim Hives  ? Ace Inhibitors Other (See Comments)  ? unknown  ? Clarithromycin Other (See Comments)  ? unknown  ? ?  ? ?  ?Medication List  ?  ? ?  ? Accurate as of August 06, 2021 10:25 AM.  If you have any questions, ask your nurse or doctor.  ?  ?  ? ?  ? ?allopurinol 300 MG tablet ?Commonly known as: ZYLOPRIM ?Take 1 tablet (300 mg total) by mouth daily. ?  ?aspirin EC 81 MG tablet ?Take 81 mg by mouth daily. ?  ?atorvastatin 40 MG  tablet ?Commonly known as: LIPITOR ?Take 40 mg by mouth every morning. ?  ?esomeprazole 40 MG capsule ?Commonly known as: [place] ?Take 40 mg by mouth daily as needed (reflux). ?  ?famotidine 20 MG tablet ?Commonly known as: PEPCID ?Take 20 mg by mouth 2 (two) times daily as needed for heartburn. Heart burn ?  ?Fish Oil 1000 MG Cpdr ?Take 1,000 mg by mouth 2 (two) times daily. ?  ?GARLIC PO ?Take 1 capsule by mouth 2 (two) times daily. ?  ?Januvia 100 MG tablet ?Generic drug: sitaGLIPtin ?Take 100 mg by mouth daily. ?  ?metFORMIN 1000 MG tablet ?Commonly known as: GLUCOPHAGE ?Take 1,000 mg by mouth 2 (two) times daily with a meal. ?  ?olmesartan 40 MG tablet ?Commonly known as: BENICAR ?Take 40 mg by mouth every evening. ?  ?oxyCODONE-acetaminophen 5-325 MG tablet ?Commonly known as: Percocet ?Take 1 tablet by mouth every 4 (four) hours as needed. ?  ?pioglitazone 15 MG tablet ?Commonly known as: ACTOS ?Take 15 mg by mouth daily. ?  ?pioglitazone 45 MG tablet ?Commonly known as: ACTOS ?Take 45 mg by mouth daily. ?  ?sodium bicarbonate 650 MG tablet ?Take 1 tablet (650 mg total) by mouth 2 (two) times daily. ?  ?tamsulosin 0.4 MG Caps capsule ?Commonly known as: FLOMAX ?Take 1 capsule (0.4 mg total) by mouth at bedtime. ?  ?telmisartan 80 MG tablet ?Commonly known as: MICARDIS ?  ? ?  ? ? ?Allergies:  ?Allergies  ?Allergen Reactions  ? Ciprofloxacin Hives  ? Sulfa Antibiotics Hives  ? Sulfamethoxazole-Trimethoprim Hives  ? Ace Inhibitors Other (See Comments)  ?  unknown  ? Clarithromycin Other (See Comments)  ?  unknown  ? ? ?Family History: ?No family history on file. ? ?Social History:  reports that he has quit smoking. His smoking use included cigarettes. He has never used smokeless tobacco. He reports that he does not drink alcohol and does not use drugs. ? ?ROS: ?All other review of systems were reviewed and are negative except what is noted above in HPI ? ?Physical Exam: ?BP (!) 144/76   Pulse 92    ?Constitutional:  Alert and oriented, No acute distress. ?HEENT: [place] AT, moist mucus membranes.  Trachea midline, no masses. ?Cardiovascular: No clubbing, cyanosis, or edema. ?Respiratory: Normal respiratory effort, no increased work of breathing. ?GI: Abdomen is soft, nontender, nondistended, no abdominal masses ?GU: No CVA tenderness.  ?Lymph: No cervical or inguinal lymphadenopathy. ?Skin: No rashes, bruises or suspicious lesions. ?Neurologic: Grossly intact, no focal deficits, moving all 4 extremities. ?Psychiatric: Normal mood and affect. ? ?Laboratory Data: ?Lab Results  ?Component Value Date  ? WBC 9.1 12/29/2017  ? HGB 14.2 12/29/2017  ? HCT 46.8 12/29/2017  ? MCV 89.0 12/29/2017  ? PLT 297 12/29/2017  ? ? ?Lab Results  ?Component Value Date  ? CREATININE 1.26 (H) 12/29/2017  ? ? ?No results found for: PSA ? ?No results found for: TESTOSTERONE ? ?Lab Results  ?Component Value Date  ? HGBA1C 9.8 (H) 12/29/2017  ? ? ?Urinalysis ?   ?Component Value Date/Time  ? [place] YELLOW 12/23/2016 0115  ? APPEARANCEUR Clear 10/30/2020 1024  ? LABSPEC 1.015 12/23/2016 0115  ?  PHURINE 5.0 12/23/2016 0115  ? GLUCOSEU 2+ (A) 10/30/2020 1024  ? HGBUR MODERATE (A) 12/23/2016 0115  ? BILIRUBINUR Negative 10/30/2020 1024  ? KETONESUR 5 (A) 12/23/2016 0115  ? PROTEINUR Negative 10/30/2020 1024  ? PROTEINUR 30 (A) 12/23/2016 0115  ? NITRITE Negative 10/30/2020 1024  ? NITRITE POSITIVE (A) 12/23/2016 0115  ? LEUKOCYTESUR Negative 10/30/2020 1024  ? ? ?Lab Results  ?Component Value Date  ? LABMICR See below: 05/01/2020  ? [place] None seen 05/01/2020  ? LABEPIT 0-10 05/01/2020  ? MUCUS Present 05/01/2020  ? BACTERIA Few 05/01/2020  ? ? ?Pertinent Imaging: ?CT 07/16/2021: Images reviewed and discussed with the patient  ?Results for orders placed in visit on 06/25/21 ? ?DG Abd 1 View ? ?Narrative ?CLINICAL DATA:  Kidney stone. ? ?EXAM: ?ABDOMEN - 1 VIEW ? ?COMPARISON:  Radiograph 06/23/2021, CT 06/21/2018 ? ?FINDINGS: ?6 mm  calcification projecting over the lower left kidney most ?consistent with a lower pole calculus. This was previously obscured ?by overlying bowel gas. Many of the additional punctate renal stones ?on CT are not seen. No visualize

## 2021-08-12 ENCOUNTER — Encounter: Payer: Self-pay | Admitting: Urology

## 2021-09-21 DIAGNOSIS — E7800 Pure hypercholesterolemia, unspecified: Secondary | ICD-10-CM | POA: Diagnosis not present

## 2021-09-21 DIAGNOSIS — E119 Type 2 diabetes mellitus without complications: Secondary | ICD-10-CM | POA: Diagnosis not present

## 2021-09-21 DIAGNOSIS — I10 Essential (primary) hypertension: Secondary | ICD-10-CM | POA: Diagnosis not present

## 2021-09-23 DIAGNOSIS — K219 Gastro-esophageal reflux disease without esophagitis: Secondary | ICD-10-CM | POA: Diagnosis not present

## 2021-09-23 DIAGNOSIS — E119 Type 2 diabetes mellitus without complications: Secondary | ICD-10-CM | POA: Diagnosis not present

## 2021-09-23 DIAGNOSIS — E7800 Pure hypercholesterolemia, unspecified: Secondary | ICD-10-CM | POA: Diagnosis not present

## 2021-09-23 DIAGNOSIS — I10 Essential (primary) hypertension: Secondary | ICD-10-CM | POA: Diagnosis not present

## 2021-09-23 DIAGNOSIS — Z7984 Long term (current) use of oral hypoglycemic drugs: Secondary | ICD-10-CM | POA: Diagnosis not present

## 2021-12-07 ENCOUNTER — Telehealth: Payer: Self-pay

## 2021-12-07 NOTE — Chronic Care Management (AMB) (Signed)
  Care Management   Outreach Note  12/07/2021 Name: Austin Gould MRN: [id number] DOB: December 05, 1955  An unsuccessful telephone outreach was attempted today. The patient was referred to the case management team for assistance with care management and care coordination.   Follow Up Plan:  A HIPAA compliant phone message was left for the patient providing contact information and requesting a return call.  The care management team will reach out to the patient again over the next 7 days.  If patient returns call to provider office, please advise to call  Care Management Care Guide Penne Lash * at [phone] Lash, RMA Care Guide [facility]  [place], [place] [address] Direct Dial: [phone] [email or web address]

## 2021-12-30 NOTE — Chronic Care Management (AMB) (Signed)
  Care Coordination  Note  12/30/2021 Name: [name] MRN: [id number] DOB: 17-May-1955  Austin Gould is a 66 y.o. year old male who is a primary care patient of Andrey Campanile Gloriajean Dell, MD. I reached out to Austin Gould by phone today to offer care coordination services.      [name] was given information about Care Coordination services today including:  The Care Coordination services include support from the care team which includes your Nurse Coordinator, Clinical Social Worker, or Pharmacist.  The Care Coordination team is here to help remove barriers to the health concerns and goals most important to you. Care Coordination services are voluntary and the patient may decline or stop services at any time by request to their care team member.   Patient did not agree to participate in care coordination services at this time.  Follow up plan: Patient declines further follow up or participation in care coordination services.   Penne Lash, RMA Care Guide [facility] [place], [place] [address] Direct Dial: [phone] [email or web address]

## 2022-07-19 ENCOUNTER — Ambulatory Visit (HOSPITAL_COMMUNITY): Payer: Medicare Other | Attending: Urology

## 2022-07-26 ENCOUNTER — Telehealth: Payer: Self-pay

## 2022-07-26 NOTE — Telephone Encounter (Signed)
Returned call with no answer, left vm to return call

## 2022-07-26 NOTE — Telephone Encounter (Signed)
Patient's wife needing a call back regarding CT scan being done before March 24.  No availability to schedule patient before May for CT follow up.  Please advise.  Wife, Lynelle Smoke [phone]

## 2022-07-28 NOTE — Telephone Encounter (Signed)
Renal US is scheduled for 3/22. Patients appt. resheduled for 4/19

## 2022-07-29 ENCOUNTER — Ambulatory Visit: Payer: 59 | Admitting: Urology

## 2022-08-05 ENCOUNTER — Ambulatory Visit (HOSPITAL_COMMUNITY)
Admission: RE | Admit: 2022-08-05 | Discharge: 2022-08-05 | Disposition: A | Discharge status: Home / Self Care | Payer: Medicare Other | Source: Ambulatory Visit | Attending: Urology | Admitting: Urology

## 2022-08-05 DIAGNOSIS — N200 Calculus of kidney: Secondary | ICD-10-CM

## 2022-08-17 ENCOUNTER — Other Ambulatory Visit: Payer: Self-pay | Admitting: Urology

## 2022-09-02 ENCOUNTER — Ambulatory Visit: Payer: Medicare Other | Admitting: Urology

## 2022-09-02 VITALS — BP 131/70 | HR 93

## 2022-09-02 DIAGNOSIS — N401 Enlarged prostate with lower urinary tract symptoms: Secondary | ICD-10-CM | POA: Diagnosis not present

## 2022-09-02 DIAGNOSIS — N200 Calculus of kidney: Secondary | ICD-10-CM | POA: Diagnosis not present

## 2022-09-02 DIAGNOSIS — N138 Other obstructive and reflux uropathy: Secondary | ICD-10-CM | POA: Diagnosis not present

## 2022-09-02 DIAGNOSIS — R3912 Poor urinary stream: Secondary | ICD-10-CM | POA: Diagnosis not present

## 2022-09-02 LAB — URINALYSIS, ROUTINE W REFLEX MICROSCOPIC
Bilirubin, UA: NEGATIVE
Ketones, UA: NEGATIVE
Leukocytes,UA: NEGATIVE
Nitrite, UA: NEGATIVE
Protein,UA: NEGATIVE
RBC, UA: NEGATIVE
Specific Gravity, UA: <1.005 — ABNORMAL LOW (ref 1.005–1.030)
Urobilinogen, Ur: 0.2 mg/dL (ref 0.2–1.0)
pH, UA: 5 (ref 5.0–7.5)

## 2022-09-02 MED ORDER — SODIUM BICARBONATE 650 MG PO TABS: 650.0000 mg | ORAL_TABLET | Freq: Two times a day (BID) | ORAL | 3 refills | Status: DC

## 2022-09-02 MED ORDER — TAMSULOSIN HCL 0.4 MG PO CAPS: 0.4000 mg | ORAL_CAPSULE | Freq: Every day | ORAL | 3 refills | Status: DC

## 2022-09-02 MED ORDER — ALLOPURINOL 300 MG PO TABS: 300.0000 mg | ORAL_TABLET | Freq: Every day | ORAL | 11 refills | Status: DC

## 2022-09-02 NOTE — Progress Notes (Unsigned)
09/02/2022 9:40 AM   Austin Gould Apr 29, 1956 [id number]  Referring provider: Barbie Banner, MD [address] [place],  [place] [address]  Followup nephrolithiasis   HPI: Austin Gould is a 67yo here for followup for BPH and nephrolithiasis. No stone events since last visit. Renal US shows stable 5mm left lower pole calculus. He has intermittent flank pain. He is allopurinol and sodium bicarb.  IPSS 0 QOL 0. Urine stream strong   PMH: Past Medical History:  Diagnosis Date   Anemia 12/23/2016   IRON SUPPLEMENT   Bilateral ureteral calculi 10/04/2016   Diabetes mellitus    type 2   GERD (gastroesophageal reflux disease)    PRN MEDS as need   History of kidney stones 10/04/2016   bil. per CT   Hyperlipidemia    Hypertension    Umbilical hernia     Surgical History: Past Surgical History:  Procedure Laterality Date   COLONOSCOPY     CYSTOSCOPY W/ URETERAL STENT PLACEMENT Bilateral 10/04/2016   Procedure: CYSTOSCOPY WITH RETROGRADE PYELOGRAM/BILATERAL URETERAL STENT PLACEMENT;  Surgeon: Ihor Gully, MD;  Location: [facility];  Service: Urology;  Laterality: Bilateral;   CYSTOSCOPY W/ URETERAL STENT PLACEMENT Bilateral 12/23/2016   Procedure: CYSTOSCOPY WITH RETROGRADE PYELOGRAM/URETERAL BILATERAL STENT PLACEMENT;  Surgeon: Malen Gauze, MD;  Location: [facility];  Service: Urology;  Laterality: Bilateral;   CYSTOSCOPY WITH RETROGRADE PYELOGRAM, URETEROSCOPY AND STENT PLACEMENT Bilateral 11/25/2016   Procedure: CYSTOSCOPY BILATERAL STENT REMOVAL  BILATERAL, URETEROSCOPY STONE REMOVAL  AND left  STENT REPLACEMENT with holmium laser;  Surgeon: Ihor Gully, MD;  Location: [facility] ;  Service: Urology;  Laterality: Bilateral;   CYSTOSCOPY WITH RETROGRADE PYELOGRAM, URETEROSCOPY AND STENT PLACEMENT Bilateral 02/13/2017   Procedure: CYSTOSCOPY WITH RETROGRADE PYELOGRAM, URETEROSCOPY STONE BASKETRY AND STENT REPLACEMENT;  Surgeon: Malen Gauze, MD;  Location:  [facility];  Service: Urology;  Laterality: Bilateral;   CYSTOSCOPY WITH URETEROSCOPY  10/19/2011   Procedure: CYSTOSCOPY WITH URETEROSCOPY;  Surgeon: Milford Cage, MD;  Location: [facility];  Service: Urology;  Laterality: Left;  cystoscopy, left ureteroscopy, holmium laser, lithotripsey left ureter stent placement    EXTRACORPOREAL SHOCK WAVE LITHOTRIPSY Left 08/18/2011   EXTRACORPOREAL SHOCK WAVE LITHOTRIPSY Left 10/17/2016   Procedure: LEFT EXTRACORPOREAL SHOCK WAVE LITHOTRIPSY (ESWL);  Surgeon: Sebastian Ache, MD;  Location: [facility];  Service: Urology;  Laterality: Left;   HOLMIUM LASER APPLICATION Bilateral 11/25/2016   Procedure: HOLMIUM LASER APPLICATION;  Surgeon: Ihor Gully, MD;  Location: [facility];  Service: Urology;  Laterality: Bilateral;   HOLMIUM LASER APPLICATION Bilateral 02/13/2017   Procedure: HOLMIUM LASER APPLICATION;  Surgeon: Malen Gauze, MD;  Location: [facility];  Service: Urology;  Laterality: Bilateral;   INSERTION OF MESH N/A 01/08/2018   Procedure: INSERTION OF MESH;  Surgeon: Axel Filler, MD;  Location: [facility];  Service: General;  Laterality: N/A;   UMBILICAL HERNIA REPAIR N/A 01/08/2018   Procedure: LAPAROSCOPIC UMBILICAL HERNIA REPAIR WITH MESH;  Surgeon: Axel Filler, MD;  Location: [facility];  Service: General;  Laterality: N/A;    Home Medications:  Allergies as of 09/02/2022       Reactions   Ciprofloxacin Hives   Sulfa Antibiotics Hives   Sulfamethoxazole-trimethoprim Hives   Ace Inhibitors Other (See Comments)   unknown   Clarithromycin Other (See Comments)   unknown        Medication List        Accurate as of  September 02, 2022  9:40 AM. If you have any questions, ask your nurse or doctor.          allopurinol 300 MG tablet Commonly known as: ZYLOPRIM TAKE 1 TABLET BY MOUTH EVERY DAY   aspirin EC 81 MG tablet Take 81 mg by mouth daily.   atorvastatin 40  MG tablet Commonly known as: LIPITOR Take 40 mg by mouth every morning.   esomeprazole 40 MG capsule Commonly known as: NEXIUM Take 40 mg by mouth daily as needed (reflux).   famotidine 20 MG tablet Commonly known as: PEPCID Take 20 mg by mouth 2 (two) times daily as needed for heartburn. Heart burn   Fish Oil 1000 MG Cpdr Take 1,000 mg by mouth 2 (two) times daily.   GARLIC PO Take 1 capsule by mouth 2 (two) times daily.   Januvia 100 MG tablet Generic drug: sitaGLIPtin Take 100 mg by mouth daily.   metFORMIN 1000 MG tablet Commonly known as: GLUCOPHAGE Take 1,000 mg by mouth 2 (two) times daily with a meal.   olmesartan 40 MG tablet Commonly known as: BENICAR Take 40 mg by mouth every evening.   oxyCODONE-acetaminophen 5-325 MG tablet Commonly known as: Percocet Take 1 tablet by mouth every 4 (four) hours as needed.   pioglitazone 15 MG tablet Commonly known as: ACTOS Take 15 mg by mouth daily.   pioglitazone 45 MG tablet Commonly known as: ACTOS Take 45 mg by mouth daily.   sodium bicarbonate 650 MG tablet Take 1 tablet (650 mg total) by mouth 2 (two) times daily.   tamsulosin 0.4 MG Caps capsule Commonly known as: FLOMAX Take 1 capsule (0.4 mg total) by mouth at bedtime.   telmisartan 80 MG tablet Commonly known as: MICARDIS        Allergies:  Allergies  Allergen Reactions   Ciprofloxacin Hives   Sulfa Antibiotics Hives   Sulfamethoxazole-Trimethoprim Hives   Ace Inhibitors Other (See Comments)    unknown   Clarithromycin Other (See Comments)    unknown    Family History: No family history on file.  Social History:  reports that he has quit smoking. His smoking use included cigarettes. He has never used smokeless tobacco. He reports that he does not drink alcohol and does not use drugs.  ROS: All other review of systems were reviewed and are negative except what is noted above in HPI  Physical Exam: BP 131/70   Pulse 93    Constitutional:  Alert and oriented, No acute distress. HEENT: [place] AT, moist mucus membranes.  Trachea midline, no masses. Cardiovascular: No clubbing, cyanosis, or edema. Respiratory: Normal respiratory effort, no increased work of breathing. GI: Abdomen is soft, nontender, nondistended, no abdominal masses GU: No CVA tenderness.  Lymph: No cervical or inguinal lymphadenopathy. Skin: No rashes, bruises or suspicious lesions. Neurologic: Grossly intact, no focal deficits, moving all 4 extremities. Psychiatric: Normal mood and affect.  Laboratory Data: Lab Results  Component Value Date   WBC 9.1 12/29/2017   HGB 14.2 12/29/2017   HCT 46.8 12/29/2017   MCV 89.0 12/29/2017   PLT 297 12/29/2017    Lab Results  Component Value Date   CREATININE 1.26 (H) 12/29/2017    No results found for: "PSA"  No results found for: "TESTOSTERONE"  Lab Results  Component Value Date   HGBA1C 9.8 (H) 12/29/2017    Urinalysis    Component Value Date/Time   COLORURINE YELLOW 12/23/2016 0115   APPEARANCEUR Clear 08/06/2021 1120   LABSPEC  1.015 12/23/2016 0115   PHURINE 5.0 12/23/2016 0115   GLUCOSEU Negative 08/06/2021 1120   HGBUR MODERATE (A) 12/23/2016 0115   BILIRUBINUR Negative 08/06/2021 1120   KETONESUR 5 (A) 12/23/2016 0115   PROTEINUR Negative 08/06/2021 1120   PROTEINUR 30 (A) 12/23/2016 0115   NITRITE Negative 08/06/2021 1120   NITRITE POSITIVE (A) 12/23/2016 0115   LEUKOCYTESUR Negative 08/06/2021 1120    Lab Results  Component Value Date   LABMICR Comment 08/06/2021   WBCUA None seen 05/01/2020   LABEPIT 0-10 05/01/2020   MUCUS Present 05/01/2020   BACTERIA Few 05/01/2020    Pertinent Imaging: Renal US 08/05/2022: Images reviewed and discussed with the patient  Results for orders placed in visit on 06/25/21  DG Abd 1 View  Narrative CLINICAL DATA:  Kidney stone.  EXAM: ABDOMEN - 1 VIEW  COMPARISON:  Radiograph 06/23/2021, CT 06/21/2018  FINDINGS: 6 mm  calcification projecting over the lower left kidney most consistent with a lower pole calculus. This was previously obscured by overlying bowel gas. Many of the additional punctate renal stones on CT are not seen. No visualized right urolithiasis. Normal bowel gas pattern with moderate colonic stool burden. Included lung bases are clear. No acute osseous findings.  IMPRESSION: A 6 mm calcification projecting over the lower left kidney most consistent with a lower pole calculus. Many of the additional punctate nonobstructing stones on prior CT are not seen by radiograph.   Electronically Signed By: Narda Rutherford M.D. On: 07/14/2021 16:29  No results found for this or any previous visit.  No results found for this or any previous visit.  No results found for this or any previous visit.  Results for orders placed during the hospital encounter of 08/05/22  Ultrasound renal complete  Narrative CLINICAL DATA:  Nephrolithiasis follow-up  EXAM: RENAL / URINARY TRACT ULTRASOUND COMPLETE  COMPARISON:  Renal ultrasound June 21, 2021  FINDINGS: Right Kidney:  Renal measurements: 11.3 x 4.7 x 5.9 cm = volume: 162 mL. Echogenicity within normal limits. No mass or hydronephrosis visualized.  Left Kidney:  Renal measurements: 12.5 x 5.4 x 5.0 cm = volume: 178 mL. Contains a 5 mm stone. Contains 11 mm cyst. No follow-up imaging recommended for the cyst.  Bladder:  Appears normal for degree of bladder distention.  Other:  None.  IMPRESSION: 5 mm stone in the left kidney. No other abnormalities.   Electronically Signed By: Gerome Sam III M.D. On: 08/05/2022 16:45  No valid procedures specified. No results found for this or any previous visit.  Results for orders placed during the hospital encounter of 07/16/21  CT RENAL STONE STUDY  Narrative CLINICAL DATA:  Left flank pain with 1 week of microscopic hematuria.  EXAM: CT ABDOMEN AND PELVIS WITHOUT  CONTRAST  TECHNIQUE: Multidetector CT imaging of the abdomen and pelvis was performed following the standard protocol without IV contrast.  RADIATION DOSE REDUCTION: This exam was performed according to the departmental dose-optimization program which includes automated exposure control, adjustment of the mA and/or kV according to patient size and/or use of iterative reconstruction technique.  COMPARISON:  June 21, 2020  FINDINGS: Lower chest: No acute abnormality.  Hepatobiliary: Unremarkable noncontrast appearance of the hepatic parenchyma. Gallbladder is unremarkable. No biliary ductal dilation.  Pancreas: No pancreatic ductal dilation or evidence of acute inflammation.  Spleen: No splenomegaly.  Adrenals/Urinary Tract: Bilateral adrenal glands appear normal. Nonobstructive 5 mm left lower pole renal stone. Similar mild, likely chronic, left caliectasis without pelviectasis or ureteral  dilation. No hydronephrosis. Nondistended urinary bladder. No obstructive ureteral or bladder calculi.  Stomach/Bowel: No enteric contrast was administered. Stomach is minimally distended without abnormal wall thickening identified. No pathologic dilation of small or large bowel. The appendix and terminal ileum appear normal. No evidence of acute bowel inflammation.  Vascular/Lymphatic: Scattered aortic atherosclerosis without aneurysmal dilation. No pathologically enlarged abdominal or pelvic lymph nodes.  Reproductive: Similar mildly enlarged prostate gland.  Other: No significant abdominopelvic free fluid. No pneumoperitoneum.  Musculoskeletal: No acute abnormality. Multilevel degenerative changes spine. Stable scattered bone islands in the pelvic girdle and lumbar spine.  IMPRESSION: 1. Nonobstructive 5 mm left lower pole renal stone. No obstructive ureteral or bladder calculi identified. 2. Similar mild, likely chronic, left caliectasis without pelviectasis or ureteral  dilation. 3. Mildly enlarged prostate gland. 4.  Aortic Atherosclerosis (ICD10-I70.0).   Electronically Signed By: Maudry Mayhew M.D. On: 07/18/2021 09:07   Assessment & Plan:    1. Calculus, renal Continue sodium bicarb  BID, continue allopurinol -followup 1 year with renal US - Urinalysis, Routine w reflex microscopic  2. Benign prostatic hyperplasia with urinary obstruction -floma x0.4mg  daily  3. Weak urinary stream Flomax 0.4mg  daily   No follow-ups on file.  Wilkie Aye, MD  [facility] [place]

## 2022-09-06 ENCOUNTER — Encounter: Payer: Self-pay | Admitting: Urology

## 2022-09-06 NOTE — Patient Instructions (Signed)

## 2022-09-20 ENCOUNTER — Ambulatory Visit: Payer: 59 | Admitting: Urology

## 2022-10-17 IMAGING — DX DG ABDOMEN 1V
2 series · 2 of 2 positions shown · non-contrast
Comparison: Radiograph 06/23/2021, CT 06/21/2018

CLINICAL DATA: Kidney stone.

EXAM:
ABDOMEN - 1 VIEW

[abdomen kub (1 of 2)]
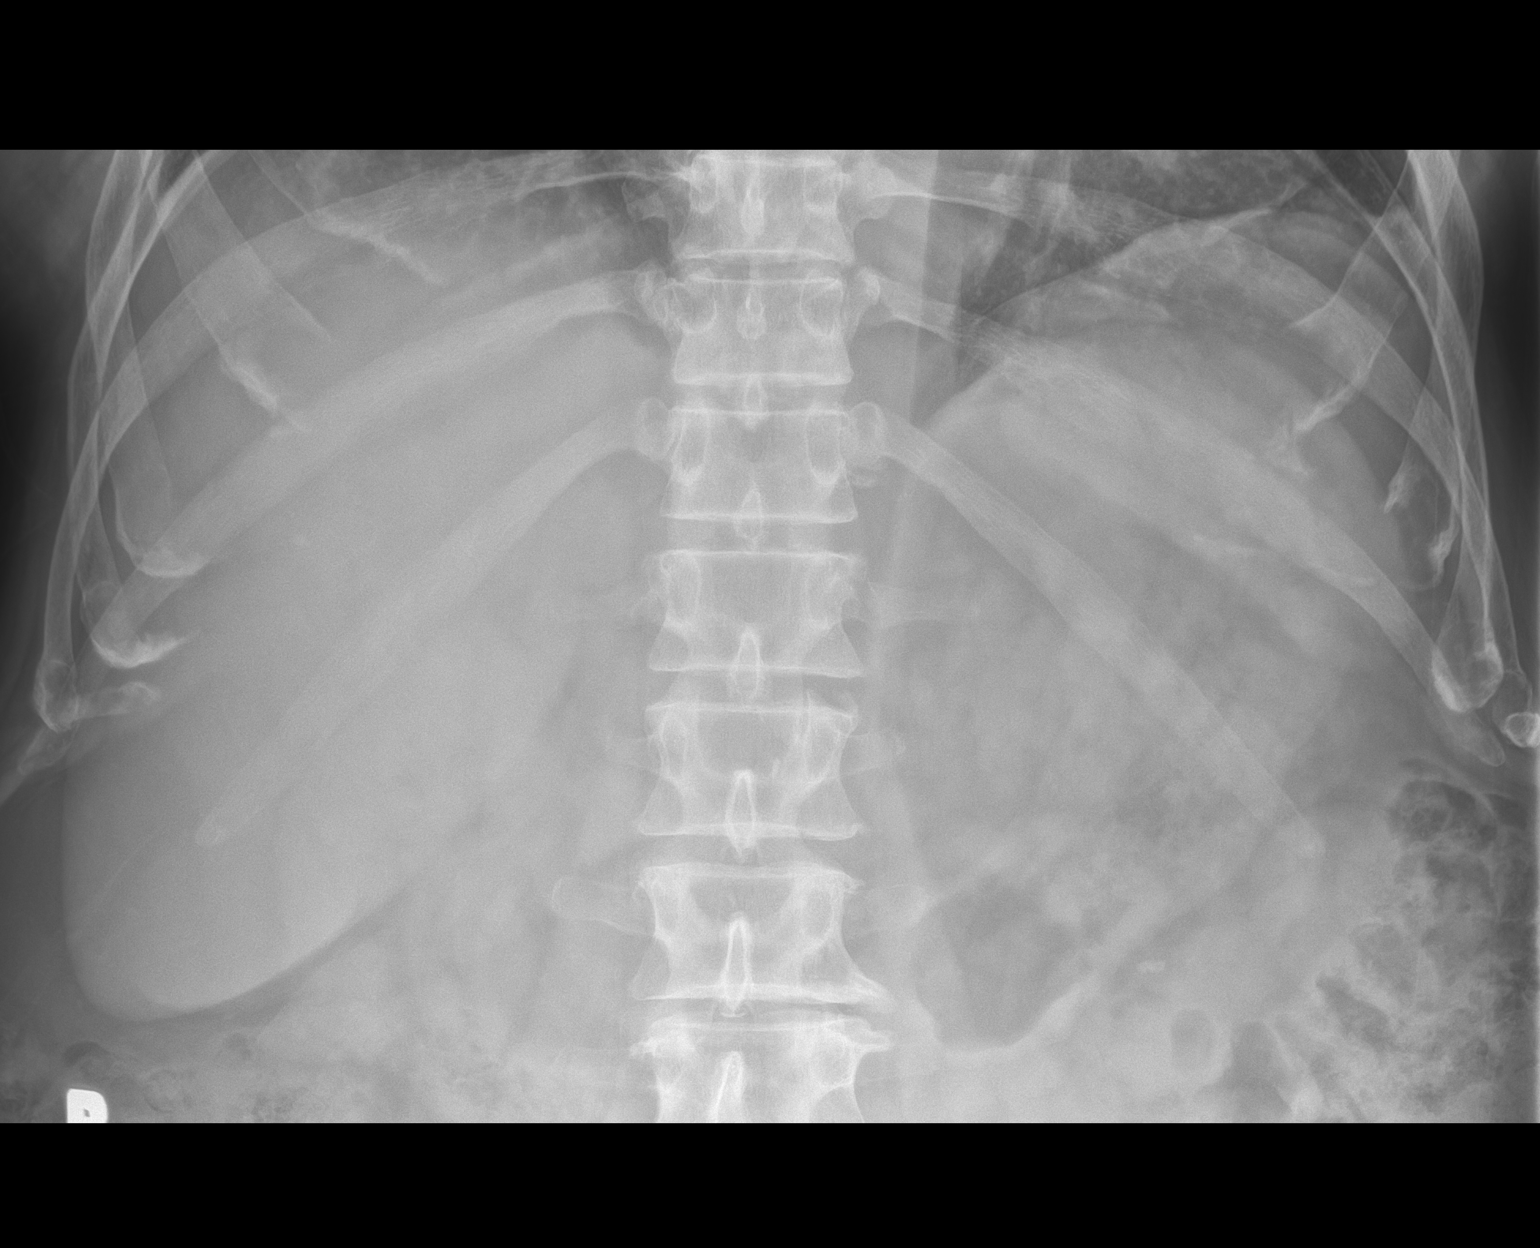

[abdomen kub (2 of 2)]
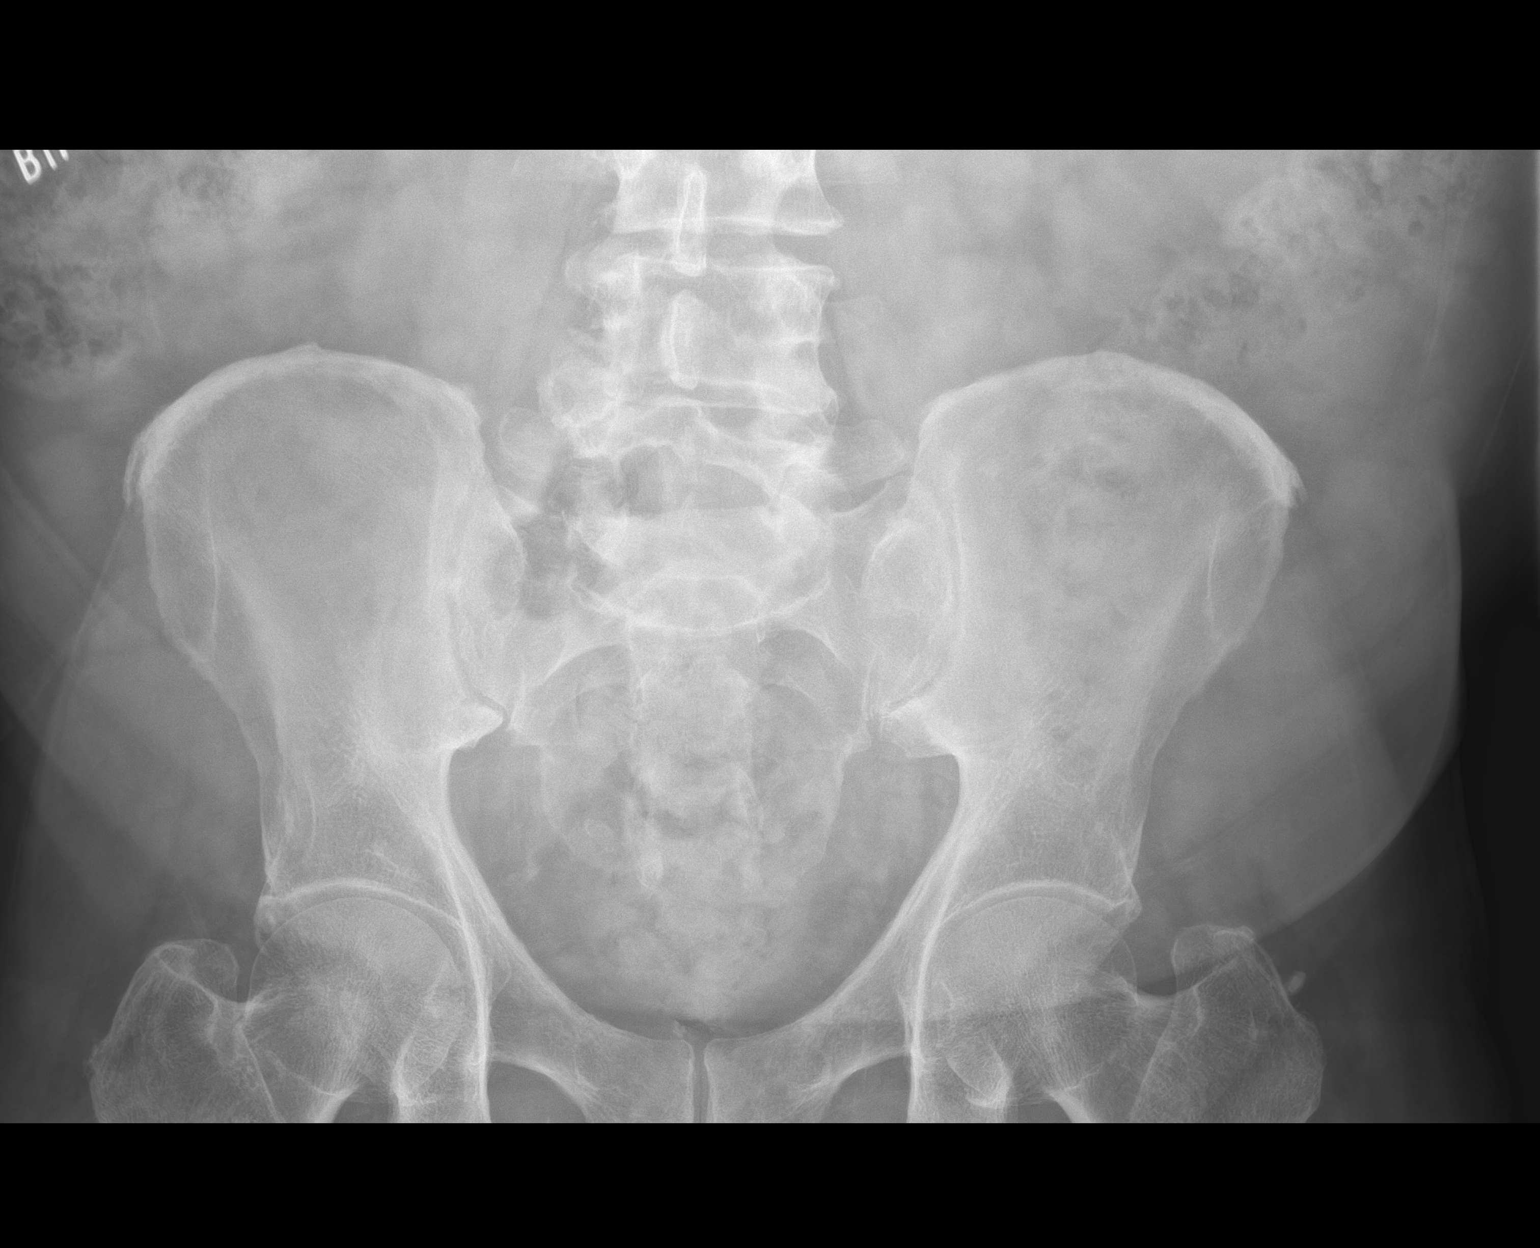

[2 of 2 positions shown; findings below may reference images not displayed]

FINDINGS: 6 mm calcification projecting over the lower left kidney most
consistent with a lower pole calculus. This was previously obscured
by overlying bowel gas. Many of the additional punctate renal stones
on CT are not seen. No visualized right urolithiasis. Normal bowel
gas pattern with moderate colonic stool burden. Included lung bases
are clear. No acute osseous findings.
IMPRESSION: A 6 mm calcification projecting over the lower left kidney most
consistent with a lower pole calculus. Many of the additional
punctate nonobstructing stones on prior CT are not seen by
radiograph.

## 2023-08-22 ENCOUNTER — Ambulatory Visit (HOSPITAL_COMMUNITY)
Admission: RE | Admit: 2023-08-22 | Discharge: 2023-08-22 | Disposition: A | Discharge status: Home / Self Care | Source: Ambulatory Visit | Attending: Urology | Admitting: Urology

## 2023-08-22 DIAGNOSIS — N200 Calculus of kidney: Secondary | ICD-10-CM | POA: Insufficient documentation

## 2023-09-05 ENCOUNTER — Other Ambulatory Visit: Payer: Self-pay | Admitting: Urology

## 2023-09-05 NOTE — Progress Notes (Signed)
 Letter sent.

## 2023-09-06 ENCOUNTER — Telehealth: Payer: Self-pay | Admitting: Urology

## 2023-09-06 ENCOUNTER — Other Ambulatory Visit: Payer: Self-pay

## 2023-09-06 DIAGNOSIS — N200 Calculus of kidney: Secondary | ICD-10-CM

## 2023-09-06 MED ORDER — ALLOPURINOL 300 MG PO TABS: 300.0000 mg | ORAL_TABLET | Freq: Every day | ORAL | 0 refills | Status: AC

## 2023-09-06 NOTE — Telephone Encounter (Signed)
 wife notified that Rx has been sent in

## 2023-09-06 NOTE — Telephone Encounter (Signed)
 Patient needs refill of allopurinol  sent to [facility].

## 2023-09-08 ENCOUNTER — Ambulatory Visit: Payer: Medicare Other | Admitting: Urology

## 2023-09-08 VITALS — BP 152/67 | HR 103

## 2023-09-08 DIAGNOSIS — R3912 Poor urinary stream: Secondary | ICD-10-CM

## 2023-09-08 DIAGNOSIS — N401 Enlarged prostate with lower urinary tract symptoms: Secondary | ICD-10-CM

## 2023-09-08 DIAGNOSIS — Z87442 Personal history of urinary calculi: Secondary | ICD-10-CM | POA: Diagnosis not present

## 2023-09-08 DIAGNOSIS — N200 Calculus of kidney: Secondary | ICD-10-CM

## 2023-09-08 DIAGNOSIS — N138 Other obstructive and reflux uropathy: Secondary | ICD-10-CM

## 2023-09-08 LAB — URINALYSIS, ROUTINE W REFLEX MICROSCOPIC
Bilirubin, UA: NEGATIVE
Ketones, UA: NEGATIVE
Leukocytes,UA: NEGATIVE
Nitrite, UA: NEGATIVE
Protein,UA: NEGATIVE
RBC, UA: NEGATIVE
Specific Gravity, UA: 1.015 (ref 1.005–1.030)
Urobilinogen, Ur: 0.2 mg/dL (ref 0.2–1.0)
pH, UA: 6 (ref 5.0–7.5)

## 2023-09-08 MED ORDER — SODIUM BICARBONATE 650 MG PO TABS: 650.0000 mg | ORAL_TABLET | Freq: Two times a day (BID) | ORAL | 3 refills | Status: AC

## 2023-09-08 MED ORDER — TAMSULOSIN HCL 0.4 MG PO CAPS: 0.4000 mg | ORAL_CAPSULE | Freq: Every day | ORAL | 3 refills | Status: AC

## 2023-09-08 NOTE — Progress Notes (Signed)
 09/08/2023 9:47 AM   Austin Gould July 21, 1955 [id number]  Referring provider: Tura Gaines, MD [id number]  [place] [address]  nephrolithiasis   HPI: Mr Austin Gould is a 68yo here for followup for BPh and nephrolithiasis. NO stone events since last visit. Renal US  shows no calculi. He remains on sodium bicarb 650mg  BID. IPSS 2 QOl 0 on flomax  0.4mg . He was started on jardiance 2-3 weeks ago. Nocturia 1x.    PMH: Past Medical History:  Diagnosis Date   Anemia 12/23/2016   IRON SUPPLEMENT   Bilateral ureteral calculi 10/04/2016   Diabetes mellitus    type 2   GERD (gastroesophageal reflux disease)    PRN MEDS as need   History of kidney stones 10/04/2016   bil. per CT   Hyperlipidemia    Hypertension    Umbilical hernia     Surgical History: Past Surgical History:  Procedure Laterality Date   COLONOSCOPY     CYSTOSCOPY W/ URETERAL STENT PLACEMENT Bilateral 10/04/2016   Procedure: CYSTOSCOPY WITH RETROGRADE PYELOGRAM/BILATERAL URETERAL STENT PLACEMENT;  Surgeon: Austin Gould, Mark, MD;  Location: [facility];  Service: Urology;  Laterality: Bilateral;   CYSTOSCOPY W/ URETERAL STENT PLACEMENT Bilateral 12/23/2016   Procedure: CYSTOSCOPY WITH RETROGRADE PYELOGRAM/URETERAL BILATERAL STENT PLACEMENT;  Surgeon: Austin Severs, MD;  Location: [facility];  Service: Urology;  Laterality: Bilateral;   CYSTOSCOPY WITH RETROGRADE PYELOGRAM, URETEROSCOPY AND STENT PLACEMENT Bilateral 11/25/2016   Procedure: CYSTOSCOPY BILATERAL STENT REMOVAL  BILATERAL, URETEROSCOPY STONE REMOVAL  AND left  STENT REPLACEMENT with holmium laser;  Surgeon: Austin Gould, Mark, MD;  Location: [facility];  Service: Urology;  Laterality: Bilateral;   CYSTOSCOPY WITH RETROGRADE PYELOGRAM, URETEROSCOPY AND STENT PLACEMENT Bilateral 02/13/2017   Procedure: CYSTOSCOPY WITH RETROGRADE PYELOGRAM, URETEROSCOPY STONE BASKETRY AND STENT REPLACEMENT;  Surgeon: Austin Severs, MD;  Location: [facility];  Service: Urology;  Laterality: Bilateral;   CYSTOSCOPY WITH URETEROSCOPY  10/19/2011   Procedure: CYSTOSCOPY WITH URETEROSCOPY;  Surgeon: Austin Dupes, MD;  Location[facility];  Service: Urology;  Laterality: Left;  cystoscopy, left ureteroscopy, holmium laser, lithotripsey left ureter stent placement    EXTRACORPOREAL SHOCK WAVE LITHOTRIPSY Left 08/18/2011   EXTRACORPOREAL SHOCK WAVE LITHOTRIPSY Left 10/17/2016   Procedure: LEFT EXTRACORPOREAL SHOCK WAVE LITHOTRIPSY (ESWL);  Surgeon: Austin Blaze, MD;  Location: [facility];  Service: Urology;  Laterality: Left;   HOLMIUM LASER APPLICATION Bilateral 11/25/2016   Procedure: HOLMIUM LASER APPLICATION;  Surgeon: Austin Gould, Mark, MD;  Location: [facility];  Service: Urology;  Laterality: Bilateral;   HOLMIUM LASER APPLICATION Bilateral 02/13/2017   Procedure: HOLMIUM LASER APPLICATION;  Surgeon: Austin Severs, MD;  Location: [facility];  Service: Urology;  Laterality: Bilateral;   INSERTION OF MESH N/A 01/08/2018   Procedure: INSERTION OF MESH;  Surgeon: Austin Derby, MD;  Location: [facility];  Service: General;  Laterality: N/A;   UMBILICAL HERNIA REPAIR N/A 01/08/2018   Procedure: LAPAROSCOPIC UMBILICAL HERNIA REPAIR WITH MESH;  Surgeon: Austin Derby, MD;  Location: [facility];  Service: General;  Laterality: N/A;    Home Medications:  Allergies as of 09/08/2023       Reactions   Ciprofloxacin Hives   Sulfa Antibiotics Hives   Sulfamethoxazole-trimethoprim Hives   Ace Inhibitors Other (See Comments)   unknown   Clarithromycin Other (See Comments)   unknown        Medication List        Accurate as  of September 08, 2023  9:47 AM. If you have any questions, ask your nurse or doctor.          allopurinol  300 MG tablet Commonly known as: ZYLOPRIM  Take 1 tablet (300 mg total) by mouth daily.   aspirin  EC 81 MG tablet Take 81 mg by mouth daily.   atorvastatin  40  MG tablet Commonly known as: LIPITOR Take 40 mg by mouth every morning.   esomeprazole 40 MG capsule Commonly known as: NEXIUM Take 40 mg by mouth daily as needed (reflux).   famotidine 20 MG tablet Commonly known as: PEPCID Take 20 mg by mouth 2 (two) times daily as needed for heartburn. Heart burn   Fish Oil 1000 MG Cpdr Take 1,000 mg by mouth 2 (two) times daily.   GARLIC PO Take 1 capsule by mouth 2 (two) times daily.   Januvia 100 MG tablet Generic drug: sitaGLIPtin Take 100 mg by mouth daily.   metFORMIN 1000 MG tablet Commonly known as: GLUCOPHAGE Take 1,000 mg by mouth 2 (two) times daily with a meal.   olmesartan 40 MG tablet Commonly known as: BENICAR Take 40 mg by mouth every evening.   oxyCODONE -acetaminophen  5-325 MG tablet Commonly known as: Percocet Take 1 tablet by mouth every 4 (four) hours as needed.   pioglitazone 15 MG tablet Commonly known as: ACTOS Take 15 mg by mouth daily.   pioglitazone 45 MG tablet Commonly known as: ACTOS Take 45 mg by mouth daily.   sodium bicarbonate  650 MG tablet Take 1 tablet (650 mg total) by mouth 2 (two) times daily.   tamsulosin  0.4 MG Caps capsule Commonly known as: FLOMAX  Take 1 capsule (0.4 mg total) by mouth at bedtime.   telmisartan 80 MG tablet Commonly known as: MICARDIS        Allergies:  Allergies  Allergen Reactions   Ciprofloxacin Hives   Sulfa Antibiotics Hives   Sulfamethoxazole-Trimethoprim Hives   Ace Inhibitors Other (See Comments)    unknown   Clarithromycin Other (See Comments)    unknown    Family History: No family history on file.  Social History:  reports that he has quit smoking. His smoking use included cigarettes. He has never used smokeless tobacco. He reports that he does not drink alcohol and does not use drugs.  ROS: All other review of systems were reviewed and are negative except what is noted above in HPI  Physical Exam: BP (!) 152/67   Pulse (!) 103    Constitutional:  Alert and oriented, No acute distress. HEENT: [place] AT, moist mucus membranes.  Trachea midline, no masses. Cardiovascular: No clubbing, cyanosis, or edema. Respiratory: Normal respiratory effort, no increased work of breathing. GI: Abdomen is soft, nontender, nondistended, no abdominal masses GU: No CVA tenderness.  Lymph: No cervical or inguinal lymphadenopathy. Skin: No rashes, bruises or suspicious lesions. Neurologic: Grossly intact, no focal deficits, moving all 4 extremities. Psychiatric: Normal mood and affect.  Laboratory Data: Lab Results  Component Value Date   WBC 9.1 12/29/2017   HGB 14.2 12/29/2017   HCT 46.8 12/29/2017   MCV 89.0 12/29/2017   PLT 297 12/29/2017    Lab Results  Component Value Date   CREATININE 1.26 (H) 12/29/2017    No results found for: "PSA"  No results found for: "TESTOSTERONE"  Lab Results  Component Value Date   HGBA1C 9.8 (H) 12/29/2017    Urinalysis    Component Value Date/Time   COLORURINE YELLOW 12/23/2016 0115   APPEARANCEUR Clear  09/02/2022 0953   LABSPEC 1.015 12/23/2016 0115   PHURINE 5.0 12/23/2016 0115   GLUCOSEU 3+ (A) 09/02/2022 0953   HGBUR MODERATE (A) 12/23/2016 0115   BILIRUBINUR Negative 09/02/2022 0953   KETONESUR 5 (A) 12/23/2016 0115   PROTEINUR Negative 09/02/2022 0953   PROTEINUR 30 (A) 12/23/2016 0115   NITRITE Negative 09/02/2022 0953   NITRITE POSITIVE (A) 12/23/2016 0115   LEUKOCYTESUR Negative 09/02/2022 0953    Lab Results  Component Value Date   LABMICR Comment 09/02/2022   WBCUA None seen 05/01/2020   LABEPIT 0-10 05/01/2020   MUCUS Present 05/01/2020   BACTERIA Few 05/01/2020    Pertinent Imaging: Renal US  08/22/2023: Images reviewed and discussed with the patient  Results for orders placed in visit on 06/25/21  DG Abd 1 View  Narrative CLINICAL DATA:  Kidney stone.  EXAM: ABDOMEN - 1 VIEW  COMPARISON:  Radiograph 06/23/2021, CT 06/21/2018  FINDINGS: 6 mm  calcification projecting over the lower left kidney most consistent with a lower pole calculus. This was previously obscured by overlying bowel gas. Many of the additional punctate renal stones on CT are not seen. No visualized right urolithiasis. Normal bowel gas pattern with moderate colonic stool burden. Included lung bases are clear. No acute osseous findings.  IMPRESSION: A 6 mm calcification projecting over the lower left kidney most consistent with a lower pole calculus. Many of the additional punctate nonobstructing stones on prior CT are not seen by radiograph.   Electronically Signed By: Chadwick Colonel M.D. On: 07/14/2021 16:29  No results found for this or any previous visit.  No results found for this or any previous visit.  No results found for this or any previous visit.  Results for orders placed during the hospital encounter of 08/22/23  Ultrasound renal complete  Narrative CLINICAL DATA:  Nephrolithiasis  EXAM: RENAL / URINARY TRACT ULTRASOUND COMPLETE  COMPARISON:  08/05/2022  FINDINGS: Right Kidney:  Renal measurements: 11.2 cm = volume: 210 mL. Echogenicity within normal limits. No mass or hydronephrosis visualized.  Left Kidney:  Renal measurements: 11.5 cm = volume: 175 mL. Echogenicity within normal limits. No mass or hydronephrosis visualized. 1.2 cm simple cyst in the lower pole does not require dedicated imaging follow-up.  Bladder:  Appears normal for degree of bladder distention.  Other:  Diffuse increased echogenicity of the visualized portions of the hepatic parenchyma are a nonspecific indicator of hepatocellular dysfunction, most commonly steatosis.  IMPRESSION: 1. No hydronephrosis. 2. No sonographically evident renal calculi.   Electronically Signed By: Elester Grim M.D. On: 08/22/2023 12:36  No results found for this or any previous visit.  No results found for this or any previous visit.  Results for orders  placed during the hospital encounter of 07/16/21  CT RENAL STONE STUDY  Narrative CLINICAL DATA:  Left flank pain with 1 week of microscopic hematuria.  EXAM: CT ABDOMEN AND PELVIS WITHOUT CONTRAST  TECHNIQUE: Multidetector CT imaging of the abdomen and pelvis was performed following the standard protocol without IV contrast.  RADIATION DOSE REDUCTION: This exam was performed according to the departmental dose-optimization program which includes automated exposure control, adjustment of the mA and/or kV according to patient size and/or use of iterative reconstruction technique.  COMPARISON:  June 21, 2020  FINDINGS: Lower chest: No acute abnormality.  Hepatobiliary: Unremarkable noncontrast appearance of the hepatic parenchyma. Gallbladder is unremarkable. No biliary ductal dilation.  Pancreas: No pancreatic ductal dilation or evidence of acute inflammation.  Spleen: No splenomegaly.  Adrenals/Urinary  Tract: Bilateral adrenal glands appear normal. Nonobstructive 5 mm left lower pole renal stone. Similar mild, likely chronic, left caliectasis without pelviectasis or ureteral dilation. No hydronephrosis. Nondistended urinary bladder. No obstructive ureteral or bladder calculi.  Stomach/Bowel: No enteric contrast was administered. Stomach is minimally distended without abnormal wall thickening identified. No pathologic dilation of small or large bowel. The appendix and terminal ileum appear normal. No evidence of acute bowel inflammation.  Vascular/Lymphatic: Scattered aortic atherosclerosis without aneurysmal dilation. No pathologically enlarged abdominal or pelvic lymph nodes.  Reproductive: Similar mildly enlarged prostate gland.  Other: No significant abdominopelvic free fluid. No pneumoperitoneum.  Musculoskeletal: No acute abnormality. Multilevel degenerative changes spine. Stable scattered bone islands in the pelvic girdle and lumbar  spine.  IMPRESSION: 1. Nonobstructive 5 mm left lower pole renal stone. No obstructive ureteral or bladder calculi identified. 2. Similar mild, likely chronic, left caliectasis without pelviectasis or ureteral dilation. 3. Mildly enlarged prostate gland. 4.  Aortic Atherosclerosis (ICD10-I70.0).   Electronically Signed By: Tama Fails M.D. On: 07/18/2021 09:07   Assessment & Plan:    1. Calculus, renal (Primary) Continue sodium bicarb Followup 1 year with renal US  - Urinalysis, Routine w reflex microscopic  2. Benign prostatic hyperplasia with urinary obstruction Continue flomax  0.4mg  daily  3. Weak urinary stream Continue flomax  0.4mg  daily   No follow-ups on file.  Johnie Nailer, MD  [facility]

## 2023-09-14 ENCOUNTER — Encounter: Payer: Self-pay | Admitting: Urology

## 2023-09-14 NOTE — Patient Instructions (Signed)

## 2024-08-23 ENCOUNTER — Other Ambulatory Visit (HOSPITAL_COMMUNITY)

## 2024-09-06 ENCOUNTER — Ambulatory Visit: Admitting: Urology
